# Patient Record
Sex: Male | Born: 1995 | Race: Black or African American | Hispanic: No | Marital: Single | Smoking: Current every day smoker
Health system: Southern US, Community
[De-identification: ages and names within clinical notes are randomized; demographics above are authoritative.]

## PROBLEM LIST (undated history)

## (undated) DIAGNOSIS — F319 Bipolar disorder, unspecified: Secondary | ICD-10-CM

---
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
## 2016-05-16 ENCOUNTER — Encounter (HOSPITAL_COMMUNITY): Payer: Self-pay | Admitting: *Deleted

## 2016-05-16 ENCOUNTER — Emergency Department (HOSPITAL_COMMUNITY): Payer: Medicaid Other

## 2016-05-16 ENCOUNTER — Emergency Department (HOSPITAL_COMMUNITY)
Admission: EM | Admit: 2016-05-16 | Discharge: 2016-05-16 | Disposition: A | Discharge status: Home / Self Care | Payer: Medicaid Other | Attending: Emergency Medicine | Admitting: Emergency Medicine

## 2016-05-16 DIAGNOSIS — Y999 Unspecified external cause status: Secondary | ICD-10-CM | POA: Insufficient documentation

## 2016-05-16 DIAGNOSIS — S4991XA Unspecified injury of right shoulder and upper arm, initial encounter: Secondary | ICD-10-CM | POA: Diagnosis present

## 2016-05-16 DIAGNOSIS — S42001A Fracture of unspecified part of right clavicle, initial encounter for closed fracture: Secondary | ICD-10-CM

## 2016-05-16 DIAGNOSIS — Y929 Unspecified place or not applicable: Secondary | ICD-10-CM | POA: Diagnosis not present

## 2016-05-16 DIAGNOSIS — W1830XA Fall on same level, unspecified, initial encounter: Secondary | ICD-10-CM | POA: Diagnosis not present

## 2016-05-16 DIAGNOSIS — F17210 Nicotine dependence, cigarettes, uncomplicated: Secondary | ICD-10-CM | POA: Diagnosis not present

## 2016-05-16 DIAGNOSIS — S42011A Anterior displaced fracture of sternal end of right clavicle, initial encounter for closed fracture: Secondary | ICD-10-CM | POA: Diagnosis not present

## 2016-05-16 DIAGNOSIS — Y9389 Activity, other specified: Secondary | ICD-10-CM | POA: Insufficient documentation

## 2016-05-16 MED ORDER — IBUPROFEN 600 MG PO TABS: 600.0000 mg | ORAL_TABLET | Freq: Four times a day (QID) | ORAL | Status: DC | PRN

## 2016-05-16 MED ORDER — HYDROCODONE-ACETAMINOPHEN 5-325 MG PO TABS: 1.0000 | ORAL_TABLET | ORAL | Status: DC | PRN

## 2016-05-16 MED ORDER — OXYCODONE-ACETAMINOPHEN 5-325 MG PO TABS
1.0000 | ORAL_TABLET | ORAL | Status: DC | PRN
  Administered 2016-05-16: 1 via ORAL

## 2016-05-16 MED ORDER — OXYCODONE-ACETAMINOPHEN 5-325 MG PO TABS
ORAL_TABLET | ORAL | Status: AC
  Filled 2016-05-16: qty 1

## 2016-05-16 NOTE — ED Notes (Signed)
N Nadeau, PA, in w/pt. 

## 2016-05-16 NOTE — ED Notes (Signed)
Sitting on stretcher - no distress noted.
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
## 2016-05-16 NOTE — Discharge Instructions (Signed)
Take your medication as prescribed as needed for pain. I recommend resting, leading your right arm and the arm sling until follow-up with orthopedics and applying ice to affected area for 15-20 minutes 3-4 times daily to help with pain and swelling. Follow-up with the orthopedic office listed above within the next 3-5 days for follow-up. Return to the emergency department if symptoms worsen or new onset of fever, redness, swelling, warmth, numbness, tingling, weakness.

## 2017-08-16 ENCOUNTER — Emergency Department (HOSPITAL_COMMUNITY)
Admission: EM | Admit: 2017-08-16 | Discharge: 2017-08-16 | Disposition: A | Discharge status: Home / Self Care | Payer: Medicaid Other | Attending: Emergency Medicine | Admitting: Emergency Medicine

## 2017-08-16 DIAGNOSIS — S61210A Laceration without foreign body of right index finger without damage to nail, initial encounter: Secondary | ICD-10-CM | POA: Diagnosis not present

## 2017-08-16 DIAGNOSIS — S64498A Injury of digital nerve of other finger, initial encounter: Secondary | ICD-10-CM

## 2017-08-16 DIAGNOSIS — S56129A Laceration of flexor muscle, fascia and tendon of unspecified finger at forearm level, initial encounter: Secondary | ICD-10-CM

## 2017-08-16 DIAGNOSIS — F17210 Nicotine dependence, cigarettes, uncomplicated: Secondary | ICD-10-CM | POA: Insufficient documentation

## 2017-08-16 DIAGNOSIS — W271XXA Contact with garden tool, initial encounter: Secondary | ICD-10-CM | POA: Insufficient documentation

## 2017-08-16 DIAGNOSIS — Y999 Unspecified external cause status: Secondary | ICD-10-CM | POA: Insufficient documentation

## 2017-08-16 DIAGNOSIS — S66120A Laceration of flexor muscle, fascia and tendon of right index finger at wrist and hand level, initial encounter: Secondary | ICD-10-CM | POA: Insufficient documentation

## 2017-08-16 DIAGNOSIS — Y929 Unspecified place or not applicable: Secondary | ICD-10-CM | POA: Insufficient documentation

## 2017-08-16 DIAGNOSIS — S64490A Injury of digital nerve of right index finger, initial encounter: Secondary | ICD-10-CM | POA: Diagnosis not present

## 2017-08-16 DIAGNOSIS — S60940A Unspecified superficial injury of right index finger, initial encounter: Secondary | ICD-10-CM | POA: Diagnosis present

## 2017-08-16 DIAGNOSIS — Y9389 Activity, other specified: Secondary | ICD-10-CM | POA: Insufficient documentation

## 2017-08-16 DIAGNOSIS — S61209A Unspecified open wound of unspecified finger without damage to nail, initial encounter: Secondary | ICD-10-CM

## 2017-08-16 MED ORDER — CEPHALEXIN 500 MG PO CAPS: 500.0000 mg | ORAL_CAPSULE | Freq: Three times a day (TID) | ORAL | 0 refills | Status: DC

## 2017-08-16 MED ORDER — TRAMADOL HCL 50 MG PO TABS: 50.0000 mg | ORAL_TABLET | Freq: Four times a day (QID) | ORAL | 0 refills | Status: DC | PRN

## 2017-08-16 MED ORDER — IBUPROFEN 600 MG PO TABS: 600.0000 mg | ORAL_TABLET | Freq: Four times a day (QID) | ORAL | 0 refills | Status: DC | PRN

## 2017-08-16 NOTE — Discharge Instructions (Signed)
See Dr Melvyn Novas this week.

## 2017-08-16 NOTE — ED Triage Notes (Signed)
Pt cut his finger 3 days ago on a blade while he wa scarping concrete off of equipment, pts finger is swollen just below the knuckle.

## 2017-08-16 NOTE — ED Provider Notes (Signed)
[facility] EMERGENCY DEPARTMENT Provider Note   CSN: [id number] Arrival date & time: 08/16/17  1449     History   Chief Complaint Chief Complaint  Patient presents with  . Finger Injury    HPI Jeremy [name] is a 21 y.o. male.  HPI  21 year old male with finger laceration. Right index finger. Happened 3 days ago. He is cleaning asphalt off the shovel with metal scraper. It slipped and he cut his finger. He is presenting today because of continued pain. Is having difficulty moving his finger distal to the injury. Reports some decreased sensation. No drainage. No fevers or chills. No swelling.  Past Medical History:  Diagnosis Date  . Bipolar 1 disorder [facility])     Patient Active Problem List   Diagnosis Date Noted  . Acute blood loss anemia 06/29/2014  . Arm laceration with complication 06/28/2014    Past Surgical History:  Procedure Laterality Date  . ARTERY REPAIR Left 06/28/2014   Procedure: BRACHIAL ARTERY EXPLORATION;  Surgeon: Nada Libman, MD;  Location: [facility] OR;  Service: Vascular;  Laterality: Left;       Home Medications    Prior to Admission medications   Medication Sig Start Date End Date Taking? Authorizing Provider  HYDROcodone-acetaminophen (NORCO/VICODIN) 5-325 MG tablet Take 1-2 tablets by mouth every 4 (four) hours as needed. 05/16/16   Barrett Henle, PA-C  ibuprofen (ADVIL,MOTRIN) 600 MG tablet Take 1 tablet (600 mg total) by mouth every 6 (six) hours as needed. 05/16/16   Barrett Henle, PA-C  traMADol (ULTRAM) 50 MG tablet Take 1-2 tablets (50-100 mg total) by mouth every 6 (six) hours as needed (Pain). 06/29/14   Freeman Caldron, PA-C    Family History No family history on file.  Social History Social History  Substance Use Topics  . Smoking status: Current Every Day Smoker    Packs/day: 0.50    Types: Cigarettes  . Smokeless tobacco: Not on file  . Alcohol use Yes     Allergies   Patient  has no known allergies.   Review of Systems Review of Systems  All systems reviewed and negative, other than as noted in HPI.  Physical Exam Updated Vital Signs BP 105/64 (BP Location: Right Arm)   Pulse 96   Temp (!) 97.5 F (36.4 C) (Oral)   Resp 16   Ht 6' (1.829 m)   Wt 68 kg (150 lb)   SpO2 98%   BMI 20.34 kg/m   Physical Exam  Constitutional: He appears well-developed and well-nourished. No distress.  HENT:  Head: Normocephalic and atraumatic.  Eyes: Conjunctivae are normal. Right eye exhibits no discharge. Left eye exhibits no discharge.  Neck: Neck supple.  Cardiovascular: Normal rate, regular rhythm and normal heart sounds.  Exam reveals no gallop and no friction rub.   No murmur heard. Pulmonary/Chest: Effort normal and breath sounds normal. No respiratory distress.  Abdominal: Soft. He exhibits no distension. There is no tenderness.  Musculoskeletal: He exhibits no edema or tenderness.  3 large subacute appearing laceration to his right index finger. Flat. Laceration extends along the palmar aspect around to the dorsal surface along the radial aspect. No active bleeding. He cannot flex the DIP. He reports decreased sensation along the radial aspect. Good cap refill in the fingertip.  Neurological: He is alert.  Skin: Skin is warm and dry.  Psychiatric: He has a normal mood and affect. His behavior is normal. Thought content normal.  Nursing note  and vitals reviewed.    ED Treatments / Results  Labs (all labs ordered are listed, but only abnormal results are displayed) Labs Reviewed - No data to display  EKG  EKG Interpretation None       Radiology No results found.  Procedures Procedures (including critical care time)  Medications Ordered in ED Medications - No data to display   Initial Impression / Assessment and Plan / ED Course  I have reviewed the triage vital signs and the nursing notes.  Pertinent labs & imaging results that were  available during my care of the patient were reviewed by me and considered in my medical decision making (see chart for details).     21yM with pretty large laceration to R index finger. 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  Clinically he has flexor tendon injury and digital nerve injury. Happened 3d ago and wound already granulating. Discussed that he has to see hand surgeon this week. Wound care PRN pain meds.   Final Clinical Impressions(s) / ED Diagnoses   Final diagnoses:  Laceration of right index finger without foreign body without damage to nail, initial encounter  Flexor tendon laceration of finger with open wound, initial encounter  Injury of digital nerve of index finger, initial encounter    New Prescriptions New Prescriptions   No medications on file     Raeford Razor, MD 08/18/17 1159

## 2018-12-12 ENCOUNTER — Emergency Department (HOSPITAL_COMMUNITY)
Admission: EM | Admit: 2018-12-12 | Discharge: 2018-12-12 | Discharge status: Against Medical Advice | Payer: Medicaid Other

## 2018-12-12 NOTE — ED Notes (Signed)
Pt refused to allow [place] to gather vitals saying " I need to leave I need to get to my daughter." Pt became agitated and continued to say that he needed to leave and would not allow treatment. Pt then started to walk toward the nurse station repeating he was going to leave. Pt began to have a threatening posture and tone toward [employer]. I then told pt that we can not hold him and if he would like to leave he could, however if he threatened the nurse security would be called. The pt then left and offduty [employer] directed the pt to the exit. Pt had a steady gait, appeared reasonably aware of situation/surroundings while leaving ED.

## 2019-02-18 ENCOUNTER — Emergency Department (HOSPITAL_COMMUNITY): Payer: Medicaid Other | Admitting: Certified Registered Nurse Anesthetist

## 2019-02-18 ENCOUNTER — Emergency Department (HOSPITAL_COMMUNITY): Payer: Medicaid Other

## 2019-02-18 ENCOUNTER — Inpatient Hospital Stay (HOSPITAL_COMMUNITY)
Admission: EM | Admit: 2019-02-18 | Discharge: 2019-03-11 | DRG: 405 | Discharge status: Against Medical Advice | Payer: Medicaid Other | Attending: General Surgery | Admitting: General Surgery

## 2019-02-18 ENCOUNTER — Encounter (HOSPITAL_COMMUNITY): Admission: EM | Discharge status: Against Medical Advice | Payer: Self-pay | Source: Home / Self Care

## 2019-02-18 DIAGNOSIS — Y92410 Unspecified street and highway as the place of occurrence of the external cause: Secondary | ICD-10-CM | POA: Diagnosis not present

## 2019-02-18 DIAGNOSIS — J9601 Acute respiratory failure with hypoxia: Secondary | ICD-10-CM | POA: Diagnosis present

## 2019-02-18 DIAGNOSIS — D62 Acute posthemorrhagic anemia: Secondary | ICD-10-CM | POA: Diagnosis present

## 2019-02-18 DIAGNOSIS — S36116A Major laceration of liver, initial encounter: Secondary | ICD-10-CM | POA: Diagnosis present

## 2019-02-18 DIAGNOSIS — R402432 Glasgow coma scale score 3-8, at arrival to emergency department: Secondary | ICD-10-CM | POA: Diagnosis present

## 2019-02-18 DIAGNOSIS — R1310 Dysphagia, unspecified: Secondary | ICD-10-CM | POA: Diagnosis present

## 2019-02-18 DIAGNOSIS — S2242XA Multiple fractures of ribs, left side, initial encounter for closed fracture: Secondary | ICD-10-CM | POA: Diagnosis present

## 2019-02-18 DIAGNOSIS — S0083XA Contusion of other part of head, initial encounter: Secondary | ICD-10-CM | POA: Diagnosis present

## 2019-02-18 DIAGNOSIS — E876 Hypokalemia: Secondary | ICD-10-CM | POA: Diagnosis not present

## 2019-02-18 DIAGNOSIS — F10239 Alcohol dependence with withdrawal, unspecified: Secondary | ICD-10-CM | POA: Diagnosis not present

## 2019-02-18 DIAGNOSIS — R571 Hypovolemic shock: Secondary | ICD-10-CM | POA: Diagnosis present

## 2019-02-18 DIAGNOSIS — Z978 Presence of other specified devices: Secondary | ICD-10-CM

## 2019-02-18 DIAGNOSIS — F319 Bipolar disorder, unspecified: Secondary | ICD-10-CM | POA: Diagnosis present

## 2019-02-18 DIAGNOSIS — I9589 Other hypotension: Secondary | ICD-10-CM | POA: Diagnosis present

## 2019-02-18 DIAGNOSIS — T17908A Unspecified foreign body in respiratory tract, part unspecified causing other injury, initial encounter: Secondary | ICD-10-CM

## 2019-02-18 DIAGNOSIS — E861 Hypovolemia: Secondary | ICD-10-CM

## 2019-02-18 DIAGNOSIS — Z1159 Encounter for screening for other viral diseases: Secondary | ICD-10-CM | POA: Diagnosis not present

## 2019-02-18 DIAGNOSIS — J189 Pneumonia, unspecified organism: Secondary | ICD-10-CM

## 2019-02-18 DIAGNOSIS — S01511A Laceration without foreign body of lip, initial encounter: Secondary | ICD-10-CM | POA: Diagnosis present

## 2019-02-18 DIAGNOSIS — J690 Pneumonitis due to inhalation of food and vomit: Secondary | ICD-10-CM | POA: Diagnosis not present

## 2019-02-18 DIAGNOSIS — K661 Hemoperitoneum: Secondary | ICD-10-CM

## 2019-02-18 DIAGNOSIS — T82898A Other specified complication of vascular prosthetic devices, implants and grafts, initial encounter: Secondary | ICD-10-CM

## 2019-02-18 DIAGNOSIS — R4689 Other symptoms and signs involving appearance and behavior: Secondary | ICD-10-CM

## 2019-02-18 DIAGNOSIS — Z781 Physical restraint status: Secondary | ICD-10-CM | POA: Diagnosis not present

## 2019-02-18 DIAGNOSIS — S36113A Laceration of liver, unspecified degree, initial encounter: Secondary | ICD-10-CM | POA: Diagnosis present

## 2019-02-18 DIAGNOSIS — Z01818 Encounter for other preprocedural examination: Secondary | ICD-10-CM

## 2019-02-18 DIAGNOSIS — S069X9A Unspecified intracranial injury with loss of consciousness of unspecified duration, initial encounter: Secondary | ICD-10-CM | POA: Diagnosis not present

## 2019-02-18 DIAGNOSIS — J9690 Respiratory failure, unspecified, unspecified whether with hypoxia or hypercapnia: Secondary | ICD-10-CM

## 2019-02-18 DIAGNOSIS — T1490XA Injury, unspecified, initial encounter: Secondary | ICD-10-CM

## 2019-02-18 DIAGNOSIS — Z9289 Personal history of other medical treatment: Secondary | ICD-10-CM

## 2019-02-18 HISTORY — PX: LAPAROTOMY: SHX154

## 2019-02-18 LAB — PROTIME-INR
INR: 1.1 (ref 0.8–1.2)
Prothrombin Time: 13.8 seconds (ref 11.4–15.2)

## 2019-02-18 LAB — CBC
HCT: 38.2 % — ABNORMAL LOW (ref 39.0–52.0)
Hemoglobin: 12.2 g/dL — ABNORMAL LOW (ref 13.0–17.0)
MCH: 32.7 pg (ref 26.0–34.0)
MCHC: 31.9 g/dL (ref 30.0–36.0)
MCV: 102.4 fL — ABNORMAL HIGH (ref 80.0–100.0)
Platelets: 247 10*3/uL (ref 150–400)
RBC: 3.73 MIL/uL — ABNORMAL LOW (ref 4.22–5.81)
RDW: 13.2 % (ref 11.5–15.5)
WBC: 17.9 10*3/uL — ABNORMAL HIGH (ref 4.0–10.5)
nRBC: 0 % (ref 0.0–0.2)

## 2019-02-18 LAB — CBG MONITORING, ED: Glucose-Capillary: 189 mg/dL — ABNORMAL HIGH (ref 70–99)

## 2019-02-18 LAB — CDS SEROLOGY

## 2019-02-18 LAB — ETHANOL: Alcohol, Ethyl (B): 261 mg/dL — ABNORMAL HIGH (ref <10)

## 2019-02-18 SURGERY — LAPAROTOMY, EXPLORATORY
Anesthesia: General | Site: Abdomen

## 2019-02-18 MED ORDER — SUCCINYLCHOLINE CHLORIDE 20 MG/ML IJ SOLN
INTRAMUSCULAR | Status: AC | PRN
  Administered 2019-02-18: 100 mg via INTRAVENOUS

## 2019-02-18 MED ORDER — FENTANYL CITRATE (PF) 250 MCG/5ML IJ SOLN
INTRAMUSCULAR | Status: AC
  Filled 2019-02-18: qty 5

## 2019-02-18 MED ORDER — SODIUM CHLORIDE 0.9 % IV SOLN
INTRAVENOUS | Status: AC | PRN
  Administered 2019-02-18: 1000 mL via INTRAVENOUS

## 2019-02-18 MED ORDER — LACTATED RINGERS IV SOLN
INTRAVENOUS | Status: DC | PRN
  Administered 2019-02-18: 23:00:00 via INTRAVENOUS

## 2019-02-18 MED ORDER — ROCURONIUM BROMIDE 10 MG/ML (PF) SYRINGE
PREFILLED_SYRINGE | INTRAVENOUS | Status: DC | PRN
  Administered 2019-02-18 – 2019-02-19 (×3): 50 mg via INTRAVENOUS

## 2019-02-18 MED ORDER — SODIUM CHLORIDE 0.9 % IV SOLN
INTRAVENOUS | Status: DC | PRN
  Administered 2019-02-18: 23:00:00 via INTRAVENOUS

## 2019-02-18 MED ORDER — SODIUM BICARBONATE 8.4 % IV SOLN
INTRAVENOUS | Status: AC
  Filled 2019-02-18: qty 50

## 2019-02-18 MED ORDER — FENTANYL CITRATE (PF) 250 MCG/5ML IJ SOLN
INTRAMUSCULAR | Status: DC | PRN
  Administered 2019-02-18 (×2): 100 ug via INTRAVENOUS
  Administered 2019-02-18: 50 ug via INTRAVENOUS

## 2019-02-18 MED ORDER — SODIUM BICARBONATE 8.4 % IV SOLN
INTRAVENOUS | Status: DC | PRN
  Administered 2019-02-18 (×2): 50 mL via INTRAVENOUS

## 2019-02-18 MED ORDER — DEXTROSE 5 % IV SOLN
INTRAVENOUS | Status: DC | PRN
  Administered 2019-02-18: 3 g via INTRAVENOUS

## 2019-02-18 MED ORDER — ROCURONIUM BROMIDE 50 MG/5ML IV SOSY
PREFILLED_SYRINGE | INTRAVENOUS | Status: AC
  Filled 2019-02-18: qty 10

## 2019-02-18 MED ORDER — PROPOFOL 1000 MG/100ML IV EMUL
INTRAVENOUS | Status: AC | PRN
  Administered 2019-02-18: 40 ug/kg/min via INTRAVENOUS

## 2019-02-18 MED ORDER — PROPOFOL 1000 MG/100ML IV EMUL
INTRAVENOUS | Status: AC
  Filled 2019-02-18: qty 100

## 2019-02-18 MED ORDER — ETOMIDATE 2 MG/ML IV SOLN
INTRAVENOUS | Status: AC | PRN
  Administered 2019-02-18: 30 mg via INTRAVENOUS

## 2019-02-18 SURGICAL SUPPLY — 33 items
BLADE CLIPPER SURG (BLADE) ×2 IMPLANT
CANISTER WOUNDNEG PRESSURE 500 (CANNISTER) ×2 IMPLANT
CHLORAPREP W/TINT 26ML (MISCELLANEOUS) ×3 IMPLANT
COVER SURGICAL LIGHT HANDLE (MISCELLANEOUS) ×5 IMPLANT
DRAPE LAPAROSCOPIC ABDOMINAL (DRAPES) ×3 IMPLANT
DRAPE WARM FLUID 44X44 (DRAPE) ×3 IMPLANT
ELECT CAUTERY BLADE 6.4 (BLADE) ×3 IMPLANT
ELECT REM PT RETURN 9FT ADLT (ELECTROSURGICAL) ×3
ELECTRODE REM PT RTRN 9FT ADLT (ELECTROSURGICAL) ×1 IMPLANT
GLOVE BIO SURGEON STRL SZ8 (GLOVE) ×5 IMPLANT
GLOVE BIOGEL PI IND STRL 8 (GLOVE) ×1 IMPLANT
GLOVE BIOGEL PI INDICATOR 8 (GLOVE) ×2
GOWN STRL REUS W/ TWL LRG LVL3 (GOWN DISPOSABLE) ×1 IMPLANT
GOWN STRL REUS W/ TWL XL LVL3 (GOWN DISPOSABLE) ×1 IMPLANT
GOWN STRL REUS W/TWL LRG LVL3 (GOWN DISPOSABLE) ×3
GOWN STRL REUS W/TWL XL LVL3 (GOWN DISPOSABLE) ×3
HEMOSTAT SNOW SURGICEL 2X4 (HEMOSTASIS) ×14 IMPLANT
KIT BASIN OR (CUSTOM PROCEDURE TRAY) ×3 IMPLANT
KIT TURNOVER KIT B (KITS) ×3 IMPLANT
NS IRRIG 1000ML POUR BTL (IV SOLUTION) ×6 IMPLANT
PACK GENERAL/GYN (CUSTOM PROCEDURE TRAY) ×3 IMPLANT
PAD ARMBOARD 7.5X6 YLW CONV (MISCELLANEOUS) ×3 IMPLANT
PENCIL SMOKE EVACUATOR (MISCELLANEOUS) ×3 IMPLANT
SPONGE ABD ABTHERA ADVANCE (MISCELLANEOUS) ×2 IMPLANT
SPONGE LAP 18X18 RF (DISPOSABLE) ×18 IMPLANT
SUCTION POOLE TIP (SUCTIONS) ×3 IMPLANT
SUT CHROMIC 0 BP (SUTURE) ×4 IMPLANT
SUT SILK 2 0 TIES 10X30 (SUTURE) ×2 IMPLANT
SUT SILK 3 0 TIES 10X30 (SUTURE) ×2 IMPLANT
TOWEL OR 17X24 6PK STRL BLUE (TOWEL DISPOSABLE) ×3 IMPLANT
TOWEL OR 17X26 10 PK STRL BLUE (TOWEL DISPOSABLE) ×3 IMPLANT
TRAY FOLEY MTR SLVR 16FR STAT (SET/KITS/TRAYS/PACK) ×2 IMPLANT
YANKAUER SUCT BULB TIP NO VENT (SUCTIONS) ×2 IMPLANT
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
## 2019-02-18 NOTE — Anesthesia Procedure Notes (Signed)
Arterial Line Insertion Start/End4/18/2020 10:15 PM Performed by: Mayer Camel, CRNA, CRNA  Patient location: OR. Preanesthetic checklist: patient identified, IV checked, site marked, risks and benefits discussed, surgical consent, monitors and equipment checked, pre-op evaluation, timeout performed and anesthesia consent Left, radial was placed Catheter size: 20 G Hand hygiene performed  and maximum sterile barriers used   Attempts: 1 Procedure performed without using ultrasound guided technique. Following insertion, dressing applied and Biopatch. Post procedure assessment: normal and unchanged  Patient tolerated the procedure well with no immediate complications.
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
## 2019-02-18 NOTE — ED Notes (Signed)
MTP activated by EDP Abran Duke MD, blood bank notified
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
## 2019-02-18 NOTE — ED Notes (Signed)
Pt taken to OR.

## 2019-02-18 NOTE — ED Notes (Signed)
Pts O2 saturations dropped to 70% on the Vent.  ED resident removed pt from vent and began to bag patient. Respiratory therapist en route

## 2019-02-19 ENCOUNTER — Inpatient Hospital Stay (HOSPITAL_COMMUNITY): Payer: Medicaid Other

## 2019-02-19 ENCOUNTER — Encounter (HOSPITAL_COMMUNITY): Payer: Self-pay | Admitting: Radiology

## 2019-02-19 DIAGNOSIS — S36113A Laceration of liver, unspecified degree, initial encounter: Secondary | ICD-10-CM | POA: Diagnosis present

## 2019-02-19 LAB — BPAM PLATELET PHERESIS
Blood Product Expiration Date: 202004192359
ISSUE DATE / TIME: 202004182245
Unit Type and Rh: 6200

## 2019-02-19 LAB — RESPIRATORY PANEL BY PCR

## 2019-02-19 LAB — POCT I-STAT 7, (LYTES, BLD GAS, ICA,H+H)
Acid-base deficit: 9 mmol/L — ABNORMAL HIGH (ref 0.0–2.0)
Bicarbonate: 18.2 mmol/L — ABNORMAL LOW (ref 20.0–28.0)
Calcium, Ion: 0.9 mmol/L — ABNORMAL LOW (ref 1.15–1.40)
HCT: 36 % — ABNORMAL LOW (ref 39.0–52.0)
Hemoglobin: 12.2 g/dL — ABNORMAL LOW (ref 13.0–17.0)
O2 Saturation: 97 %
Patient temperature: 36.5
Potassium: 3.4 mmol/L — ABNORMAL LOW (ref 3.5–5.1)
Sodium: 143 mmol/L (ref 135–145)
TCO2: 20 mmol/L — ABNORMAL LOW (ref 22–32)
pCO2 arterial: 44.1 mmHg (ref 32.0–48.0)
pH, Arterial: 7.221 — ABNORMAL LOW (ref 7.350–7.450)
pO2, Arterial: 110 mmHg — ABNORMAL HIGH (ref 83.0–108.0)

## 2019-02-19 LAB — PREPARE FRESH FROZEN PLASMA
Unit division: 0
Unit division: 0
Unit division: 0
Unit division: 0
Unit division: 0
Unit division: 0
Unit division: 0
Unit division: 0

## 2019-02-19 LAB — PREPARE PLATELET PHERESIS: Unit division: 0

## 2019-02-19 LAB — PREPARE CRYOPRECIPITATE: Unit division: 0

## 2019-02-19 LAB — BPAM FFP
Blood Product Expiration Date: 202004212359
Blood Product Expiration Date: 202004212359
Blood Product Expiration Date: 202004222359
Blood Product Expiration Date: 202004232359
Blood Product Expiration Date: 202005092359
Blood Product Expiration Date: 202005092359
Blood Product Expiration Date: 202005092359
Blood Product Expiration Date: 202005092359
ISSUE DATE / TIME: 202004182213
ISSUE DATE / TIME: 202004182213
ISSUE DATE / TIME: 202004182236
ISSUE DATE / TIME: 202004182236
ISSUE DATE / TIME: 202004182245
ISSUE DATE / TIME: 202004182245
ISSUE DATE / TIME: 202004182303
ISSUE DATE / TIME: 202004190549
Unit Type and Rh: 600
Unit Type and Rh: 600
Unit Type and Rh: 6200
Unit Type and Rh: 6200
Unit Type and Rh: 6200
Unit Type and Rh: 6200
Unit Type and Rh: 6200
Unit Type and Rh: 6200

## 2019-02-19 LAB — URINALYSIS, ROUTINE W REFLEX MICROSCOPIC
Bilirubin Urine: NEGATIVE
Glucose, UA: NEGATIVE mg/dL
Hgb urine dipstick: NEGATIVE
Ketones, ur: NEGATIVE mg/dL
Leukocytes,Ua: NEGATIVE
Nitrite: NEGATIVE
Protein, ur: NEGATIVE mg/dL
Specific Gravity, Urine: 1.021 (ref 1.005–1.030)
pH: 5 (ref 5.0–8.0)

## 2019-02-19 LAB — BLOOD GAS, ARTERIAL
Acid-base deficit: 3.5 mmol/L — ABNORMAL HIGH (ref 0.0–2.0)
Bicarbonate: 22.1 mmol/L (ref 20.0–28.0)
FIO2: 100
MECHVT: 620 mL
O2 Saturation: 94.5 %
PEEP: 5 cmH2O
Patient temperature: 97.5
RATE: 16 resp/min
pCO2 arterial: 46.1 mmHg (ref 32.0–48.0)
pH, Arterial: 7.297 — ABNORMAL LOW (ref 7.350–7.450)
pO2, Arterial: 79 mmHg — ABNORMAL LOW (ref 83.0–108.0)

## 2019-02-19 LAB — CBC
HCT: 37.6 % — ABNORMAL LOW (ref 39.0–52.0)
Hemoglobin: 12.6 g/dL — ABNORMAL LOW (ref 13.0–17.0)
MCH: 32.2 pg (ref 26.0–34.0)
MCHC: 33.5 g/dL (ref 30.0–36.0)
MCV: 96.2 fL (ref 80.0–100.0)
Platelets: 180 10*3/uL (ref 150–400)
RBC: 3.91 MIL/uL — ABNORMAL LOW (ref 4.22–5.81)
RDW: 15.2 % (ref 11.5–15.5)
WBC: 8.8 10*3/uL (ref 4.0–10.5)
nRBC: 0 % (ref 0.0–0.2)

## 2019-02-19 LAB — GLUCOSE, CAPILLARY
Glucose-Capillary: 106 mg/dL — ABNORMAL HIGH (ref 70–99)
Glucose-Capillary: 111 mg/dL — ABNORMAL HIGH (ref 70–99)
Glucose-Capillary: 112 mg/dL — ABNORMAL HIGH (ref 70–99)
Glucose-Capillary: 81 mg/dL (ref 70–99)
Glucose-Capillary: 83 mg/dL (ref 70–99)
Glucose-Capillary: 96 mg/dL (ref 70–99)

## 2019-02-19 LAB — INFLUENZA PANEL BY PCR (TYPE A & B)
Influenza A By PCR: NEGATIVE
Influenza B By PCR: NEGATIVE

## 2019-02-19 LAB — BPAM CRYOPRECIPITATE
Blood Product Expiration Date: 202004190445
ISSUE DATE / TIME: 202004182303
Unit Type and Rh: 6200

## 2019-02-19 LAB — TRIGLYCERIDES: Triglycerides: 69 mg/dL (ref <150)

## 2019-02-19 LAB — SARS CORONAVIRUS 2 BY RT PCR (HOSPITAL ORDER, PERFORMED IN ~~LOC~~ HOSPITAL LAB)
SARS Coronavirus 2: NEGATIVE
SARS Coronavirus 2: NEGATIVE

## 2019-02-19 LAB — MRSA PCR SCREENING: MRSA by PCR: NEGATIVE

## 2019-02-19 LAB — LACTIC ACID, PLASMA: Lactic Acid, Venous: 3.2 mmol/L (ref 0.5–1.9)

## 2019-02-19 MED ORDER — HYDROMORPHONE HCL 1 MG/ML IJ SOLN
1.0000 mg | INTRAMUSCULAR | Status: DC | PRN
  Administered 2019-02-19 – 2019-03-08 (×28): 1 mg via INTRAVENOUS
  Filled 2019-02-19 (×30): qty 1

## 2019-02-19 MED ORDER — SODIUM CHLORIDE (PF) 0.9 % IJ SOLN
INTRAMUSCULAR | Status: AC
  Filled 2019-02-19: qty 20

## 2019-02-19 MED ORDER — DEXTROSE-NACL 5-0.9 % IV SOLN
INTRAVENOUS | Status: DC
  Administered 2019-02-19 – 2019-03-05 (×20): via INTRAVENOUS

## 2019-02-19 MED ORDER — IOHEXOL 300 MG/ML  SOLN
100.0000 mL | Freq: Once | INTRAMUSCULAR | Status: AC | PRN
  Administered 2019-02-19: 100 mL via INTRAVENOUS

## 2019-02-19 MED ORDER — FENTANYL CITRATE (PF) 100 MCG/2ML IJ SOLN
INTRAMUSCULAR | Status: AC
  Administered 2019-02-19: 100 ug via INTRAVENOUS
  Filled 2019-02-19: qty 2

## 2019-02-19 MED ORDER — FENTANYL 2500MCG IN NS 250ML (10MCG/ML) PREMIX INFUSION
0.0000 ug/h | INTRAVENOUS | Status: DC
  Administered 2019-02-19: 150 ug/h via INTRAVENOUS
  Administered 2019-02-19: 250 ug/h via INTRAVENOUS
  Administered 2019-02-20 (×3): 225 ug/h via INTRAVENOUS
  Administered 2019-02-21: 18:00:00 275 ug/h via INTRAVENOUS
  Administered 2019-02-21: 05:00:00 225 ug/h via INTRAVENOUS
  Administered 2019-02-21: 200 ug/h via INTRAVENOUS
  Administered 2019-02-22: 250 ug/h via INTRAVENOUS
  Administered 2019-02-22: 11:00:00 300 ug/h via INTRAVENOUS
  Administered 2019-02-22: 19:00:00 400 ug/h via INTRAVENOUS
  Administered 2019-02-23: 08:00:00 300 ug/h via INTRAVENOUS
  Administered 2019-02-23: 375 ug/h via INTRAVENOUS
  Filled 2019-02-19 (×12): qty 250

## 2019-02-19 MED ORDER — CHLORHEXIDINE GLUCONATE CLOTH 2 % EX PADS
6.0000 | MEDICATED_PAD | Freq: Every day | CUTANEOUS | Status: DC
  Administered 2019-02-19 – 2019-02-20 (×2): 6 via TOPICAL

## 2019-02-19 MED ORDER — FENTANYL CITRATE (PF) 100 MCG/2ML IJ SOLN
100.0000 ug | Freq: Once | INTRAMUSCULAR | Status: AC
  Administered 2019-02-19: 03:00:00 100 ug via INTRAVENOUS

## 2019-02-19 MED ORDER — CHLORHEXIDINE GLUCONATE 0.12% ORAL RINSE (MEDLINE KIT)
15.0000 mL | Freq: Two times a day (BID) | OROMUCOSAL | Status: DC
  Administered 2019-02-19 – 2019-03-01 (×17): 15 mL via OROMUCOSAL

## 2019-02-19 MED ORDER — ORAL CARE MOUTH RINSE
15.0000 mL | OROMUCOSAL | Status: DC
  Administered 2019-02-19 – 2019-02-23 (×44): 15 mL via OROMUCOSAL

## 2019-02-19 MED ORDER — METOPROLOL TARTRATE 5 MG/5ML IV SOLN
5.0000 mg | Freq: Four times a day (QID) | INTRAVENOUS | Status: AC | PRN
  Administered 2019-02-19 – 2019-02-24 (×4): 5 mg via INTRAVENOUS
  Filled 2019-02-19 (×6): qty 5

## 2019-02-19 MED ORDER — 0.9 % SODIUM CHLORIDE (POUR BTL) OPTIME
TOPICAL | Status: DC | PRN
  Administered 2019-02-18: 1000 mL

## 2019-02-19 MED ORDER — PROPOFOL 10 MG/ML IV BOLUS
INTRAVENOUS | Status: AC
  Filled 2019-02-19: qty 20

## 2019-02-19 MED ORDER — PROPOFOL 500 MG/50ML IV EMUL
INTRAVENOUS | Status: DC | PRN
  Administered 2019-02-19: 40 ug/kg/min via INTRAVENOUS

## 2019-02-19 MED ORDER — PANTOPRAZOLE SODIUM 40 MG IV SOLR
40.0000 mg | Freq: Every day | INTRAVENOUS | Status: DC
  Administered 2019-02-19 – 2019-02-28 (×10): 40 mg via INTRAVENOUS
  Filled 2019-02-19 (×13): qty 40

## 2019-02-19 MED ORDER — PROPOFOL 1000 MG/100ML IV EMUL
5.0000 ug/kg/min | INTRAVENOUS | Status: DC
  Administered 2019-02-19 (×6): 50 ug/kg/min via INTRAVENOUS
  Administered 2019-02-20 (×7): 60 ug/kg/min via INTRAVENOUS
  Administered 2019-02-20 (×2): 55 ug/kg/min via INTRAVENOUS
  Administered 2019-02-21 (×4): 60 ug/kg/min via INTRAVENOUS
  Administered 2019-02-21: 05:00:00 55 ug/kg/min via INTRAVENOUS
  Administered 2019-02-21: 19:00:00 80 ug/kg/min via INTRAVENOUS
  Administered 2019-02-21 (×2): 60 ug/kg/min via INTRAVENOUS
  Administered 2019-02-21: 09:00:00 50 ug/kg/min via INTRAVENOUS
  Administered 2019-02-22: 80 ug/kg/min via INTRAVENOUS
  Administered 2019-02-22 (×3): 70 ug/kg/min via INTRAVENOUS
  Administered 2019-02-22 (×3): 80 ug/kg/min via INTRAVENOUS
  Administered 2019-02-22 (×2): 70 ug/kg/min via INTRAVENOUS
  Administered 2019-02-23 (×2): 80 ug/kg/min via INTRAVENOUS
  Administered 2019-02-23: 08:00:00 60 ug/kg/min via INTRAVENOUS
  Filled 2019-02-19 (×22): qty 100
  Filled 2019-02-19: qty 200
  Filled 2019-02-19 (×6): qty 100
  Filled 2019-02-19: qty 200
  Filled 2019-02-19 (×7): qty 100
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
## 2019-02-19 NOTE — Transfer of Care (Signed)
Immediate Anesthesia Transfer of Care Note  Patient: Jeremy [name]  Procedure(s) Performed: EXPLORATORY LAPAROTOMY LIVER LACERATION REPAIR, APPLICATION WOUND VAC. (N/A Abdomen)  Patient Location: NICU  Anesthesia Type:General  Level of Consciousness: Patient remains intubated per anesthesia plan  Airway & Oxygen Therapy: Patient placed on Ventilator (see vital sign flow sheet for setting)  Post-op Assessment: Report given to RN and Post -op Vital signs reviewed and stable  Post vital signs: Reviewed and stable  Last Vitals:  Vitals Value Taken Time  BP 154/93 02/19/2019  1:05 AM  Temp    Pulse 88 02/19/2019  1:09 AM  Resp 16 02/19/2019  1:09 AM  SpO2 95 % 02/19/2019  1:09 AM  Vitals shown include unvalidated device data.  Last Pain:  Vitals:   02/18/19 2220  TempSrc: Temporal         Complications: No apparent anesthesia complications
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
## 2019-02-19 NOTE — Anesthesia Postprocedure Evaluation (Signed)
Anesthesia Post Note  Patient: Jeremy [name]  Procedure(s) Performed: EXPLORATORY LAPAROTOMY LIVER LACERATION REPAIR, APPLICATION WOUND VAC. (N/A Abdomen)     Patient location during evaluation: ICU Anesthesia Type: General Level of consciousness: patient remains intubated per anesthesia plan Pain management: pain level controlled Vital Signs Assessment: post-procedure vital signs reviewed and stable Respiratory status: patient remains intubated per anesthesia plan Cardiovascular status: stable Postop Assessment: no apparent nausea or vomiting Anesthetic complications: no    Last Vitals:  Vitals:   02/18/19 2248 02/19/19 0105  BP: (!) 80/59 (!) 154/93  Pulse: (!) 124 90  Resp: (!) 22 16  Temp:    SpO2: 93% 93%    Last Pain:  Vitals:   02/18/19 2220  TempSrc: Temporal                 Rasheem Figiel

## 2019-02-19 NOTE — Progress Notes (Signed)
Patient transported to CT and returned to 4N22 without complications.

## 2019-02-19 NOTE — Progress Notes (Signed)
RN notified by microbiology that SARS Covid test was negative. RN notified trauma MD. RN will continue to monitor.

## 2019-02-19 NOTE — Anesthesia Preprocedure Evaluation (Signed)
Anesthesia Evaluation  Patient identified by MRN, date of birth, ID band Patient unresponsive  Preop documentation limited or incomplete due to emergent nature of procedure.  Airway Mallampati: Intubated       Dental   Pulmonary    breath sounds clear to auscultation       Cardiovascular  Rhythm:Regular Rate:Normal     Neuro/Psych    GI/Hepatic   Endo/Other    Renal/GU      Musculoskeletal   Abdominal   Peds  Hematology   Anesthesia Other Findings   Reproductive/Obstetrics                             Anesthesia Physical Anesthesia Plan  ASA: I and emergent  Anesthesia Plan: General   Post-op Pain Management:    Induction: Intravenous  PONV Risk Score and Plan: Treatment may vary due to age or medical condition  Airway Management Planned: Oral ETT  Additional Equipment:   Intra-op Plan:   Post-operative Plan: Post-operative intubation/ventilation  Informed Consent:   Plan Discussed with: CRNA, Anesthesiologist and Surgeon  Anesthesia Plan Comments:         Anesthesia Quick Evaluation
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
## 2019-02-20 ENCOUNTER — Inpatient Hospital Stay (HOSPITAL_COMMUNITY): Payer: Medicaid Other

## 2019-02-20 ENCOUNTER — Inpatient Hospital Stay (HOSPITAL_COMMUNITY): Payer: Medicaid Other | Admitting: Certified Registered"

## 2019-02-20 ENCOUNTER — Encounter (HOSPITAL_COMMUNITY): Admission: EM | Discharge status: Against Medical Advice | Payer: Self-pay | Source: Home / Self Care

## 2019-02-20 ENCOUNTER — Encounter (HOSPITAL_COMMUNITY): Payer: Self-pay | Admitting: Surgery

## 2019-02-20 HISTORY — PX: APPLICATION OF WOUND VAC: SHX5189

## 2019-02-20 HISTORY — PX: LAPAROTOMY: SHX154

## 2019-02-20 LAB — GLUCOSE, CAPILLARY
Glucose-Capillary: 102 mg/dL — ABNORMAL HIGH (ref 70–99)
Glucose-Capillary: 82 mg/dL (ref 70–99)
Glucose-Capillary: 86 mg/dL (ref 70–99)
Glucose-Capillary: 87 mg/dL (ref 70–99)
Glucose-Capillary: 91 mg/dL (ref 70–99)
Glucose-Capillary: 97 mg/dL (ref 70–99)

## 2019-02-20 LAB — COMPREHENSIVE METABOLIC PANEL
ALT: 175 U/L — ABNORMAL HIGH (ref 0–44)
ALT: 263 U/L — ABNORMAL HIGH (ref 0–44)
AST: 335 U/L — ABNORMAL HIGH (ref 15–41)
AST: 666 U/L — ABNORMAL HIGH (ref 15–41)
Albumin: 3.2 g/dL — ABNORMAL LOW (ref 3.5–5.0)
Albumin: 2.9 g/dL — ABNORMAL LOW (ref 3.5–5.0)
Alkaline Phosphatase: 61 U/L (ref 38–126)
Alkaline Phosphatase: 62 U/L (ref 38–126)
Anion gap: 12 (ref 5–15)
Anion gap: 11 (ref 5–15)
BUN: 14 mg/dL (ref 6–20)
BUN: 12 mg/dL (ref 6–20)
CO2: 20 mmol/L — ABNORMAL LOW (ref 22–32)
CO2: 22 mmol/L (ref 22–32)
Calcium: 8 mg/dL — ABNORMAL LOW (ref 8.9–10.3)
Calcium: 7 mg/dL — ABNORMAL LOW (ref 8.9–10.3)
Chloride: 108 mmol/L (ref 98–111)
Chloride: 110 mmol/L (ref 98–111)
Creatinine, Ser: 1.22 mg/dL (ref 0.61–1.24)
Creatinine, Ser: 0.89 mg/dL (ref 0.61–1.24)
GFR calc Af Amer: 41 mL/min — ABNORMAL LOW (ref >60)
GFR calc Af Amer: >60 mL/min (ref >60)
GFR calc non Af Amer: 36 mL/min — ABNORMAL LOW (ref >60)
GFR calc non Af Amer: 52 mL/min — ABNORMAL LOW (ref >60)
Glucose, Bld: 122 mg/dL — ABNORMAL HIGH (ref 70–99)
Glucose, Bld: 137 mg/dL — ABNORMAL HIGH (ref 70–99)
Potassium: 3.3 mmol/L — ABNORMAL LOW (ref 3.5–5.1)
Potassium: 3.4 mmol/L — ABNORMAL LOW (ref 3.5–5.1)
Sodium: 140 mmol/L (ref 135–145)
Sodium: 143 mmol/L (ref 135–145)
Total Bilirubin: 0.5 mg/dL (ref 0.3–1.2)
Total Bilirubin: 1.1 mg/dL (ref 0.3–1.2)
Total Protein: 5.2 g/dL — ABNORMAL LOW (ref 6.5–8.1)
Total Protein: 4.9 g/dL — ABNORMAL LOW (ref 6.5–8.1)

## 2019-02-20 LAB — PREPARE RBC (CROSSMATCH)

## 2019-02-20 LAB — CBC
HCT: 38.3 % — ABNORMAL LOW (ref 39.0–52.0)
Hemoglobin: 12.1 g/dL — ABNORMAL LOW (ref 13.0–17.0)
MCH: 30.9 pg (ref 26.0–34.0)
MCHC: 31.6 g/dL (ref 30.0–36.0)
MCV: 98 fL (ref 80.0–100.0)
Platelets: 134 10*3/uL — ABNORMAL LOW (ref 150–400)
RBC: 3.91 MIL/uL — ABNORMAL LOW (ref 4.22–5.81)
RDW: 15.8 % — ABNORMAL HIGH (ref 11.5–15.5)
WBC: 9.3 10*3/uL (ref 4.0–10.5)
nRBC: 0 % (ref 0.0–0.2)

## 2019-02-20 LAB — BASIC METABOLIC PANEL
Anion gap: 8 (ref 5–15)
BUN: 8 mg/dL (ref 6–20)
CO2: 24 mmol/L (ref 22–32)
Calcium: 7.6 mg/dL — ABNORMAL LOW (ref 8.9–10.3)
Chloride: 107 mmol/L (ref 98–111)
Creatinine, Ser: 0.9 mg/dL (ref 0.61–1.24)
GFR calc Af Amer: 60 mL/min — ABNORMAL LOW (ref >60)
GFR calc non Af Amer: 52 mL/min — ABNORMAL LOW (ref >60)
Glucose, Bld: 106 mg/dL — ABNORMAL HIGH (ref 70–99)
Potassium: 3.8 mmol/L (ref 3.5–5.1)
Sodium: 139 mmol/L (ref 135–145)

## 2019-02-20 LAB — BLOOD PRODUCT ORDER (VERBAL) VERIFICATION

## 2019-02-20 LAB — ABO/RH: ABO/RH(D): A POS

## 2019-02-20 SURGERY — LAPAROTOMY, EXPLORATORY
Anesthesia: General | Site: Abdomen

## 2019-02-20 MED ORDER — MIDAZOLAM HCL 2 MG/2ML IJ SOLN
INTRAMUSCULAR | Status: AC
  Filled 2019-02-20: qty 2

## 2019-02-20 MED ORDER — LACTATED RINGERS IV SOLN
INTRAVENOUS | Status: DC | PRN
  Administered 2019-02-20: 09:00:00 via INTRAVENOUS

## 2019-02-20 MED ORDER — FENTANYL CITRATE (PF) 100 MCG/2ML IJ SOLN
INTRAMUSCULAR | Status: DC | PRN
  Administered 2019-02-20 (×5): 50 ug via INTRAVENOUS

## 2019-02-20 MED ORDER — FENTANYL CITRATE (PF) 250 MCG/5ML IJ SOLN
INTRAMUSCULAR | Status: AC
  Filled 2019-02-20: qty 5

## 2019-02-20 MED ORDER — CEFAZOLIN SODIUM-DEXTROSE 2-3 GM-%(50ML) IV SOLR
INTRAVENOUS | Status: DC | PRN
  Administered 2019-02-20: 2 g via INTRAVENOUS

## 2019-02-20 MED ORDER — PROPOFOL 10 MG/ML IV BOLUS
INTRAVENOUS | Status: AC
  Filled 2019-02-20: qty 20

## 2019-02-20 MED ORDER — 0.9 % SODIUM CHLORIDE (POUR BTL) OPTIME
TOPICAL | Status: DC | PRN
  Administered 2019-02-20: 4000 mL

## 2019-02-20 MED ORDER — SODIUM CHLORIDE 0.9% IV SOLUTION: Freq: Once | INTRAVENOUS | Status: DC

## 2019-02-20 MED ORDER — ROCURONIUM BROMIDE 10 MG/ML (PF) SYRINGE
PREFILLED_SYRINGE | INTRAVENOUS | Status: DC | PRN
  Administered 2019-02-20 (×2): 50 mg via INTRAVENOUS

## 2019-02-20 SURGICAL SUPPLY — 51 items
APL SKNCLS STERI-STRIP NONHPOA (GAUZE/BANDAGES/DRESSINGS) ×1
BENZOIN TINCTURE PRP APPL 2/3 (GAUZE/BANDAGES/DRESSINGS) ×2 IMPLANT
BLADE CLIPPER SURG (BLADE) IMPLANT
CANISTER SUCT 3000ML PPV (MISCELLANEOUS) ×3 IMPLANT
CANISTER WOUND CARE 500ML ATS (WOUND CARE) ×3 IMPLANT
CANISTER WOUNDNEG PRESSURE 500 (CANNISTER) ×2 IMPLANT
CHLORAPREP W/TINT 26ML (MISCELLANEOUS) ×1 IMPLANT
COVER SURGICAL LIGHT HANDLE (MISCELLANEOUS) ×3 IMPLANT
COVER WAND RF STERILE (DRAPES) ×1 IMPLANT
DRAPE LAPAROSCOPIC ABDOMINAL (DRAPES) ×3 IMPLANT
DRAPE WARM FLUID 44X44 (DRAPE) ×3 IMPLANT
DRSG OPSITE POSTOP 4X10 (GAUZE/BANDAGES/DRESSINGS) IMPLANT
DRSG OPSITE POSTOP 4X8 (GAUZE/BANDAGES/DRESSINGS) IMPLANT
DRSG VAC ATS LRG SENSATRAC (GAUZE/BANDAGES/DRESSINGS) IMPLANT
DRSG VERSA FOAM LRG 10X15 (GAUZE/BANDAGES/DRESSINGS) IMPLANT
ELECT BLADE 6.5 EXT (BLADE) IMPLANT
ELECT CAUTERY BLADE 6.4 (BLADE) ×3 IMPLANT
ELECT REM PT RETURN 9FT ADLT (ELECTROSURGICAL) ×3
ELECTRODE REM PT RTRN 9FT ADLT (ELECTROSURGICAL) ×1 IMPLANT
GLOVE BIO SURGEON STRL SZ8 (GLOVE) ×3 IMPLANT
GLOVE BIOGEL PI IND STRL 8 (GLOVE) ×1 IMPLANT
GLOVE BIOGEL PI INDICATOR 8 (GLOVE) ×4
GLOVE ECLIPSE 7.5 STRL STRAW (GLOVE) ×2 IMPLANT
GOWN STRL REUS W/ TWL LRG LVL3 (GOWN DISPOSABLE) ×1 IMPLANT
GOWN STRL REUS W/ TWL XL LVL3 (GOWN DISPOSABLE) ×1 IMPLANT
GOWN STRL REUS W/TWL LRG LVL3 (GOWN DISPOSABLE) ×3
GOWN STRL REUS W/TWL XL LVL3 (GOWN DISPOSABLE) ×3
KIT BASIN OR (CUSTOM PROCEDURE TRAY) ×3 IMPLANT
KIT TURNOVER KIT B (KITS) ×3 IMPLANT
LIGASURE IMPACT 36 18CM CVD LR (INSTRUMENTS) IMPLANT
NS IRRIG 1000ML POUR BTL (IV SOLUTION) ×6 IMPLANT
PACK GENERAL/GYN (CUSTOM PROCEDURE TRAY) ×3 IMPLANT
PAD ARMBOARD 7.5X6 YLW CONV (MISCELLANEOUS) ×5 IMPLANT
PENCIL SMOKE EVACUATOR (MISCELLANEOUS) ×3 IMPLANT
SLEEVE SUCTION CATH 165 (SLEEVE) ×2 IMPLANT
SPECIMEN JAR LARGE (MISCELLANEOUS) IMPLANT
SPONGE ABD ABTHERA ADVANCE (MISCELLANEOUS) ×2 IMPLANT
SPONGE ABDOMINAL VAC ABTHERA (MISCELLANEOUS) IMPLANT
SPONGE LAP 18X18 RF (DISPOSABLE) IMPLANT
STAPLER VISISTAT 35W (STAPLE) ×1 IMPLANT
SUCTION POOLE TIP (SUCTIONS) ×3 IMPLANT
SUT NOVA 1 T20/GS 25DT (SUTURE) ×4 IMPLANT
SUT PDS AB 1 TP1 96 (SUTURE) ×2 IMPLANT
SUT SILK 2 0 SH CR/8 (SUTURE) ×1 IMPLANT
SUT SILK 2 0 TIES 10X30 (SUTURE) ×1 IMPLANT
SUT SILK 3 0 SH CR/8 (SUTURE) ×1 IMPLANT
SUT SILK 3 0 TIES 10X30 (SUTURE) ×1 IMPLANT
TOWEL OR 17X24 6PK STRL BLUE (TOWEL DISPOSABLE) ×3 IMPLANT
TOWEL OR 17X26 10 PK STRL BLUE (TOWEL DISPOSABLE) ×3 IMPLANT
TRAY FOLEY MTR SLVR 16FR STAT (SET/KITS/TRAYS/PACK) IMPLANT
YANKAUER SUCT BULB TIP NO VENT (SUCTIONS) IMPLANT

## 2019-02-20 NOTE — Transfer of Care (Signed)
Immediate Anesthesia Transfer of Care Note  Patient: Jeremy [name]  Procedure(s) Performed: EXPLORATORY LAPAROTOMY (N/A Abdomen) CHANGE OUT OF WOUND VAC (N/A Abdomen)  Patient Location: ICU  Anesthesia Type:General  Level of Consciousness: Patient remains intubated per anesthesia plan  Airway & Oxygen Therapy: Patient remains intubated per anesthesia plan and Patient placed on Ventilator (see vital sign flow sheet for setting)  Post-op Assessment: Report given to RN and Post -op Vital signs reviewed and stable  Post vital signs: Reviewed and stable  Last Vitals:  Vitals Value Taken Time  BP    Temp    Pulse 92 02/20/2019 10:25 AM  Resp 16 02/20/2019 10:25 AM  SpO2 99 % 02/20/2019 10:25 AM  Vitals shown include unvalidated device data.  Last Pain:  Vitals:   02/20/19 0800  TempSrc: Axillary         Complications: No apparent anesthesia complications

## 2019-02-20 NOTE — Progress Notes (Addendum)
Patient ID: Jeremy [name], male   DOB: 11/02/1875, 23 y.o.   MRN: [id number] Follow up - Trauma Critical Care  Patient Details:    Jeremy [name] is an 23 y.o. male.  Lines/tubes : Airway 7.5 mm (Active)  Secured at (cm) 27 cm 02/20/2019  4:00 AM  Measured From Lips 02/20/2019  4:00 AM  Secured Location Right 02/20/2019  4:00 AM  Secured By [employer] 02/20/2019  4:00 AM  Tube Holder Repositioned Yes 02/20/2019  4:00 AM  Cuff Pressure (cm H2O) 70 cm H2O 02/19/2019 11:34 PM  Site Condition Dry 02/20/2019  4:00 AM     Arterial Line 02/18/19 Radial (Active)  Site Assessment Clean;Dry;Intact 02/19/2019  8:00 PM  Line Status Pulsatile blood flow 02/19/2019  8:00 PM  Art Line Waveform Appropriate;Square wave test performed;Whip 02/19/2019  8:00 PM  Art Line Interventions Zeroed and calibrated;Leveled;Connections checked and tightened;Flushed per protocol 02/19/2019  8:00 PM  Color/Movement/Sensation Capillary refill less than 3 sec 02/19/2019  8:00 PM  Dressing Type Transparent;Occlusive 02/19/2019  8:00 PM  Dressing Status Clean;Dry;Intact;Antimicrobial disc in place 02/19/2019  8:00 PM  Dressing Change Due 02/25/19 02/19/2019  8:00 PM     Negative Pressure Wound Therapy Abdomen (Active)  Last dressing change 02/18/19 02/20/2019  4:00 AM  Site / Wound Assessment Dressing in place / Unable to assess 02/20/2019  4:00 AM  Peri-wound Assessment Intact;Pink 02/20/2019  4:00 AM  Cycle Continuous;On 02/20/2019  4:00 AM  Target Pressure (mmHg) 125 02/20/2019  4:00 AM  Canister Changed Yes 02/20/2019  4:00 AM  Dressing Status Intact 02/20/2019  4:00 AM  Drainage Amount Moderate 02/20/2019  4:00 AM  Drainage Description Serosanguineous 02/20/2019  4:00 AM  Output (mL) 325 mL 02/20/2019  6:00 AM     NG/OG Tube Orogastric 18 Fr. Center mouth Aucultation (Active)  Site Assessment Clean;Dry;Intact 02/20/2019  4:00 AM  Ongoing Placement Verification No change in cm markings or external length of  tube from initial placement;No change in respiratory status;No acute changes, not attributed to clinical condition 02/20/2019  4:00 AM  Status Suction-low intermittent 02/20/2019  4:00 AM  Amount of suction 120 mmHg 02/20/2019  4:00 AM  Drainage Appearance Brown 02/20/2019  4:00 AM  Output (mL) 325 mL 02/20/2019  6:00 AM     Urethral Catheter V. DiMattia RN Latex 16 Fr. (Active)  Indication for Insertion or Continuance of Catheter Unstable critically ill patients first 24-48 hours (See Criteria) 02/20/2019  4:00 AM  Site Assessment Clean;Intact 02/20/2019  4:00 AM  Catheter Maintenance Bag below level of bladder;Catheter secured;Drainage bag/tubing not touching floor;No dependent loops;Seal intact;Insertion date on drainage bag;Bag emptied prior to transport 02/20/2019  4:00 AM  Collection Container Standard drainage bag 02/20/2019  4:00 AM  Securement Method Securing device (Describe) 02/20/2019  4:00 AM  Urinary Catheter Interventions Unclamped 02/20/2019  4:00 AM  Output (mL) 75 mL 02/20/2019  6:00 AM    Microbiology/Sepsis markers: Results for orders placed or performed during the hospital encounter of 02/18/19  MRSA PCR Screening     Status: None   Collection Time: 02/19/19  2:02 AM  Result Value Ref Range Status   MRSA by PCR NEGATIVE NEGATIVE Final    Comment:        The GeneXpert MRSA Assay (FDA approved for NASAL specimens only), is one component of a comprehensive MRSA colonization surveillance program. It is not intended to diagnose MRSA infection nor to guide or monitor treatment for MRSA infections. Performed at [facility], [address] [place], [place] [address]   SARS Coronavirus 2 [facility](Hospital order, Performed in [facility] hospital lab)     Status: None   Collection Time: 02/19/19  3:20 AM  Result Value Ref Range Status   SARS Coronavirus 2 NEGATIVE NEGATIVE Final    Comment: (NOTE) If result is NEGATIVE SARS-CoV-2 target nucleic acids are NOT DETECTED. The  SARS-CoV-2 RNA is generally detectable in upper and lower  respiratory specimens during the acute phase of infection. The lowest  concentration of SARS-CoV-2 viral copies this assay can detect is 250  copies / mL. A negative result does not preclude SARS-CoV-2 infection  and should not be used as the sole basis for treatment or other  patient management decisions.  A negative result may occur with  improper specimen collection / handling, submission of specimen other  than nasopharyngeal swab, presence of viral mutation(s) within the  areas targeted by this assay, and inadequate number of viral copies  (<250 copies / mL). A negative result must be combined with clinical  observations, patient history, and epidemiological information. If result is POSITIVE SARS-CoV-2 target nucleic acids are DETECTED. The SARS-CoV-2 RNA is generally detectable in upper and lower  respiratory specimens dur ing the acute phase of infection.  Positive  results are indicative of active infection with SARS-CoV-2.  Clinical  correlation with patient history and other diagnostic information is  necessary to determine patient infection status.  Positive results do  not rule out bacterial infection or co-infection with other viruses. If result is PRESUMPTIVE POSTIVE SARS-CoV-2 nucleic acids MAY BE PRESENT.   A presumptive positive result was obtained on the submitted specimen  and confirmed on repeat testing.  While 2019 novel coronavirus  (SARS-CoV-2) nucleic acids may be present in the submitted sample  additional confirmatory testing may be necessary for epidemiological  and / or clinical management purposes  to differentiate between  SARS-CoV-2 and other Sarbecovirus currently known to infect humans.  If clinically indicated additional testing with an alternate test  methodology [phone]) is advised. The SARS-CoV-2 RNA is generally  detectable in upper and lower respiratory sp ecimens during the acute   phase of infection. The expected result is Negative. Fact Sheet for Patients:  BoilerBrush.com.cyhttps://www.fda.gov/media/136312/download Fact Sheet for Healthcare Providers: https://pope.com/https://www.fda.gov/media/136313/download This test is not yet approved or cleared by the [place] FDA and has been authorized for detection and/or diagnosis of SARS-CoV-2 by FDA under an Emergency Use Authorization (EUA).  This EUA will remain in effect (meaning this test can be used) for the duration of the COVID-19 declaration under Section 564(b)(1) of the Act, 21 U.S.C. section 360bbb-3(b)(1), unless the authorization is terminated or revoked sooner. Performed at [facility], [address] [place], [place] [address]   Respiratory Panel by PCR     Status: None   Collection Time: 02/19/19  3:20 AM  Result Value Ref Range Status   Adenovirus NOT DETECTED NOT DETECTED Final   Coronavirus 229E NOT DETECTED NOT DETECTED Final    Comment: (NOTE) The Coronavirus on the Respiratory Panel, DOES NOT test for the novel  Coronavirus (2019 nCoV)    Coronavirus HKU1 NOT DETECTED NOT DETECTED Final   Coronavirus NL63 NOT DETECTED NOT DETECTED Final   Coronavirus OC43 NOT DETECTED NOT DETECTED Final   Metapneumovirus NOT DETECTED NOT DETECTED Final   Rhinovirus / Enterovirus NOT DETECTED NOT DETECTED Final   Influenza A NOT DETECTED NOT DETECTED Final   Influenza B NOT DETECTED  NOT DETECTED Final   Parainfluenza Virus 1 NOT DETECTED NOT DETECTED Final   Parainfluenza Virus 2 NOT DETECTED NOT DETECTED Final   Parainfluenza Virus 3 NOT DETECTED NOT DETECTED Final   Parainfluenza Virus 4 NOT DETECTED NOT DETECTED Final   Respiratory Syncytial Virus NOT DETECTED NOT DETECTED Final   Bordetella pertussis NOT DETECTED NOT DETECTED Final   Chlamydophila pneumoniae NOT DETECTED NOT DETECTED Final   Mycoplasma pneumoniae NOT DETECTED NOT DETECTED Final    Comment: Performed at [facility], [address],  [place], [place] [address]  SARS Coronavirus 2 [facility] order, Performed in [facility] hospital lab)     Status: None   Collection Time: 02/19/19  6:33 PM  Result Value Ref Range Status   SARS Coronavirus 2 NEGATIVE NEGATIVE Final    Comment: (NOTE) If result is NEGATIVE SARS-CoV-2 target nucleic acids are NOT DETECTED. The SARS-CoV-2 RNA is generally detectable in upper and lower  respiratory specimens during the acute phase of infection. The lowest  concentration of SARS-CoV-2 viral copies this assay can detect is 250  copies / mL. A negative result does not preclude SARS-CoV-2 infection  and should not be used as the sole basis for treatment or other  patient management decisions.  A negative result may occur with  improper specimen collection / handling, submission of specimen other  than nasopharyngeal swab, presence of viral mutation(s) within the  areas targeted by this assay, and inadequate number of viral copies  (<250 copies / mL). A negative result must be combined with clinical  observations, patient history, and epidemiological information. If result is POSITIVE SARS-CoV-2 target nucleic acids are DETECTED. The SARS-CoV-2 RNA is generally detectable in upper and lower  respiratory specimens dur ing the acute phase of infection.  Positive  results are indicative of active infection with SARS-CoV-2.  Clinical  correlation with patient history and other diagnostic information is  necessary to determine patient infection status.  Positive results do  not rule out bacterial infection or co-infection with other viruses. If result is PRESUMPTIVE POSTIVE SARS-CoV-2 nucleic acids MAY BE PRESENT.   A presumptive positive result was obtained on the submitted specimen  and confirmed on repeat testing.  While 2019 novel coronavirus  (SARS-CoV-2) nucleic acids may be present in the submitted sample  additional confirmatory testing may be necessary for epidemiological  and / or clinical  management purposes  to differentiate between  SARS-CoV-2 and other Sarbecovirus currently known to infect humans.  If clinically indicated additional testing with an alternate test  methodology [phone]) is advised. The SARS-CoV-2 RNA is generally  detectable in upper and lower respiratory sp ecimens during the acute  phase of infection. The expected result is Negative. Fact Sheet for Patients:  BoilerBrush.com.cy Fact Sheet for Healthcare Providers: https://pope.com/ This test is not yet approved or cleared by the [place] FDA and has been authorized for detection and/or diagnosis of SARS-CoV-2 by FDA under an Emergency Use Authorization (EUA).  This EUA will remain in effect (meaning this test can be used) for the duration of the COVID-19 declaration under Section 564(b)(1) of the Act, 21 U.S.C. section 360bbb-3(b)(1), unless the authorization is terminated or revoked sooner. Performed at [facility], [address], [place], [place] [address]     Anti-infectives:  Anti-infectives (From admission, onward)   None      Best Practice/Protocols:  VTE Prophylaxis: Mechanical Continous Sedation  Consults:     Studies:    Events:  Subjective:    Overnight Issues:   Objective:  Vital signs for last 24 hours: Temp:  [98.8 F (37.1 C)-100.1 F (37.8 C)] 100.1 F (37.8 C) (04/20 0400) Pulse Rate:  [76-89] 84 (04/20 0500) Resp:  [8-24] 16 (04/20 0600) BP: (108-152)/(57-105) 120/69 (04/20 0600) SpO2:  [90 %-100 %] 99 % (04/20 0500) Arterial Line BP: (82-154)/(55-113) 136/62 (04/20 0600) FiO2 (%):  [50 %-100 %] 50 % (04/20 0352)  Hemodynamic parameters for last 24 hours:    Intake/Output from previous day: 04/19 0701 - 04/20 0700 In: 4365.1 [I.V.:4365.1] Out: 2590 [Urine:790; Emesis/NG output:325; Drains:1475]  Intake/Output this shift: No intake/output data recorded.  Vent settings for last 24  hours: Vent Mode: PRVC FiO2 (%):  [50 %-100 %] 50 % Set Rate:  [16 bmp] 16 bmp Vt Set:  [620 mL] 620 mL PEEP:  [10 cmH20] 10 cmH20 Plateau Pressure:  [23 cmH20-26 cmH20] 26 cmH20  Physical Exam:  General: on vent Neuro: sedated HEENT/Neck: ETT and collar Resp: clear to auscultation bilaterally CVS: RRR GI: open abd VAC in place Extremities: no edema, no erythema, pulses WNL  Results for orders placed or performed during the hospital encounter of 02/18/19 (from the past 24 hour(s))  Glucose, capillary     Status: None   Collection Time: 02/19/19  8:00 AM  Result Value Ref Range   Glucose-Capillary 96 70 - 99 mg/dL   Comment 1 Notify RN    Comment 2 Document in Chart   Glucose, capillary     Status: None   Collection Time: 02/19/19 11:24 AM  Result Value Ref Range   Glucose-Capillary 83 70 - 99 mg/dL   Comment 1 Notify RN    Comment 2 Document in Chart   Glucose, capillary     Status: None   Collection Time: 02/19/19  4:24 PM  Result Value Ref Range   Glucose-Capillary 81 70 - 99 mg/dL   Comment 1 Notify RN    Comment 2 Document in Chart   SARS Coronavirus 2 [facility] order, Performed in [facility] hospital lab)     Status: None   Collection Time: 02/19/19  6:33 PM  Result Value Ref Range   SARS Coronavirus 2 NEGATIVE NEGATIVE  Glucose, capillary     Status: Abnormal   Collection Time: 02/19/19  7:28 PM  Result Value Ref Range   Glucose-Capillary 111 (H) 70 - 99 mg/dL  Glucose, capillary     Status: Abnormal   Collection Time: 02/19/19  8:33 PM  Result Value Ref Range   Glucose-Capillary 106 (H) 70 - 99 mg/dL  Glucose, capillary     Status: Abnormal   Collection Time: 02/19/19 11:30 PM  Result Value Ref Range   Glucose-Capillary 112 (H) 70 - 99 mg/dL  Glucose, capillary     Status: None   Collection Time: 02/20/19  3:09 AM  Result Value Ref Range   Glucose-Capillary 97 70 - 99 mg/dL    Assessment & Plan: Present on Admission: . Liver laceration    LOS: 2  days   Additional comments:I reviewed the patient's new clinical lab test results. and CXR [facility]  S/P exploratory laparotomy, hepatorraphy, packing of liver 4/19 by Dr. Luisa Hart - back to OR this AM for ex lap and removal of packs. Patient remains unidentified so emergency consent done.  L rib FX 2-3 Acute hypoxic ventilator dependent respiratory failure - full support with open abdomen, advance ETT 4cm now, PEEP 10 and FiO2 50% but sats good, will try  to wean down settings ABL anemia - T&C 2u for OR if needed FEN - labs pending now VTE - PAS only Dispo - OR, ICU Critical Care Total Time*: 45 Minutes  Violeta Gelinas, MD, MPH, FACS Trauma: [phone] General Surgery: [phone]  02/20/2019  *Care during the described time interval was provided by me. I have reviewed this patient's available data, including medical history, events of note, physical examination and test results as part of my evaluation.
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
## 2019-02-20 NOTE — Op Note (Signed)
02/20/2019  10:03 AM  PATIENT:  Jeremy [name]  23 y.o. male  PRE-OPERATIVE DIAGNOSIS:  Open Abdomen  POST-OPERATIVE DIAGNOSIS:  Open Abdomen, no ongoing bleeding from liver, too much edema to fully close abdomen  PROCEDURE:  Procedure(s): EXPLORATORY LAPAROTOMY REMOVAL OF PACKS (5) PARTIAL CLOSURE CHANGE OUT OF WOUND VAC  SURGEON:  Surgeon(s): Violeta Gelinas, MD  ASSISTANTS: Leary Roca, PA-C  ANESTHESIA:   general  EBL:  Total I/O In: 360 [I.V.:360] Out: 280 [Urine:130; Emesis/NG output:75; Drains:75]  BLOOD ADMINISTERED:none  DRAINS: ABTHERA   SPECIMEN:  No Specimen  DISPOSITION OF SPECIMEN:  N/A  COUNTS:  YES  DICTATION: .Dragon Dictation Findings: 5 laparotomy sponges removed from around the liver.  No ongoing hemorrhage.  No other significant injuries.  Procedure in detail: Unknown male returns to the OR for planned laparotomy.  He underwent hepatorrhaphy and packing of complex liver laceration about 33 hours ago.  His hemoglobin has stabilized and he is hemodynamically stable.  He was brought directly back from the intensive care unit to the operating room on the ventilator.  Emergency consent was obtained due to him being identified.  General anesthesia was administered.  His outer VAC drape was removed.  His abdomen was prepped and draped in a sterile fashion.  We did a timeout procedure.  I removed his inner VAC drape carefully.  I then irrigated the abdomen and carefully removed 5 laparotomy sponges from around the liver.  The liver laceration appeared hemostatic.  I inspected the gallbladder carefully and it was fully viable without injury noted.  The laceration did extend through the liver medially to the gallbladder.  Next, the abdomen was further irrigated.  Some small pieces of torn omentum were debrided.  Hemostasis was obtained with cautery.  The small bowel was run and no small bowel injuries were noted.  The right colon, transverse colon,  descending colon and rectosigmoid appeared intact.  There is no significant retroperitoneal hematoma.  Stomach was intact.  He seemed to have ongoing edema and I did not think we will be able to close him today.  I began placing figure-of-eight #1 Novafil's in the fascia at the superior and inferior portions of the wound.  I was able to place several, but his peak airway pressures started to rise.  It was getting tough to bring the fascia back together so I held off at this point and we placed an AB Thera open abdomen VAC.  This was placed in standard fashion.  The inner drape was tucked all around the bowel after trimming it.  2 blue sponges were fashioned on top of that and the area of the abdominal wall was cleaned.  Benzoin was placed on the skin and back drapes were applied.  It was hooked up to the VAC pump and there was excellent seal.  All counts were correct at this point.  He was taken directly back to the intensive care unit on the ventilator in critical condition.  There were no apparent complications.  We will plan to return the operating room in 48 hours to see if we can close him the rest of the way. PATIENT DISPOSITION:  ICU - intubated and critically ill.   Delay start of Pharmacological VTE agent (>24hrs) due to surgical blood loss or risk of bleeding:  yes  Violeta Gelinas, MD, MPH, FACS Pager: [phone]  4/20/202010:03 AM

## 2019-02-20 NOTE — Plan of Care (Signed)
  Problem: Clinical Measurements: Goal: Cardiovascular complication will be avoided Outcome: Progressing   Problem: Pain Managment: Goal: General experience of comfort will improve Outcome: Progressing   Problem: Clinical Measurements: Goal: Ability to maintain clinical measurements within normal limits will improve Outcome: Progressing   Problem: Nutrition: Goal: Adequate nutrition will be maintained Outcome: Not Progressing

## 2019-02-20 NOTE — Progress Notes (Signed)
Wasted 15 mL of fentanyl with Beverly Gust RN in trash

## 2019-02-20 NOTE — Anesthesia Postprocedure Evaluation (Signed)
Anesthesia Post Note  Patient: Jeremy [name]  Procedure(s) Performed: EXPLORATORY LAPAROTOMY (N/A Abdomen) CHANGE OUT OF WOUND VAC (N/A Abdomen)     Patient location during evaluation: ICU Anesthesia Type: General Level of consciousness: sedated Pain management: pain level controlled Vital Signs Assessment: post-procedure vital signs reviewed and stable Respiratory status: patient remains intubated per anesthesia plan Cardiovascular status: stable Postop Assessment: no apparent nausea or vomiting Anesthetic complications: no    Last Vitals:  Vitals:   02/20/19 0800 02/20/19 0900  BP: 131/74 124/70  Pulse: 80 81  Resp: 16 16  Temp: (!) 38.2 C   SpO2: 100% 99%    Last Pain:  Vitals:   02/20/19 0800  TempSrc: Axillary                 Lucretia Kern

## 2019-02-21 ENCOUNTER — Encounter (HOSPITAL_COMMUNITY): Payer: Self-pay | Admitting: General Surgery

## 2019-02-21 ENCOUNTER — Inpatient Hospital Stay (HOSPITAL_COMMUNITY): Payer: Medicaid Other

## 2019-02-21 ENCOUNTER — Inpatient Hospital Stay: Payer: Self-pay

## 2019-02-21 LAB — CBC
HCT: 31.7 % — ABNORMAL LOW (ref 39.0–52.0)
Hemoglobin: 10 g/dL — ABNORMAL LOW (ref 13.0–17.0)
MCH: 30.8 pg (ref 26.0–34.0)
MCHC: 31.5 g/dL (ref 30.0–36.0)
MCV: 97.5 fL (ref 80.0–100.0)
Platelets: 123 10*3/uL — ABNORMAL LOW (ref 150–400)
RBC: 3.25 MIL/uL — ABNORMAL LOW (ref 4.22–5.81)
RDW: 14.5 % (ref 11.5–15.5)
WBC: 7.5 10*3/uL (ref 4.0–10.5)
nRBC: 0 % (ref 0.0–0.2)

## 2019-02-21 LAB — BASIC METABOLIC PANEL
Anion gap: 5 (ref 5–15)
Anion gap: 11 (ref 5–15)
BUN: 5 mg/dL — ABNORMAL LOW (ref 6–20)
BUN: 7 mg/dL (ref 6–20)
CO2: 17 mmol/L — ABNORMAL LOW (ref 22–32)
CO2: 20 mmol/L — ABNORMAL LOW (ref 22–32)
Calcium: 5.6 mg/dL — CL (ref 8.9–10.3)
Calcium: 7.5 mg/dL — ABNORMAL LOW (ref 8.9–10.3)
Chloride: 119 mmol/L — ABNORMAL HIGH (ref 98–111)
Chloride: 107 mmol/L (ref 98–111)
Creatinine, Ser: 0.85 mg/dL (ref 0.61–1.24)
Creatinine, Ser: 0.88 mg/dL (ref 0.61–1.24)
GFR calc Af Amer: >60 mL/min (ref >60)
GFR calc Af Amer: >60 mL/min (ref >60)
GFR calc non Af Amer: >60 mL/min (ref >60)
GFR calc non Af Amer: >60 mL/min (ref >60)
Glucose, Bld: 1057 mg/dL (ref 70–99)
Glucose, Bld: 107 mg/dL — ABNORMAL HIGH (ref 70–99)
Potassium: 2.6 mmol/L — CL (ref 3.5–5.1)
Potassium: 4.1 mmol/L (ref 3.5–5.1)
Sodium: 141 mmol/L (ref 135–145)
Sodium: 138 mmol/L (ref 135–145)

## 2019-02-21 LAB — GLUCOSE, CAPILLARY
Glucose-Capillary: 100 mg/dL — ABNORMAL HIGH (ref 70–99)
Glucose-Capillary: 103 mg/dL — ABNORMAL HIGH (ref 70–99)
Glucose-Capillary: 86 mg/dL (ref 70–99)
Glucose-Capillary: 93 mg/dL (ref 70–99)
Glucose-Capillary: 98 mg/dL (ref 70–99)
Glucose-Capillary: 99 mg/dL (ref 70–99)

## 2019-02-21 MED ORDER — CHLORHEXIDINE GLUCONATE CLOTH 2 % EX PADS
6.0000 | MEDICATED_PAD | Freq: Every day | CUTANEOUS | Status: DC
  Administered 2019-02-22 – 2019-03-02 (×9): 6 via TOPICAL

## 2019-02-21 MED ORDER — FUROSEMIDE 10 MG/ML IJ SOLN
40.0000 mg | Freq: Once | INTRAMUSCULAR | Status: AC
  Administered 2019-02-21: 10:00:00 40 mg via INTRAVENOUS
  Filled 2019-02-21: qty 4

## 2019-02-21 MED ORDER — SODIUM CHLORIDE 0.9% FLUSH
10.0000 mL | INTRAVENOUS | Status: DC | PRN
  Administered 2019-02-22: 09:00:00 40 mL
  Administered 2019-03-03: 10 mL
  Filled 2019-02-21 (×2): qty 40

## 2019-02-21 MED ORDER — CALCIUM GLUCONATE-NACL 2-0.675 GM/100ML-% IV SOLN
2.0000 g | Freq: Once | INTRAVENOUS | Status: DC
  Filled 2019-02-21: qty 100

## 2019-02-21 MED ORDER — SODIUM CHLORIDE 0.9% FLUSH
10.0000 mL | Freq: Two times a day (BID) | INTRAVENOUS | Status: DC
  Administered 2019-02-21: 22:00:00 30 mL
  Administered 2019-02-21: 12:00:00 10 mL
  Administered 2019-02-22: 10:00:00 40 mL
  Administered 2019-02-22 – 2019-02-25 (×6): 10 mL
  Administered 2019-02-25: 23:00:00 40 mL
  Administered 2019-02-26 – 2019-03-05 (×12): 10 mL
  Administered 2019-03-05: 20 mL
  Administered 2019-03-06: 22:00:00 10 mL
  Administered 2019-03-06: 30 mL
  Administered 2019-03-07: 10 mL
  Administered 2019-03-08: 30 mL
  Administered 2019-03-09 – 2019-03-11 (×4): 10 mL

## 2019-02-21 MED ORDER — POTASSIUM CHLORIDE 10 MEQ/100ML IV SOLN: 10.0000 meq | INTRAVENOUS | Status: DC

## 2019-02-21 NOTE — Progress Notes (Signed)
Peripherally Inserted Central Catheter/Midline Placement  The IV Nurse has discussed with the patient and/or persons authorized to consent for the patient, the purpose of this procedure and the potential benefits and risks involved with this procedure.  The benefits include less needle sticks, lab draws from the catheter, and the patient may be discharged home with the catheter. Risks include, but not limited to, infection, bleeding, blood clot (thrombus formation), and puncture of an artery; nerve damage and irregular heartbeat and possibility to perform a PICC exchange if needed/ordered by physician.  Alternatives to this procedure were also discussed.  Bard Power PICC patient education guide, fact sheet on infection prevention and patient information card has been provided to patient /or left at bedside.    PICC/Midline Placement Documentation  PICC Triple Lumen 02/21/19 PICC Right Basilic 40 cm 0 cm (Active)  Indication for Insertion or Continuance of Line Prolonged intravenous therapies 02/21/2019 11:07 AM  Exposed Catheter (cm) 0 cm 02/21/2019 11:07 AM  Site Assessment Clean;Dry;Intact 02/21/2019 11:07 AM  Lumen #1 Status Flushed;Blood return noted 02/21/2019 11:07 AM  Lumen #2 Status Flushed;Blood return noted 02/21/2019 11:07 AM  Lumen #3 Status Flushed;Blood return noted 02/21/2019 11:07 AM  Dressing Type Transparent 02/21/2019 11:07 AM  Dressing Status Clean;Dry;Intact;Antimicrobial disc in place 02/21/2019 11:07 AM  Dressing Intervention New dressing 02/21/2019 11:07 AM  Dressing Change Due 02/28/19 02/21/2019 11:07 AM    Emergent consent signed by MD   Jeremy [name] 02/21/2019, 11:08 AM

## 2019-02-21 NOTE — Progress Notes (Signed)
Patient ID: Jeremy [name], male   DOB: [birth year], 23 y.o.   MRN: [id number] Follow up - Trauma Critical Care  Patient Details:    Jeremy [name] is an 23 y.o. male.  Lines/tubes : Airway 7.5 mm (Active)  Secured at (cm) 29 cm 02/21/2019  3:35 AM  Measured From Lips 02/21/2019  3:35 AM  Secured Location Center 02/21/2019  3:35 AM  Secured By [employer] 02/21/2019  3:35 AM  Tube Holder Repositioned Yes 02/21/2019  3:35 AM  Cuff Pressure (cm H2O) 70 cm H2O 02/19/2019 11:34 PM  Site Condition Dry 02/21/2019  3:35 AM     Negative Pressure Wound Therapy Abdomen Anterior (Active)  Last dressing change 02/20/19 02/20/2019  8:00 PM  Site / Wound Assessment Dressing in place / Unable to assess 02/20/2019  8:00 PM  Peri-wound Assessment Intact 02/20/2019  8:00 PM  Cycle Continuous 02/20/2019  8:00 PM  Canister Changed Yes 02/20/2019 12:00 PM  Dressing Status Intact 02/20/2019  8:00 PM  Drainage Amount Moderate 02/20/2019  8:00 PM  Drainage Description Serosanguineous 02/20/2019  8:00 PM  Output (mL) 150 mL 02/21/2019  6:00 AM     NG/OG Tube Orogastric 18 Fr. Center mouth Aucultation (Active)  External Length of Tube (cm) - (if applicable) 30 cm 02/20/2019  8:00 PM  Site Assessment Clean;Dry;Intact 02/20/2019  8:00 PM  Ongoing Placement Verification No change in cm markings or external length of tube from initial placement;No change in respiratory status;No acute changes, not attributed to clinical condition 02/20/2019  8:00 PM  Status Suction-low intermittent 02/20/2019  8:00 PM  Amount of suction 120 mmHg 02/20/2019  8:00 AM  Drainage Appearance Brown 02/20/2019  8:00 AM  Output (mL) 25 mL 02/21/2019  6:00 AM     Urethral Catheter V. DiMattia RN Latex 16 Fr. (Active)  Indication for Insertion or Continuance of Catheter Unstable critically ill patients first 24-48 hours (See Criteria) 02/20/2019  8:00 PM  Site Assessment Clean;Intact 02/20/2019  8:00 PM  Catheter Maintenance Bag below level of  bladder;Catheter secured;Insertion date on drainage bag;Seal intact;No dependent loops;Drainage bag/tubing not touching floor 02/21/2019  7:31 AM  Collection Container Standard drainage bag 02/20/2019  8:00 PM  Securement Method Securing device (Describe);Other (Comment) 02/20/2019  8:00 PM  Urinary Catheter Interventions Unclamped 02/20/2019  8:00 PM  Output (mL) 200 mL 02/21/2019  6:00 AM    Microbiology/Sepsis markers: Results for orders placed or performed during the hospital encounter of 02/18/19  MRSA PCR Screening     Status: None   Collection Time: 02/19/19  2:02 AM  Result Value Ref Range Status   MRSA by PCR NEGATIVE NEGATIVE Final    Comment:        The GeneXpert MRSA Assay (FDA approved for NASAL specimens only), is one component of a comprehensive MRSA colonization surveillance program. It is not intended to diagnose MRSA infection nor to guide or monitor treatment for MRSA infections. Performed at [facility], [address] [place], [place] [address]   SARS Coronavirus 2 [facility] order, Performed in [facility] hospital lab)     Status: None   Collection Time: 02/19/19  3:20 AM  Result Value Ref Range Status   SARS Coronavirus 2 NEGATIVE NEGATIVE Final    Comment: (NOTE) If result is NEGATIVE SARS-CoV-2 target nucleic acids are NOT DETECTED. The SARS-CoV-2 RNA is generally detectable in upper and lower  respiratory specimens during the acute phase of infection. The lowest  concentration of SARS-CoV-2 viral copies this  assay can detect is 250  copies / mL. A negative result does not preclude SARS-CoV-2 infection  and should not be used as the sole basis for treatment or other  patient management decisions.  A negative result may occur with  improper specimen collection / handling, submission of specimen other  than nasopharyngeal swab, presence of viral mutation(s) within the  areas targeted by this assay, and inadequate number of viral copies  (<250 copies  / mL). A negative result must be combined with clinical  observations, patient history, and epidemiological information. If result is POSITIVE SARS-CoV-2 target nucleic acids are DETECTED. The SARS-CoV-2 RNA is generally detectable in upper and lower  respiratory specimens dur ing the acute phase of infection.  Positive  results are indicative of active infection with SARS-CoV-2.  Clinical  correlation with patient history and other diagnostic information is  necessary to determine patient infection status.  Positive results do  not rule out bacterial infection or co-infection with other viruses. If result is PRESUMPTIVE POSTIVE SARS-CoV-2 nucleic acids MAY BE PRESENT.   A presumptive positive result was obtained on the submitted specimen  and confirmed on repeat testing.  While 2019 novel coronavirus  (SARS-CoV-2) nucleic acids may be present in the submitted sample  additional confirmatory testing may be necessary for epidemiological  and / or clinical management purposes  to differentiate between  SARS-CoV-2 and other Sarbecovirus currently known to infect humans.  If clinically indicated additional testing with an alternate test  methodology [phone]([id number]) is advised. The SARS-CoV-2 RNA is generally  detectable in upper and lower respiratory sp ecimens during the acute  phase of infection. The expected result is Negative. Fact Sheet for Patients:  BoilerBrush.com.cyhttps://www.fda.gov/media/136312/download Fact Sheet for Healthcare Providers: https://pope.com/https://www.fda.gov/media/136313/download This test is not yet approved or cleared by the [place] FDA and has been authorized for detection and/or diagnosis of SARS-CoV-2 by FDA under an Emergency Use Authorization (EUA).  This EUA will remain in effect (meaning this test can be used) for the duration of the COVID-19 declaration under Section 564(b)(1) of the Act, 21 U.S.C. section 360bbb-3(b)(1), unless the authorization is terminated or revoked  sooner. Performed at [facility], [address] [place], [place] [address]   Respiratory Panel by PCR     Status: None   Collection Time: 02/19/19  3:20 AM  Result Value Ref Range Status   Adenovirus NOT DETECTED NOT DETECTED Final   Coronavirus 229E NOT DETECTED NOT DETECTED Final    Comment: (NOTE) The Coronavirus on the Respiratory Panel, DOES NOT test for the novel  Coronavirus (2019 nCoV)    Coronavirus HKU1 NOT DETECTED NOT DETECTED Final   Coronavirus NL63 NOT DETECTED NOT DETECTED Final   Coronavirus OC43 NOT DETECTED NOT DETECTED Final   Metapneumovirus NOT DETECTED NOT DETECTED Final   Rhinovirus / Enterovirus NOT DETECTED NOT DETECTED Final   Influenza A NOT DETECTED NOT DETECTED Final   Influenza B NOT DETECTED NOT DETECTED Final   Parainfluenza Virus 1 NOT DETECTED NOT DETECTED Final   Parainfluenza Virus 2 NOT DETECTED NOT DETECTED Final   Parainfluenza Virus 3 NOT DETECTED NOT DETECTED Final   Parainfluenza Virus 4 NOT DETECTED NOT DETECTED Final   Respiratory Syncytial Virus NOT DETECTED NOT DETECTED Final   Bordetella pertussis NOT DETECTED NOT DETECTED Final   Chlamydophila pneumoniae NOT DETECTED NOT DETECTED Final   Mycoplasma pneumoniae NOT DETECTED NOT DETECTED Final    Comment: Performed at [facility], [address]  [place], [place] [address]  SARS Coronavirus 2 [facility] order, Performed in [facility] hospital lab)     Status: None   Collection Time: 02/19/19  6:33 PM  Result Value Ref Range Status   SARS Coronavirus 2 NEGATIVE NEGATIVE Final    Comment: (NOTE) If result is NEGATIVE SARS-CoV-2 target nucleic acids are NOT DETECTED. The SARS-CoV-2 RNA is generally detectable in upper and lower  respiratory specimens during the acute phase of infection. The lowest  concentration of SARS-CoV-2 viral copies this assay can detect is 250  copies / mL. A negative result does not preclude SARS-CoV-2 infection  and should not be used as the  sole basis for treatment or other  patient management decisions.  A negative result may occur with  improper specimen collection / handling, submission of specimen other  than nasopharyngeal swab, presence of viral mutation(s) within the  areas targeted by this assay, and inadequate number of viral copies  (<250 copies / mL). A negative result must be combined with clinical  observations, patient history, and epidemiological information. If result is POSITIVE SARS-CoV-2 target nucleic acids are DETECTED. The SARS-CoV-2 RNA is generally detectable in upper and lower  respiratory specimens dur ing the acute phase of infection.  Positive  results are indicative of active infection with SARS-CoV-2.  Clinical  correlation with patient history and other diagnostic information is  necessary to determine patient infection status.  Positive results do  not rule out bacterial infection or co-infection with other viruses. If result is PRESUMPTIVE POSTIVE SARS-CoV-2 nucleic acids MAY BE PRESENT.   A presumptive positive result was obtained on the submitted specimen  and confirmed on repeat testing.  While 2019 novel coronavirus  (SARS-CoV-2) nucleic acids may be present in the submitted sample  additional confirmatory testing may be necessary for epidemiological  and / or clinical management purposes  to differentiate between  SARS-CoV-2 and other Sarbecovirus currently known to infect humans.  If clinically indicated additional testing with an alternate test  methodology [phone]) is advised. The SARS-CoV-2 RNA is generally  detectable in upper and lower respiratory sp ecimens during the acute  phase of infection. The expected result is Negative. Fact Sheet for Patients:  BoilerBrush.com.cy Fact Sheet for Healthcare Providers: https://pope.com/ This test is not yet approved or cleared by the [place] FDA and has been authorized for detection  and/or diagnosis of SARS-CoV-2 by FDA under an Emergency Use Authorization (EUA).  This EUA will remain in effect (meaning this test can be used) for the duration of the COVID-19 declaration under Section 564(b)(1) of the Act, 21 U.S.C. section 360bbb-3(b)(1), unless the authorization is terminated or revoked sooner. Performed at [facility], [address], [place], [place] [address]     Anti-infectives:  Anti-infectives (From admission, onward)   None      Best Practice/Protocols:  VTE Prophylaxis: Mechanical Continous Sedation  Subjective:    Overnight Issues:   Objective:  Vital signs for last 24 hours: Temp:  [99 F (37.2 C)-100.7 F (38.2 C)] 99.1 F (37.3 C) (04/21 0400) Pulse Rate:  [71-90] 72 (04/21 0700) Resp:  [13-16] 16 (04/21 0700) BP: (109-152)/(54-106) 109/54 (04/21 0700) SpO2:  [97 %-100 %] 100 % (04/21 0700) Arterial Line BP: (84-115)/(73-110) 89/83 (04/20 1800) FiO2 (%):  [40 %-50 %] 40 % (04/21 0335)  Hemodynamic parameters for last 24 hours:    Intake/Output from previous day: 04/20 0701 - 04/21 0700 In: 4755.2 [I.V.:4755.2] Out: 1425 [Urine:890; Emesis/NG output:100; Drains:425; Blood:10]  Intake/Output this shift: No intake/output data recorded.  Vent settings for last 24 hours: Vent Mode: PRVC FiO2 (%):  [40 %-50 %] 40 % Set Rate:  [16 bmp] 16 bmp Vt Set:  [620 mL] 620 mL PEEP:  [10 cmH20] 10 cmH20 Plateau Pressure:  [21 cmH20-25 cmH20] 25 cmH20  Physical Exam:  General: on vent Neuro: sedated but arouses and F/C HEENT/Neck: ETT Resp: clear to auscultation bilaterally CVS: RRR GI: open abdomen VAC in place Extremities: no edema, no erythema, pulses WNL  Results for orders placed or performed during the hospital encounter of 02/18/19 (from the past 24 hour(s))  Prepare RBC     Status: None   Collection Time: 02/20/19  7:50 AM  Result Value Ref Range   Order Confirmation      ORDER PROCESSED BY BLOOD BANK Performed at  [facility], [address], [place], [place] [address]   Glucose, capillary     Status: None   Collection Time: 02/20/19  7:56 AM  Result Value Ref Range   Glucose-Capillary 91 70 - 99 mg/dL   Comment 1 Notify RN    Comment 2 Document in Chart   BLOOD TRANSFUSION REPORT - SCANNED     Status: None   Collection Time: 02/20/19 10:28 AM   Narrative   Ordered by an unspecified provider.  Glucose, capillary     Status: None   Collection Time: 02/20/19 12:01 PM  Result Value Ref Range   Glucose-Capillary 82 70 - 99 mg/dL   Comment 1 Notify RN    Comment 2 Document in Chart   Provider-confirm verbal Blood Bank order - RBC, FFP, Type & Screen; 2 Units; Order taken: 02/18/2019; 10:10 PM; Level 1 Trauma, Emergency Release, STAT, MTP 2 units of O positive rbcs and 2 units of A plasmas emergency released to the ER @ 2213. Al...     Status: None   Collection Time: 02/20/19 12:41 PM  Result Value Ref Range   Blood product order confirm      MD AUTHORIZATION REQUESTED Performed at [facility], [address] [place], [place] [address]   Glucose, capillary     Status: None   Collection Time: 02/20/19  3:53 PM  Result Value Ref Range   Glucose-Capillary 86 70 - 99 mg/dL   Comment 1 Notify RN    Comment 2 Document in Chart   Glucose, capillary     Status: None   Collection Time: 02/20/19  7:49 PM  Result Value Ref Range   Glucose-Capillary 87 70 - 99 mg/dL  Glucose, capillary     Status: Abnormal   Collection Time: 02/20/19 11:35 PM  Result Value Ref Range   Glucose-Capillary 102 (H) 70 - 99 mg/dL  Glucose, capillary     Status: Abnormal   Collection Time: 02/21/19  3:27 AM  Result Value Ref Range   Glucose-Capillary 103 (H) 70 - 99 mg/dL  Basic metabolic panel     Status: Abnormal   Collection Time: 02/21/19  5:24 AM  Result Value Ref Range   Sodium 141 135 - 145 mmol/L   Potassium 2.6 (LL) 3.5 - 5.1 mmol/L   Chloride 119 (H) 98 - 111 mmol/L   CO2 17 (L) 22 - 32 mmol/L    Glucose, Bld 1,057 (HH) 70 - 99 mg/dL   BUN 5 (L) 6 - 20 mg/dL   Creatinine, Ser 0.98 0.61 - 1.24 mg/dL   Calcium 5.6 (LL) 8.9 - 10.3 mg/dL  GFR calc non Af Amer >60 >60 mL/min   GFR calc Af Amer >60 >60 mL/min   Anion gap 5 5 - 15  Glucose, capillary     Status: None   Collection Time: 02/21/19  7:45 AM  Result Value Ref Range   Glucose-Capillary 86 70 - 99 mg/dL   Comment 1 Notify RN    Comment 2 Document in Chart     Assessment & Plan: Present on Admission: . Liver laceration    LOS: 3 days   Additional comments:I reviewed the patient's new clinical lab test results. and CXR PHBC  Grade 4 liver laceration - S/P exploratory laparotomy, hepatorraphy, packing of liver 4/19 by Dr. Luisa Hart, S/P ex lap, removal of packs and partial closure 4/20 by Dr. Janee Morn. To return to OR tomorrow for closure L rib FX 2-3 Acute hypoxic ventilator dependent respiratory failure - full support with open abdomen, gas exchange improved, decrease PEEP to 5 ABL anemia - CBC P now FEN - labs pending now, spurious result earlier this AM VTE - PAS only with liver lac for now Dispo - ICU, OR tomorrow Critical Care Total Time*: 45 Minutes  Violeta Gelinas, MD, MPH, FACS Trauma: [phone] General Surgery: [phone]  02/21/2019  *Care during the described time interval was provided by me. I have reviewed this patient's available data, including medical history, events of note, physical examination and test results as part of my evaluation.

## 2019-02-21 NOTE — Progress Notes (Signed)
Spoke with pt's mother, Ozella Almond, updated her on pt's condition. Password set up.

## 2019-02-21 NOTE — Progress Notes (Signed)
Assisted tele visit to patient with family member.  Hargun Spurling Anderson, RN   

## 2019-02-21 NOTE — Progress Notes (Signed)
Patient ID: Estuardo Sieloff, male   DOB: 04-18-1996, 23 y.o.   MRN: [id number] I spoke with his grandmother, Ladon Applebaum, and updated her on his condition. I also discussed PICC placement and the planned surgery tomorrow of ex lap and closure. Phone consent documented. She is going to notify his mother who is currently in rehab at [facility]. Jerime has been living with his grandmother.  Violeta Gelinas, MD, MPH, FACS Trauma: [phone] General Surgery: [phone]
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
## 2019-02-22 ENCOUNTER — Inpatient Hospital Stay (HOSPITAL_COMMUNITY): Payer: Medicaid Other

## 2019-02-22 ENCOUNTER — Inpatient Hospital Stay (HOSPITAL_COMMUNITY): Payer: Medicaid Other | Admitting: Certified Registered Nurse Anesthetist

## 2019-02-22 ENCOUNTER — Inpatient Hospital Stay: Payer: Self-pay

## 2019-02-22 ENCOUNTER — Encounter (HOSPITAL_COMMUNITY): Admission: EM | Discharge status: Against Medical Advice | Payer: Self-pay | Source: Home / Self Care

## 2019-02-22 HISTORY — PX: LAPAROTOMY: SHX154

## 2019-02-22 LAB — BPAM RBC
Blood Product Expiration Date: 202004282359
Blood Product Expiration Date: 202004282359
Blood Product Expiration Date: 202004282359
Blood Product Expiration Date: 202005012359
Blood Product Expiration Date: 202005012359
Blood Product Expiration Date: 202005012359
Blood Product Expiration Date: 202005012359
Blood Product Expiration Date: 202005012359
Blood Product Expiration Date: 202005012359
Blood Product Expiration Date: 202005012359
Blood Product Expiration Date: 202005012359
Blood Product Expiration Date: 202005012359
Blood Product Expiration Date: 202005012359
Blood Product Expiration Date: 202005012359
ISSUE DATE / TIME: 202004182213
ISSUE DATE / TIME: 202004182213
ISSUE DATE / TIME: 202004182236
ISSUE DATE / TIME: 202004182236
ISSUE DATE / TIME: 202004190306
ISSUE DATE / TIME: 202004190947
ISSUE DATE / TIME: 202004191331
ISSUE DATE / TIME: 202004191346
ISSUE DATE / TIME: 202004191454
ISSUE DATE / TIME: 202004200929
ISSUE DATE / TIME: 202004200929
ISSUE DATE / TIME: 202004200954
ISSUE DATE / TIME: 202004201054
ISSUE DATE / TIME: 202004201054
Unit Type and Rh: 5100
Unit Type and Rh: 5100
Unit Type and Rh: 5100
Unit Type and Rh: 5100
Unit Type and Rh: 5100
Unit Type and Rh: 5100
Unit Type and Rh: 5100
Unit Type and Rh: 5100
Unit Type and Rh: 5100
Unit Type and Rh: 5100
Unit Type and Rh: 6200
Unit Type and Rh: 6200
Unit Type and Rh: 6200
Unit Type and Rh: 6200

## 2019-02-22 LAB — TYPE AND SCREEN
ABO/RH(D): A POS
Antibody Screen: NEGATIVE
Unit division: 0
Unit division: 0
Unit division: 0
Unit division: 0
Unit division: 0
Unit division: 0
Unit division: 0
Unit division: 0
Unit division: 0
Unit division: 0
Unit division: 0
Unit division: 0
Unit division: 0
Unit division: 0

## 2019-02-22 LAB — POCT I-STAT 7, (LYTES, BLD GAS, ICA,H+H)
Acid-base deficit: 2 mmol/L (ref 0.0–2.0)
Bicarbonate: 22 mmol/L (ref 20.0–28.0)
Calcium, Ion: 1.18 mmol/L (ref 1.15–1.40)
HCT: 41 % (ref 39.0–52.0)
Hemoglobin: 13.9 g/dL (ref 13.0–17.0)
O2 Saturation: 99 %
Patient temperature: 99.1
Potassium: 3.3 mmol/L — ABNORMAL LOW (ref 3.5–5.1)
Sodium: 139 mmol/L (ref 135–145)
TCO2: 23 mmol/L (ref 22–32)
pCO2 arterial: 35.3 mmHg (ref 32.0–48.0)
pH, Arterial: 7.403 (ref 7.350–7.450)
pO2, Arterial: 117 mmHg — ABNORMAL HIGH (ref 83.0–108.0)

## 2019-02-22 LAB — BASIC METABOLIC PANEL
Anion gap: 7 (ref 5–15)
BUN: 5 mg/dL — ABNORMAL LOW (ref 6–20)
CO2: 23 mmol/L (ref 22–32)
Calcium: 7.6 mg/dL — ABNORMAL LOW (ref 8.9–10.3)
Chloride: 108 mmol/L (ref 98–111)
Creatinine, Ser: 0.87 mg/dL (ref 0.61–1.24)
GFR calc Af Amer: >60 mL/min (ref >60)
GFR calc non Af Amer: >60 mL/min (ref >60)
Glucose, Bld: 146 mg/dL — ABNORMAL HIGH (ref 70–99)
Potassium: 2.9 mmol/L — ABNORMAL LOW (ref 3.5–5.1)
Sodium: 138 mmol/L (ref 135–145)

## 2019-02-22 LAB — GLUCOSE, CAPILLARY
Glucose-Capillary: 94 mg/dL (ref 70–99)
Glucose-Capillary: 81 mg/dL (ref 70–99)
Glucose-Capillary: 68 mg/dL — ABNORMAL LOW (ref 70–99)
Glucose-Capillary: 95 mg/dL (ref 70–99)
Glucose-Capillary: 49 mg/dL — ABNORMAL LOW (ref 70–99)
Glucose-Capillary: 53 mg/dL — ABNORMAL LOW (ref 70–99)
Glucose-Capillary: 80 mg/dL (ref 70–99)
Glucose-Capillary: 101 mg/dL — ABNORMAL HIGH (ref 70–99)

## 2019-02-22 LAB — CBC
HCT: 30.5 % — ABNORMAL LOW (ref 39.0–52.0)
Hemoglobin: 9.9 g/dL — ABNORMAL LOW (ref 13.0–17.0)
MCH: 31.7 pg (ref 26.0–34.0)
MCHC: 32.5 g/dL (ref 30.0–36.0)
MCV: 97.8 fL (ref 80.0–100.0)
Platelets: 121 10*3/uL — ABNORMAL LOW (ref 150–400)
RBC: 3.12 MIL/uL — ABNORMAL LOW (ref 4.22–5.81)
RDW: 14 % (ref 11.5–15.5)
WBC: 7.7 10*3/uL (ref 4.0–10.5)
nRBC: 0 % (ref 0.0–0.2)

## 2019-02-22 LAB — SURGICAL PCR SCREEN
MRSA, PCR: NEGATIVE
Staphylococcus aureus: NEGATIVE

## 2019-02-22 LAB — TRIGLYCERIDES: Triglycerides: 173 mg/dL — ABNORMAL HIGH (ref <150)

## 2019-02-22 SURGERY — LAPAROTOMY, EXPLORATORY
Anesthesia: General | Site: Abdomen

## 2019-02-22 MED ORDER — ROCURONIUM BROMIDE 10 MG/ML (PF) SYRINGE
PREFILLED_SYRINGE | INTRAVENOUS | Status: DC | PRN
  Administered 2019-02-22 (×2): 50 mg via INTRAVENOUS

## 2019-02-22 MED ORDER — CEFAZOLIN SODIUM-DEXTROSE 2-3 GM-%(50ML) IV SOLR
INTRAVENOUS | Status: DC | PRN
  Administered 2019-02-22: 2 g via INTRAVENOUS

## 2019-02-22 MED ORDER — DEXTROSE 50 % IV SOLN
12.5000 g | Freq: Once | INTRAVENOUS | Status: AC
  Administered 2019-02-22: 16:00:00 25 mL via INTRAVENOUS

## 2019-02-22 MED ORDER — 0.9 % SODIUM CHLORIDE (POUR BTL) OPTIME
TOPICAL | Status: DC | PRN
  Administered 2019-02-22: 08:00:00 1000 mL

## 2019-02-22 MED ORDER — DEXTROSE 50 % IV SOLN
12.5000 g | Freq: Once | INTRAVENOUS | Status: AC
  Administered 2019-02-22: 13:00:00 12.5 g via INTRAVENOUS
  Filled 2019-02-22: qty 50

## 2019-02-22 MED ORDER — DEXTROSE 50 % IV SOLN
INTRAVENOUS | Status: AC
  Administered 2019-02-22: 16:00:00 25 mL via INTRAVENOUS
  Filled 2019-02-22: qty 50

## 2019-02-22 MED ORDER — PRO-STAT SUGAR FREE PO LIQD
30.0000 mL | Freq: Two times a day (BID) | ORAL | Status: DC
  Administered 2019-02-22 (×2): 30 mL
  Filled 2019-02-22 (×3): qty 30

## 2019-02-22 MED ORDER — MUPIROCIN 2 % EX OINT
1.0000 "application " | TOPICAL_OINTMENT | Freq: Two times a day (BID) | CUTANEOUS | Status: DC
  Filled 2019-02-22: qty 22

## 2019-02-22 MED ORDER — DEXAMETHASONE SODIUM PHOSPHATE 10 MG/ML IJ SOLN
INTRAMUSCULAR | Status: AC
  Filled 2019-02-22: qty 1

## 2019-02-22 MED ORDER — PROPOFOL 10 MG/ML IV BOLUS
INTRAVENOUS | Status: AC
  Filled 2019-02-22: qty 20

## 2019-02-22 MED ORDER — ROCURONIUM BROMIDE 50 MG/5ML IV SOSY
PREFILLED_SYRINGE | INTRAVENOUS | Status: AC
  Filled 2019-02-22: qty 5

## 2019-02-22 MED ORDER — POTASSIUM CHLORIDE 10 MEQ/100ML IV SOLN
10.0000 meq | INTRAVENOUS | Status: AC
  Administered 2019-02-22 (×4): 10 meq via INTRAVENOUS
  Filled 2019-02-22 (×4): qty 100

## 2019-02-22 MED ORDER — MIDAZOLAM HCL 2 MG/2ML IJ SOLN
2.0000 mg | Freq: Once | INTRAMUSCULAR | Status: AC
  Administered 2019-02-22: 14:00:00 2 mg via INTRAVENOUS

## 2019-02-22 MED ORDER — MIDAZOLAM HCL 2 MG/2ML IJ SOLN
INTRAMUSCULAR | Status: AC
  Filled 2019-02-22: qty 2

## 2019-02-22 MED ORDER — VITAL HIGH PROTEIN PO LIQD
1000.0000 mL | ORAL | Status: DC
  Administered 2019-02-22: 16:00:00 1000 mL

## 2019-02-22 MED ORDER — ONDANSETRON HCL 4 MG/2ML IJ SOLN
INTRAMUSCULAR | Status: AC
  Filled 2019-02-22: qty 2

## 2019-02-22 MED ORDER — LACTATED RINGERS IV SOLN
INTRAVENOUS | Status: DC | PRN
  Administered 2019-02-22: 08:00:00 via INTRAVENOUS

## 2019-02-22 SURGICAL SUPPLY — 42 items
BLADE CLIPPER SURG (BLADE) IMPLANT
CANISTER SUCT 3000ML PPV (MISCELLANEOUS) ×3 IMPLANT
CHLORAPREP W/TINT 26ML (MISCELLANEOUS) ×3 IMPLANT
COVER SURGICAL LIGHT HANDLE (MISCELLANEOUS) ×3 IMPLANT
COVER WAND RF STERILE (DRAPES) ×3 IMPLANT
DRAPE LAPAROSCOPIC ABDOMINAL (DRAPES) ×3 IMPLANT
DRAPE WARM FLUID 44X44 (DRAPE) ×3 IMPLANT
DRSG OPSITE POSTOP 4X10 (GAUZE/BANDAGES/DRESSINGS) IMPLANT
DRSG OPSITE POSTOP 4X12 (GAUZE/BANDAGES/DRESSINGS) ×2 IMPLANT
DRSG OPSITE POSTOP 4X6 (GAUZE/BANDAGES/DRESSINGS) ×2 IMPLANT
DRSG OPSITE POSTOP 4X8 (GAUZE/BANDAGES/DRESSINGS) IMPLANT
ELECT BLADE 6.5 EXT (BLADE) ×2 IMPLANT
ELECT CAUTERY BLADE 6.4 (BLADE) ×3 IMPLANT
ELECT REM PT RETURN 9FT ADLT (ELECTROSURGICAL) ×3
ELECTRODE REM PT RTRN 9FT ADLT (ELECTROSURGICAL) ×1 IMPLANT
GLOVE BIO SURGEON STRL SZ8 (GLOVE) ×3 IMPLANT
GLOVE BIOGEL PI IND STRL 8 (GLOVE) ×1 IMPLANT
GLOVE BIOGEL PI INDICATOR 8 (GLOVE) ×2
GOWN STRL REUS W/ TWL LRG LVL3 (GOWN DISPOSABLE) ×1 IMPLANT
GOWN STRL REUS W/ TWL XL LVL3 (GOWN DISPOSABLE) ×1 IMPLANT
GOWN STRL REUS W/TWL LRG LVL3 (GOWN DISPOSABLE) ×3
GOWN STRL REUS W/TWL XL LVL3 (GOWN DISPOSABLE) ×3
KIT BASIN OR (CUSTOM PROCEDURE TRAY) ×3 IMPLANT
KIT TURNOVER KIT B (KITS) ×3 IMPLANT
LIGASURE IMPACT 36 18CM CVD LR (INSTRUMENTS) IMPLANT
NS IRRIG 1000ML POUR BTL (IV SOLUTION) ×6 IMPLANT
PACK GENERAL/GYN (CUSTOM PROCEDURE TRAY) ×3 IMPLANT
PAD ARMBOARD 7.5X6 YLW CONV (MISCELLANEOUS) ×3 IMPLANT
PENCIL SMOKE EVACUATOR (MISCELLANEOUS) ×3 IMPLANT
SPECIMEN JAR LARGE (MISCELLANEOUS) IMPLANT
SPONGE LAP 18X18 RF (DISPOSABLE) ×2 IMPLANT
STAPLER VISISTAT 35W (STAPLE) ×3 IMPLANT
SUCTION POOLE TIP (SUCTIONS) ×3 IMPLANT
SUT NOVA 1 T20/GS 25DT (SUTURE) ×8 IMPLANT
SUT PDS AB 1 TP1 96 (SUTURE) ×6 IMPLANT
SUT SILK 2 0 SH CR/8 (SUTURE) ×3 IMPLANT
SUT SILK 2 0 TIES 10X30 (SUTURE) ×3 IMPLANT
SUT SILK 3 0 SH CR/8 (SUTURE) ×3 IMPLANT
SUT SILK 3 0 TIES 10X30 (SUTURE) ×3 IMPLANT
TOWEL OR 17X26 10 PK STRL BLUE (TOWEL DISPOSABLE) ×3 IMPLANT
TRAY FOLEY MTR SLVR 16FR STAT (SET/KITS/TRAYS/PACK) IMPLANT
YANKAUER SUCT BULB TIP NO VENT (SUCTIONS) ×2 IMPLANT

## 2019-02-22 NOTE — Transfer of Care (Signed)
Immediate Anesthesia Transfer of Care Note  Patient: Jeremy [name]  Procedure(s) Performed: EXPLORATORY LAPAROTOMY WITH CLOSURE (N/A Abdomen)  Patient Location: ICU  Anesthesia Type:General  Level of Consciousness: sedated and Patient remains intubated per anesthesia plan  Airway & Oxygen Therapy: Patient remains intubated per anesthesia plan and Patient placed on Ventilator (see vital sign flow sheet for setting)  Post-op Assessment: Report given to RN and Post -op Vital signs reviewed and stable  Post vital signs: Reviewed and stable  Last Vitals:  Vitals Value Taken Time  BP 115/67 02/22/2019 10:15 AM  Temp    Pulse 85 02/22/2019 10:25 AM  Resp 13 02/22/2019 10:25 AM  SpO2 99 % 02/22/2019 10:25 AM  Vitals shown include unvalidated device data.  Last Pain:  Vitals:   02/22/19 0850  TempSrc: Axillary         Complications: No apparent anesthesia complications

## 2019-02-22 NOTE — Op Note (Signed)
02/18/2019 - 02/22/2019  8:35 AM  PATIENT:  Jeremy [name]  23 y.o. male  PRE-OPERATIVE DIAGNOSIS: Liver laceration with open abdomen  POST-OPERATIVE DIAGNOSIS: Liver laceration with open abdomen  PROCEDURE:  Procedure(s): EXPLORATORY LAPAROTOMY WITH CLOSURE  SURGEON:  Surgeon(s): Violeta Gelinas, MD  ASSISTANTS: Leary Roca, PA-C  ANESTHESIA:   general  EBL:  No intake/output data recorded.  BLOOD ADMINISTERED:none  DRAINS: none   SPECIMEN:  No Specimen  DISPOSITION OF SPECIMEN:  N/A  COUNTS:  YES  DICTATION: .Dragon Dictation Findings: No ongoing bleeding from liver or other injury seen, significantly less edema, able to close easily  Procedure in detail: Informed consent was obtained.  He received intravenous antibiotics.  He was brought directly from the trauma neuro intensive care unit to the operating room on the ventilator.  General anesthesia was administered by the anesthesia staff.  The outer portions of his VAC drape were removed.  His abdomen was prepped and draped in a sterile fashion.  We did a timeout procedure.  The inner VAC drape was removed.  The abdomen was explored.  No additional injuries were seen.  The liver laceration had no active bleeding.  The gallbladder remained viable.  His orogastric tube was adjusted.  I have been closed his fascia with multiple interrupted #1 Novafil sutures.  His peak airway pressures remained stable throughout and it came together with no significant tension.  Hemostasis was ensured.  We irrigated the subcutaneous tissues and the skin was closed with staples.  A sterile dressing was applied.  All counts were correct.  He tolerated the procedure without apparent complication was taken directly back to the trauma neuro intensive care unit on the ventilator. PATIENT DISPOSITION:  ICU - intubated and critically ill.   Delay start of Pharmacological VTE agent (>24hrs) due to surgical blood loss or risk of bleeding:  yes  Violeta Gelinas, MD, MPH, FACS Pager: [phone]  4/22/20208:35 AM

## 2019-02-22 NOTE — Progress Notes (Signed)
Initial Nutrition Assessment RD working remotely.  DOCUMENTATION CODES:   Not applicable  INTERVENTION:   Vital High Protein started via OG tube @ 20 ml/hr 30 ml Prostat BID  Provides: 680 kcal, 72 grams protein, and 401 ml free water TF regimen and propofol at current rate providing 1947 total kcal/day (85 % of kcal needs)   NUTRITION DIAGNOSIS:   Increased nutrient needs related to (trauma) as evidenced by estimated needs.  GOAL:   Patient will meet greater than or equal to 90% of their needs  MONITOR:   I & O's, Vent status, TF tolerance  REASON FOR ASSESSMENT:   Consult, Ventilator Enteral/tube feeding initiation and management  ASSESSMENT:   Pt with no know PMH admitted as a PHBC with grade 4 liver laceration s/p ex lap, hepatorraphy, packing of liver 4/19, s/p ex lap, removal of packs and partial closure 4/20, with abd closure 4/22, L rib fxs 2-3.    Pt started TF today via OG tube Per RN may be able to extubate tomorrow.   Patient is currently intubated on ventilator support MV: 9.5 L/min Temp (24hrs), Avg:99.1 F (37.3 C), Min:98.8 F (37.1 C), Max:99.4 F (37.4 C)  Propofol: 48 ml/hr provides: 1267 kcal  Medications reviewed and include:  D5NS @ 125 ml/hr Labs reviewed: K+ 2.9 (L) VAC: 1550 ml (4/21) Vac has now been removed   NUTRITION - FOCUSED PHYSICAL EXAM:  Deferred   Diet Order:   Diet Order            Diet NPO time specified  Diet effective now              EDUCATION NEEDS:   No education needs have been identified at this time  Skin:  Skin Assessment: Reviewed RN Assessment(abd incision)  Last BM:  unknown  Height:   Ht Readings from Last 1 Encounters:  02/18/19 6' (1.829 m)    Weight:   Wt Readings from Last 1 Encounters:  02/18/19 100 kg    Ideal Body Weight:  80.9 kg  BMI:  Body mass index is 29.9 kg/m.  Estimated Nutritional Needs:   Kcal:  2281  Protein:  140-160 grams  Fluid:  > 2 L/day  Kendell Bane RD, LDN, CNSC [phone] Pager [phone] After Hours Pager

## 2019-02-22 NOTE — Progress Notes (Signed)
Peripherally Inserted Central Catheter/Midline Placement  The IV Nurse has discussed with the patient and/or persons authorized to consent for the patient, the purpose of this procedure and the potential benefits and risks involved with this procedure.  The benefits include less needle sticks, lab draws from the catheter, and the patient may be discharged home with the catheter. Risks include, but not limited to, infection, bleeding, blood clot (thrombus formation), and puncture of an artery; nerve damage and irregular heartbeat and possibility to perform a PICC exchange if needed/ordered by physician.  Alternatives to this procedure were also discussed.  Bard Power PICC patient education guide, fact sheet on infection prevention and patient information card has been provided to patient /or left at bedside.  To replace malpositioned right side PICC.  See previous consent.  PICC/Midline Placement Documentation  PICC Triple Lumen 02/21/19 PICC Right Basilic 40 cm 0 cm (Active)  Indication for Insertion or Continuance of Line Prolonged intravenous therapies;Poor Vasculature-patient has had multiple peripheral attempts or PIVs lasting less than 24 hours 02/22/2019  7:48 AM  Exposed Catheter (cm) 0 cm 02/21/2019 11:07 AM  Site Assessment Clean;Dry;Intact 02/22/2019  9:26 AM  Lumen #1 Status Flushed 02/22/2019 11:18 AM  Lumen #2 Status Infusing 02/22/2019  9:26 AM  Lumen #3 Status Flushed 02/22/2019 11:18 AM  Dressing Type Transparent;Securing device 02/22/2019  9:26 AM  Dressing Status Clean;Dry;Intact;Antimicrobial disc in place 02/22/2019  9:26 AM  Line Care Connections checked and tightened 02/22/2019  9:26 AM  Line Adjustment (NICU/IV Team Only) No 02/22/2019  9:26 AM  Dressing Intervention New dressing 02/21/2019 11:07 AM  Dressing Change Due 02/28/19 02/22/2019  9:26 AM     PICC Triple Lumen 02/22/19 PICC Left Brachial 48 cm 0 cm (Active)  Indication for Insertion or Continuance of Line Vasoactive infusions  02/22/2019  2:31 PM  Exposed Catheter (cm) 0 cm 02/22/2019  2:31 PM  Site Assessment Clean;Dry;Intact 02/22/2019  2:31 PM  Lumen #1 Status Flushed;Saline locked;Blood return noted 02/22/2019  2:31 PM  Lumen #2 Status Flushed;Saline locked;Blood return noted 02/22/2019  2:31 PM  Lumen #3 Status Flushed;Saline locked;Blood return noted 02/22/2019  2:31 PM  Dressing Type Transparent 02/22/2019  2:31 PM  Dressing Status Clean;Intact;Dry;Antimicrobial disc in place 02/22/2019  2:31 PM  Dressing Change Due 03/01/19 02/22/2019  2:31 PM       Jeremy [name] 02/22/2019, 2:35 PM

## 2019-02-22 NOTE — Progress Notes (Signed)
Patient ID: Jeremy [name], male   DOB: 04-09-1996, 23 y.o.   MRN: [id number] I called his grandmother and updated her. I answered her questions.  Violeta Gelinas, MD, MPH, FACS Trauma: [phone] General Surgery: [phone]

## 2019-02-22 NOTE — Progress Notes (Signed)
Came to room for vent check, pt is currently out of the room.

## 2019-02-22 NOTE — Plan of Care (Signed)
Tube feed initiated today, pt tolerating well

## 2019-02-22 NOTE — Progress Notes (Signed)
Patient ID: Jeremy [name], male   DOB: 19-Jan-1996, 23 y.o.   MRN: [id number] Follow up - Trauma Critical Care  Patient Details:    Jeremy [name] is an 23 y.o. male.  Lines/tubes : Airway 7.5 mm (Active)  Secured at (cm) 28 cm 02/22/2019  4:37 AM  Measured From Lips 02/22/2019  4:37 AM  Secured Location Center 02/22/2019  4:37 AM  Secured By [employer] 02/22/2019  4:37 AM  Tube Holder Repositioned Yes 02/22/2019  4:37 AM  Cuff Pressure (cm H2O) 30 cm H2O 02/21/2019 11:38 AM  Site Condition Dry 02/22/2019  4:37 AM     PICC Triple Lumen 02/21/19 PICC Right Basilic 40 cm 0 cm (Active)  Indication for Insertion or Continuance of Line Prolonged intravenous therapies 02/21/2019  8:00 PM  Exposed Catheter (cm) 0 cm 02/21/2019 11:07 AM  Site Assessment Clean;Dry;Intact 02/21/2019  8:00 PM  Lumen #1 Status Infusing;Flushed 02/21/2019  8:00 PM  Lumen #2 Status Infusing;Flushed 02/21/2019  8:00 PM  Lumen #3 Status Flushed;Saline locked 02/21/2019  8:00 PM  Dressing Type Transparent;Occlusive 02/21/2019  8:00 PM  Dressing Status Clean;Dry;Intact;Antimicrobial disc in place 02/21/2019  8:00 PM  Line Care Connections checked and tightened 02/21/2019  8:00 PM  Dressing Intervention New dressing 02/21/2019 11:07 AM  Dressing Change Due 02/28/19 02/21/2019 12:00 PM     Negative Pressure Wound Therapy Abdomen Anterior (Active)  Last dressing change 02/20/19 02/20/2019  8:00 PM  Site / Wound Assessment Dressing in place / Unable to assess 02/22/2019  4:00 AM  Peri-wound Assessment Intact 02/22/2019  4:00 AM  Cycle Continuous 02/22/2019  4:00 AM  Target Pressure (mmHg) 125 02/22/2019  4:00 AM  Canister Changed Yes 02/21/2019  5:00 PM  Dressing Status Intact 02/22/2019  4:00 AM  Drainage Amount Moderate 02/22/2019  4:00 AM  Drainage Description Serosanguineous 02/22/2019  4:00 AM  Output (mL) 450 mL 02/22/2019  3:00 AM     NG/OG Tube Orogastric 18 Fr. Center mouth Aucultation (Active)  External Length of Tube  (cm) - (if applicable) 39 cm 02/21/2019  8:00 AM  Site Assessment Clean;Dry;Intact 02/22/2019  4:00 AM  Ongoing Placement Verification No change in cm markings or external length of tube from initial placement;No change in respiratory status;No acute changes, not attributed to clinical condition 02/22/2019  4:00 AM  Status Suction-low intermittent 02/22/2019  4:00 AM  Amount of suction 120 mmHg 02/22/2019  4:00 AM  Drainage Appearance Manson Passey 02/22/2019  4:00 AM     Urethral Catheter V. DiMattia RN Latex 16 Fr. (Active)  Indication for Insertion or Continuance of Catheter Therapy based on hourly urine output monitoring and documentation for critical condition (NOT STRICT I&O) 02/22/2019  4:00 AM  Site Assessment Clean;Intact 02/22/2019  4:00 AM  Catheter Maintenance Bag below level of bladder;Catheter secured;Insertion date on drainage bag;Seal intact;No dependent loops;Drainage bag/tubing not touching floor 02/22/2019  4:00 AM  Collection Container Standard drainage bag 02/22/2019  4:00 AM  Securement Method Securing device (Describe) 02/22/2019  4:00 AM  Urinary Catheter Interventions Unclamped 02/22/2019  4:00 AM  Output (mL) 140 mL 02/22/2019  6:00 AM    Microbiology/Sepsis markers: Results for orders placed or performed during the hospital encounter of 02/18/19  MRSA PCR Screening     Status: None   Collection Time: 02/19/19  2:02 AM  Result Value Ref Range Status   MRSA by PCR NEGATIVE NEGATIVE Final    Comment:        The GeneXpert MRSA Assay (FDA approved for  NASAL specimens only), is one component of a comprehensive MRSA colonization surveillance program. It is not intended to diagnose MRSA infection nor to guide or monitor treatment for MRSA infections. Performed at [facility], [address], [place], [place] [address]   SARS Coronavirus 2 [facility] order, Performed in [facility] hospital lab)     Status: None   Collection Time: 02/19/19  3:20 AM  Result Value Ref Range  Status   SARS Coronavirus 2 NEGATIVE NEGATIVE Final    Comment: (NOTE) If result is NEGATIVE SARS-CoV-2 target nucleic acids are NOT DETECTED. The SARS-CoV-2 RNA is generally detectable in upper and lower  respiratory specimens during the acute phase of infection. The lowest  concentration of SARS-CoV-2 viral copies this assay can detect is 250  copies / mL. A negative result does not preclude SARS-CoV-2 infection  and should not be used as the sole basis for treatment or other  patient management decisions.  A negative result may occur with  improper specimen collection / handling, submission of specimen other  than nasopharyngeal swab, presence of viral mutation(s) within the  areas targeted by this assay, and inadequate number of viral copies  (<250 copies / mL). A negative result must be combined with clinical  observations, patient history, and epidemiological information. If result is POSITIVE SARS-CoV-2 target nucleic acids are DETECTED. The SARS-CoV-2 RNA is generally detectable in upper and lower  respiratory specimens dur ing the acute phase of infection.  Positive  results are indicative of active infection with SARS-CoV-2.  Clinical  correlation with patient history and other diagnostic information is  necessary to determine patient infection status.  Positive results do  not rule out bacterial infection or co-infection with other viruses. If result is PRESUMPTIVE POSTIVE SARS-CoV-2 nucleic acids MAY BE PRESENT.   A presumptive positive result was obtained on the submitted specimen  and confirmed on repeat testing.  While 2019 novel coronavirus  (SARS-CoV-2) nucleic acids may be present in the submitted sample  additional confirmatory testing may be necessary for epidemiological  and / or clinical management purposes  to differentiate between  SARS-CoV-2 and other Sarbecovirus currently known to infect humans.  If clinically indicated additional testing with an alternate  test  methodology [phone]) is advised. The SARS-CoV-2 RNA is generally  detectable in upper and lower respiratory sp ecimens during the acute  phase of infection. The expected result is Negative. Fact Sheet for Patients:  BoilerBrush.com.cy Fact Sheet for Healthcare Providers: https://pope.com/ This test is not yet approved or cleared by the [place] FDA and has been authorized for detection and/or diagnosis of SARS-CoV-2 by FDA under an Emergency Use Authorization (EUA).  This EUA will remain in effect (meaning this test can be used) for the duration of the COVID-19 declaration under Section 564(b)(1) of the Act, 21 U.S.C. section 360bbb-3(b)(1), unless the authorization is terminated or revoked sooner. Performed at [facility], [address], [place], [place] [address]   Respiratory Panel by PCR     Status: None   Collection Time: 02/19/19  3:20 AM  Result Value Ref Range Status   Adenovirus NOT DETECTED NOT DETECTED Final   Coronavirus 229E NOT DETECTED NOT DETECTED Final    Comment: (NOTE) The Coronavirus on the Respiratory Panel, DOES NOT test for the novel  Coronavirus (2019 nCoV)    Coronavirus HKU1 NOT DETECTED NOT DETECTED Final   Coronavirus NL63 NOT DETECTED NOT DETECTED Final   Coronavirus OC43 NOT DETECTED NOT DETECTED  Final   Metapneumovirus NOT DETECTED NOT DETECTED Final   Rhinovirus / Enterovirus NOT DETECTED NOT DETECTED Final   Influenza A NOT DETECTED NOT DETECTED Final   Influenza B NOT DETECTED NOT DETECTED Final   Parainfluenza Virus 1 NOT DETECTED NOT DETECTED Final   Parainfluenza Virus 2 NOT DETECTED NOT DETECTED Final   Parainfluenza Virus 3 NOT DETECTED NOT DETECTED Final   Parainfluenza Virus 4 NOT DETECTED NOT DETECTED Final   Respiratory Syncytial Virus NOT DETECTED NOT DETECTED Final   Bordetella pertussis NOT DETECTED NOT DETECTED Final   Chlamydophila pneumoniae NOT DETECTED NOT  DETECTED Final   Mycoplasma pneumoniae NOT DETECTED NOT DETECTED Final    Comment: Performed at [facility], [address] [place], [place] [address]  SARS Coronavirus 2 [facility](Hospital order, Performed in [facility] hospital lab)     Status: None   Collection Time: 02/19/19  6:33 PM  Result Value Ref Range Status   SARS Coronavirus 2 NEGATIVE NEGATIVE Final    Comment: (NOTE) If result is NEGATIVE SARS-CoV-2 target nucleic acids are NOT DETECTED. The SARS-CoV-2 RNA is generally detectable in upper and lower  respiratory specimens during the acute phase of infection. The lowest  concentration of SARS-CoV-2 viral copies this assay can detect is 250  copies / mL. A negative result does not preclude SARS-CoV-2 infection  and should not be used as the sole basis for treatment or other  patient management decisions.  A negative result may occur with  improper specimen collection / handling, submission of specimen other  than nasopharyngeal swab, presence of viral mutation(s) within the  areas targeted by this assay, and inadequate number of viral copies  (<250 copies / mL). A negative result must be combined with clinical  observations, patient history, and epidemiological information. If result is POSITIVE SARS-CoV-2 target nucleic acids are DETECTED. The SARS-CoV-2 RNA is generally detectable in upper and lower  respiratory specimens dur ing the acute phase of infection.  Positive  results are indicative of active infection with SARS-CoV-2.  Clinical  correlation with patient history and other diagnostic information is  necessary to determine patient infection status.  Positive results do  not rule out bacterial infection or co-infection with other viruses. If result is PRESUMPTIVE POSTIVE SARS-CoV-2 nucleic acids MAY BE PRESENT.   A presumptive positive result was obtained on the submitted specimen  and confirmed on repeat testing.  While 2019 novel coronavirus  (SARS-CoV-2)  nucleic acids may be present in the submitted sample  additional confirmatory testing may be necessary for epidemiological  and / or clinical management purposes  to differentiate between  SARS-CoV-2 and other Sarbecovirus currently known to infect humans.  If clinically indicated additional testing with an alternate test  methodology [phone] is advised. The SARS-CoV-2 RNA is generally  detectable in upper and lower respiratory sp ecimens during the acute  phase of infection. The expected result is Negative. Fact Sheet for Patients:  BoilerBrush.com.cyhttps://www.fda.gov/media/136312/download Fact Sheet for Healthcare Providers: https://pope.com/https://www.fda.gov/media/136313/download This test is not yet approved or cleared by the [place] FDA and has been authorized for detection and/or diagnosis of SARS-CoV-2 by FDA under an Emergency Use Authorization (EUA).  This EUA will remain in effect (meaning this test can be used) for the duration of the COVID-19 declaration under Section 564(b)(1) of the Act, 21 U.S.C. section 360bbb-3(b)(1), unless the authorization is terminated or revoked sooner. Performed at [facility], [address] [place], [place] [address]  Anti-infectives:  Anti-infectives (From admission, onward)   None      Best Practice/Protocols:  VTE Prophylaxis: Mechanical Continous Sedation  Consults:     Studies:    Events:  Subjective:    Overnight Issues:   Objective:  Vital signs for last 24 hours: Temp:  [99.1 F (37.3 C)-100.5 F (38.1 C)] 99.1 F (37.3 C) (04/22 0400) Pulse Rate:  [69-112] 72 (04/22 0700) Resp:  [14-30] 16 (04/22 0700) BP: (95-112)/(47-72) 95/56 (04/22 0700) SpO2:  [93 %-100 %] 100 % (04/22 0700) FiO2 (%):  [30 %-40 %] 30 % (04/22 0437)  Hemodynamic parameters for last 24 hours:    Intake/Output from previous day: 04/21 0701 - 04/22 0700 In: 4795.8 [I.V.:4495.8; IV Piggyback:300] Out: 4695 [Urine:3145;  Drains:1550]  Intake/Output this shift: No intake/output data recorded.  Vent settings for last 24 hours: Vent Mode: PRVC FiO2 (%):  [30 %-40 %] 30 % Set Rate:  [16 bmp] 16 bmp Vt Set:  [620 mL] 620 mL PEEP:  [5 cmH20] 5 cmH20 Plateau Pressure:  [18 cmH20-19 cmH20] 19 cmH20  Physical Exam:  General: on vent Neuro: sedated HEENT/Neck: ETT and collar Resp: clear to auscultation bilaterally CVS: RRR GI: open abd VAC Extremities: no edema, no erythema, pulses WNL  Results for orders placed or performed during the hospital encounter of 02/18/19 (from the past 24 hour(s))  CBC     Status: Abnormal   Collection Time: 02/21/19  7:42 AM  Result Value Ref Range   WBC 7.5 4.0 - 10.5 K/uL   RBC 3.25 (L) 4.22 - 5.81 MIL/uL   Hemoglobin 10.0 (L) 13.0 - 17.0 g/dL   HCT 29.5 (L) 62.1 - 30.8 %   MCV 97.5 80.0 - 100.0 fL   MCH 30.8 26.0 - 34.0 pg   MCHC 31.5 30.0 - 36.0 g/dL   RDW 65.7 84.6 - 96.2 %   Platelets 123 (L) 150 - 400 K/uL   nRBC 0.0 0.0 - 0.2 %  Basic metabolic panel     Status: Abnormal   Collection Time: 02/21/19  7:42 AM  Result Value Ref Range   Sodium 138 135 - 145 mmol/L   Potassium 4.1 3.5 - 5.1 mmol/L   Chloride 107 98 - 111 mmol/L   CO2 20 (L) 22 - 32 mmol/L   Glucose, Bld 107 (H) 70 - 99 mg/dL   BUN 7 6 - 20 mg/dL   Creatinine, Ser 9.52 0.61 - 1.24 mg/dL   Calcium 7.5 (L) 8.9 - 10.3 mg/dL   GFR calc non Af Amer >60 >60 mL/min   GFR calc Af Amer >60 >60 mL/min   Anion gap 11 5 - 15  Glucose, capillary     Status: None   Collection Time: 02/21/19  7:45 AM  Result Value Ref Range   Glucose-Capillary 86 70 - 99 mg/dL   Comment 1 Notify RN    Comment 2 Document in Chart   Glucose, capillary     Status: None   Collection Time: 02/21/19 11:39 AM  Result Value Ref Range   Glucose-Capillary 93 70 - 99 mg/dL   Comment 1 Notify RN    Comment 2 Document in Chart   Glucose, capillary     Status: Abnormal   Collection Time: 02/21/19  3:32 PM  Result Value Ref  Range   Glucose-Capillary 100 (H) 70 - 99 mg/dL   Comment 1 Notify RN    Comment 2 Document in Chart   Glucose, capillary  Status: None   Collection Time: 02/21/19  7:54 PM  Result Value Ref Range   Glucose-Capillary 98 70 - 99 mg/dL  Glucose, capillary     Status: None   Collection Time: 02/21/19 11:41 PM  Result Value Ref Range   Glucose-Capillary 99 70 - 99 mg/dL  Triglycerides     Status: Abnormal   Collection Time: 02/22/19  1:45 AM  Result Value Ref Range   Triglycerides 173 (H) <150 mg/dL  CBC     Status: Abnormal   Collection Time: 02/22/19  1:45 AM  Result Value Ref Range   WBC 7.7 4.0 - 10.5 K/uL   RBC 3.12 (L) 4.22 - 5.81 MIL/uL   Hemoglobin 9.9 (L) 13.0 - 17.0 g/dL   HCT 16.1 (L) 09.6 - 04.5 %   MCV 97.8 80.0 - 100.0 fL   MCH 31.7 26.0 - 34.0 pg   MCHC 32.5 30.0 - 36.0 g/dL   RDW 40.9 81.1 - 91.4 %   Platelets 121 (L) 150 - 400 K/uL   nRBC 0.0 0.0 - 0.2 %  Basic metabolic panel     Status: Abnormal   Collection Time: 02/22/19  1:45 AM  Result Value Ref Range   Sodium 138 135 - 145 mmol/L   Potassium 2.9 (L) 3.5 - 5.1 mmol/L   Chloride 108 98 - 111 mmol/L   CO2 23 22 - 32 mmol/L   Glucose, Bld 146 (H) 70 - 99 mg/dL   BUN 5 (L) 6 - 20 mg/dL   Creatinine, Ser 7.82 0.61 - 1.24 mg/dL   Calcium 7.6 (L) 8.9 - 10.3 mg/dL   GFR calc non Af Amer >60 >60 mL/min   GFR calc Af Amer >60 >60 mL/min   Anion gap 7 5 - 15  Glucose, capillary     Status: None   Collection Time: 02/22/19  3:38 AM  Result Value Ref Range   Glucose-Capillary 94 70 - 99 mg/dL  I-STAT 7, (LYTES, BLD GAS, ICA, H+H)     Status: Abnormal   Collection Time: 02/22/19  4:46 AM  Result Value Ref Range   pH, Arterial 7.403 7.350 - 7.450   pCO2 arterial 35.3 32.0 - 48.0 mmHg   pO2, Arterial 117.0 (H) 83.0 - 108.0 mmHg   Bicarbonate 22.0 20.0 - 28.0 mmol/L   TCO2 23 22 - 32 mmol/L   O2 Saturation 99.0 %   Acid-base deficit 2.0 0.0 - 2.0 mmol/L   Sodium 139 135 - 145 mmol/L   Potassium 3.3 (L)  3.5 - 5.1 mmol/L   Calcium, Ion 1.18 1.15 - 1.40 mmol/L   HCT 41.0 39.0 - 52.0 %   Hemoglobin 13.9 13.0 - 17.0 g/dL   Patient temperature 95.6 F    Collection site RADIAL, ALLEN'S TEST ACCEPTABLE    Drawn by Operator    Sample type ARTERIAL     Assessment & Plan: Present on Admission: . Liver laceration    LOS: 4 days   Additional comments:I reviewed the patient's new clinical lab test results. and CXR PHBC  Grade 4 liver laceration - S/P exploratory laparotomy, hepatorraphy, packing of liver 4/19 by Dr. Luisa Hart, S/P ex lap, removal of packs and partial closure 4/20 by Dr. Janee Morn. To return to OR today for closure, consent obtained from his grandmother L rib FX 2-3 Acute hypoxic ventilator dependent respiratory failure - full support with open abdomen, gas exchange good, hope to wean tomorrow C spine - plan to examine once extubated ABL anemia -  Hb 9.9 FEN - diuresed well yesterday, replete hypokalemia VTE - PAS only with liver lac for now Dispo - ICU, OR this AM Critical Care Total Time*: 45 Minutes  Violeta Gelinas, MD, MPH, FACS Trauma: [phone] General Surgery: [phone]  02/22/2019  *Care during the described time interval was provided by me. I have reviewed this patient's available data, including medical history, events of note, physical examination and test results as part of my evaluation.

## 2019-02-23 ENCOUNTER — Encounter (HOSPITAL_COMMUNITY): Payer: Self-pay | Admitting: General Surgery

## 2019-02-23 LAB — CBC
HCT: 27.3 % — ABNORMAL LOW (ref 39.0–52.0)
Hemoglobin: 9 g/dL — ABNORMAL LOW (ref 13.0–17.0)
MCH: 33.1 pg (ref 26.0–34.0)
MCHC: 33 g/dL (ref 30.0–36.0)
MCV: 100.4 fL — ABNORMAL HIGH (ref 80.0–100.0)
Platelets: 120 10*3/uL — ABNORMAL LOW (ref 150–400)
RBC: 2.72 MIL/uL — ABNORMAL LOW (ref 4.22–5.81)
RDW: 13.5 % (ref 11.5–15.5)
WBC: 6.8 10*3/uL (ref 4.0–10.5)
nRBC: 0 % (ref 0.0–0.2)

## 2019-02-23 LAB — BASIC METABOLIC PANEL
Anion gap: 8 (ref 5–15)
BUN: 6 mg/dL (ref 6–20)
CO2: 21 mmol/L — ABNORMAL LOW (ref 22–32)
Calcium: 7.1 mg/dL — ABNORMAL LOW (ref 8.9–10.3)
Chloride: 109 mmol/L (ref 98–111)
Creatinine, Ser: 0.79 mg/dL (ref 0.61–1.24)
GFR calc Af Amer: >60 mL/min (ref >60)
GFR calc non Af Amer: >60 mL/min (ref >60)
Glucose, Bld: 266 mg/dL — ABNORMAL HIGH (ref 70–99)
Potassium: 2.9 mmol/L — ABNORMAL LOW (ref 3.5–5.1)
Sodium: 138 mmol/L (ref 135–145)

## 2019-02-23 LAB — GLUCOSE, CAPILLARY
Glucose-Capillary: 85 mg/dL (ref 70–99)
Glucose-Capillary: 86 mg/dL (ref 70–99)
Glucose-Capillary: 95 mg/dL (ref 70–99)
Glucose-Capillary: 95 mg/dL (ref 70–99)
Glucose-Capillary: 98 mg/dL (ref 70–99)
Glucose-Capillary: 98 mg/dL (ref 70–99)
Glucose-Capillary: 99 mg/dL (ref 70–99)

## 2019-02-23 MED ORDER — DEXMEDETOMIDINE HCL IN NACL 200 MCG/50ML IV SOLN
0.4000 ug/kg/h | INTRAVENOUS | Status: DC
  Administered 2019-02-23: 10:00:00 1 ug/kg/h via INTRAVENOUS
  Filled 2019-02-23: qty 50

## 2019-02-23 MED ORDER — CALCIUM GLUCONATE-NACL 1-0.675 GM/50ML-% IV SOLN
1.0000 g | Freq: Once | INTRAVENOUS | Status: AC
  Administered 2019-02-23: 1000 mg via INTRAVENOUS
  Filled 2019-02-23: qty 50

## 2019-02-23 MED ORDER — HALOPERIDOL LACTATE 5 MG/ML IJ SOLN
5.0000 mg | Freq: Four times a day (QID) | INTRAMUSCULAR | Status: DC | PRN
  Administered 2019-02-23 – 2019-03-09 (×16): 5 mg via INTRAVENOUS
  Filled 2019-02-23 (×18): qty 1

## 2019-02-23 MED ORDER — DEXMEDETOMIDINE HCL IN NACL 400 MCG/100ML IV SOLN
0.4000 ug/kg/h | INTRAVENOUS | Status: DC
  Administered 2019-02-23 – 2019-02-25 (×21): 2 ug/kg/h via INTRAVENOUS
  Filled 2019-02-23 (×10): qty 100
  Filled 2019-02-23 (×2): qty 200
  Filled 2019-02-23 (×4): qty 100
  Filled 2019-02-23: qty 200
  Filled 2019-02-23: qty 100
  Filled 2019-02-23: qty 200
  Filled 2019-02-23 (×2): qty 100

## 2019-02-23 MED ORDER — POTASSIUM CHLORIDE 10 MEQ/50ML IV SOLN
10.0000 meq | INTRAVENOUS | Status: AC
  Administered 2019-02-23 (×4): 10 meq via INTRAVENOUS
  Filled 2019-02-23 (×4): qty 50

## 2019-02-23 MED ORDER — LORAZEPAM 2 MG/ML IJ SOLN
2.0000 mg | INTRAMUSCULAR | Status: DC | PRN
  Administered 2019-02-23 – 2019-03-09 (×32): 2 mg via INTRAVENOUS
  Filled 2019-02-23 (×36): qty 1

## 2019-02-23 NOTE — Progress Notes (Signed)
Attempting to wean fio2.  Pt was on NRB still, placed on 6 lpm [place], sat 94%.  NO distress noted, RN aware.

## 2019-02-23 NOTE — Progress Notes (Signed)
Spoke to E-Link MD about patient having course lung sounds and rhonchi. New order received for nasal suctioning. Will continue to monitor.

## 2019-02-23 NOTE — Progress Notes (Signed)
Assisted tele visit to patient with family member.  Preslea Rhodus P, RN  

## 2019-02-23 NOTE — Progress Notes (Signed)
Assisted tele visit to patient with family member.  Aiza Vollrath P, RN  

## 2019-02-23 NOTE — Procedures (Signed)
Extubation Procedure Note  Patient Details:   Name: Jeremy [name] DOB: 1996/04/19 MRN: [id number]   Airway Documentation: + leak test prior to extubation   Vent end date: 02/23/19 Vent end time: 0940   Evaluation  O2 sats: stable throughout Complications: No apparent complications Patient did tolerate procedure well. Bilateral Breath Sounds: Diminished, Other (Comment), Rhonchi(coarse)   Yes  Jennette Kettle 02/23/2019, 9:52 AM

## 2019-02-23 NOTE — Progress Notes (Signed)
A couple of minutes s/p extubation, pt became agitated and desat to 77% on [place].  Pt placed on NRB, sat slowly improved to 100%.  Pt w/ congested cough, deep suctioned oropharynx.  RN and MD at bedside w/ pt.  Pt is now talking, sat 100%.

## 2019-02-23 NOTE — Progress Notes (Signed)
eLink Physician-Brief Progress Note Patient Name: Altus Smedberg DOB: Jan 16, 1996 MRN: [id number]   Date of Service  02/23/2019  HPI/Events of Note  Extubated today and now with secretions and coarse breath sounds  eICU Interventions  Naso-tracheal suctioning order entered        Migdalia Dk 02/23/2019, 9:14 PM
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
## 2019-02-24 LAB — BASIC METABOLIC PANEL
Anion gap: 10 (ref 5–15)
BUN: 5 mg/dL — ABNORMAL LOW (ref 6–20)
CO2: 23 mmol/L (ref 22–32)
Calcium: 8 mg/dL — ABNORMAL LOW (ref 8.9–10.3)
Chloride: 107 mmol/L (ref 98–111)
Creatinine, Ser: 0.65 mg/dL (ref 0.61–1.24)
GFR calc Af Amer: >60 mL/min (ref >60)
GFR calc non Af Amer: >60 mL/min (ref >60)
Glucose, Bld: 95 mg/dL (ref 70–99)
Potassium: 3.3 mmol/L — ABNORMAL LOW (ref 3.5–5.1)
Sodium: 140 mmol/L (ref 135–145)

## 2019-02-24 LAB — CBC
HCT: 30.4 % — ABNORMAL LOW (ref 39.0–52.0)
Hemoglobin: 10.3 g/dL — ABNORMAL LOW (ref 13.0–17.0)
MCH: 32.1 pg (ref 26.0–34.0)
MCHC: 33.9 g/dL (ref 30.0–36.0)
MCV: 94.7 fL (ref 80.0–100.0)
Platelets: 170 10*3/uL (ref 150–400)
RBC: 3.21 MIL/uL — ABNORMAL LOW (ref 4.22–5.81)
RDW: 12.7 % (ref 11.5–15.5)
WBC: 10.8 10*3/uL — ABNORMAL HIGH (ref 4.0–10.5)
nRBC: 0 % (ref 0.0–0.2)

## 2019-02-24 LAB — GLUCOSE, CAPILLARY
Glucose-Capillary: 76 mg/dL (ref 70–99)
Glucose-Capillary: 86 mg/dL (ref 70–99)
Glucose-Capillary: 89 mg/dL (ref 70–99)
Glucose-Capillary: 92 mg/dL (ref 70–99)
Glucose-Capillary: 94 mg/dL (ref 70–99)
Glucose-Capillary: 99 mg/dL (ref 70–99)

## 2019-02-24 LAB — MAGNESIUM: Magnesium: 1.5 mg/dL — ABNORMAL LOW (ref 1.7–2.4)

## 2019-02-24 LAB — PHOSPHORUS
Phosphorus: 2.5 mg/dL (ref 2.5–4.6)
Phosphorus: 2.7 mg/dL (ref 2.5–4.6)

## 2019-02-24 MED ORDER — CLONAZEPAM 1 MG PO TABS
1.0000 mg | ORAL_TABLET | Freq: Two times a day (BID) | ORAL | Status: DC
  Administered 2019-02-24 – 2019-02-26 (×6): 1 mg
  Filled 2019-02-24 (×6): qty 1

## 2019-02-24 MED ORDER — CHLORHEXIDINE GLUCONATE 0.12 % MT SOLN
OROMUCOSAL | Status: AC
  Filled 2019-02-24: qty 15

## 2019-02-24 MED ORDER — ACETAMINOPHEN 160 MG/5ML PO SOLN
650.0000 mg | Freq: Four times a day (QID) | ORAL | Status: DC | PRN
  Administered 2019-02-25 – 2019-03-07 (×10): 650 mg
  Filled 2019-02-24 (×10): qty 20.3

## 2019-02-24 MED ORDER — PIVOT 1.5 CAL PO LIQD
1000.0000 mL | ORAL | Status: DC
  Administered 2019-02-24: 13:00:00 1000 mL

## 2019-02-24 MED ORDER — METOPROLOL TARTRATE 25 MG/10 ML ORAL SUSPENSION
25.0000 mg | Freq: Two times a day (BID) | ORAL | Status: DC
  Administered 2019-02-24 – 2019-03-10 (×29): 25 mg
  Filled 2019-02-24 (×29): qty 10

## 2019-02-24 MED ORDER — OXYCODONE HCL 5 MG/5ML PO SOLN
10.0000 mg | ORAL | Status: DC | PRN
  Administered 2019-02-25 – 2019-03-02 (×7): 10 mg
  Filled 2019-02-24 (×7): qty 10

## 2019-02-24 MED ORDER — VITAL HIGH PROTEIN PO LIQD: 1000.0000 mL | ORAL | Status: DC

## 2019-02-24 MED ORDER — QUETIAPINE FUMARATE 100 MG PO TABS
100.0000 mg | ORAL_TABLET | Freq: Two times a day (BID) | ORAL | Status: DC
  Administered 2019-02-24 – 2019-02-26 (×6): 100 mg
  Filled 2019-02-24 (×6): qty 1

## 2019-02-24 MED ORDER — POTASSIUM CHLORIDE 20 MEQ/15ML (10%) PO SOLN
40.0000 meq | Freq: Once | ORAL | Status: AC
  Administered 2019-02-24: 40 meq
  Filled 2019-02-24: qty 30

## 2019-02-24 MED ORDER — PRO-STAT SUGAR FREE PO LIQD: 30.0000 mL | Freq: Two times a day (BID) | ORAL | Status: DC
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
## 2019-02-24 NOTE — Anesthesia Postprocedure Evaluation (Signed)
Anesthesia Post Note  Patient: Jeremy [name]  Procedure(s) Performed: EXPLORATORY LAPAROTOMY WITH CLOSURE (N/A Abdomen)     Patient location during evaluation: PACU Anesthesia Type: General Level of consciousness: awake and alert Pain management: pain level controlled Vital Signs Assessment: post-procedure vital signs reviewed and stable Respiratory status: spontaneous breathing, nonlabored ventilation, respiratory function stable and patient connected to nasal cannula oxygen Cardiovascular status: blood pressure returned to baseline and stable Postop Assessment: no apparent nausea or vomiting Anesthetic complications: no    Last Vitals:  Vitals:   02/24/19 1600 02/24/19 1700  BP: (!) 183/100 (!) 174/111  Pulse: 81 87  Resp: (!) 34 (!) 43  Temp:    SpO2: 100% 99%    Last Pain:  Vitals:   02/24/19 1200  TempSrc: Axillary  PainSc: Asleep                 Skyelyn Scruggs S

## 2019-02-24 NOTE — Progress Notes (Signed)
Patient ID: Jeremy [name], male   DOB: 1996-10-22, 23 y.o.   MRN: [id number] Follow up - Trauma Critical Care  Patient Details:    Jeremy [name] is an 23 y.o. male.  Lines/tubes : PICC Triple Lumen 02/22/19 PICC Left Brachial 48 cm 0 cm (Active)  Indication for Insertion or Continuance of Line Vasoactive infusions 02/23/2019  8:00 PM  Exposed Catheter (cm) 0 cm 02/22/2019  2:31 PM  Site Assessment Clean;Dry;Intact 02/23/2019  8:00 PM  Lumen #1 Status Infusing 02/23/2019  8:00 PM  Lumen #2 Status Infusing 02/23/2019  8:00 PM  Lumen #3 Status In-line blood sampling system in place;Flushed;Saline locked 02/23/2019  8:00 PM  Dressing Type Transparent;Occlusive 02/23/2019  8:00 PM  Dressing Status Clean;Dry;Intact;Antimicrobial disc in place 02/23/2019  8:00 PM  Line Care Lumen 1 tubing changed;Lumen 2 tubing changed;Lumen 3 tubing changed;Connections checked and tightened;Other (Comment) 02/22/2019  4:00 PM  Dressing Change Due 03/01/19 02/23/2019  8:00 PM     External Urinary Catheter (Active)  Collection Container Standard drainage bag 02/23/2019  8:00 PM  Intervention Equipment Changed 02/24/2019  2:00 AM  Output (mL) 350 mL 02/24/2019  6:00 AM    Microbiology/Sepsis markers: Results for orders placed or performed during the hospital encounter of 02/18/19  MRSA PCR Screening     Status: None   Collection Time: 02/19/19  2:02 AM  Result Value Ref Range Status   MRSA by PCR NEGATIVE NEGATIVE Final    Comment:        The GeneXpert MRSA Assay (FDA approved for NASAL specimens only), is one component of a comprehensive MRSA colonization surveillance program. It is not intended to diagnose MRSA infection nor to guide or monitor treatment for MRSA infections. Performed at [facility], [address] [place], [place] [address]   SARS Coronavirus 2 [facility] order, Performed in [facility] hospital lab)     Status: None   Collection Time: 02/19/19  3:20 AM  Result Value Ref Range Status    SARS Coronavirus 2 NEGATIVE NEGATIVE Final    Comment: (NOTE) If result is NEGATIVE SARS-CoV-2 target nucleic acids are NOT DETECTED. The SARS-CoV-2 RNA is generally detectable in upper and lower  respiratory specimens during the acute phase of infection. The lowest  concentration of SARS-CoV-2 viral copies this assay can detect is 250  copies / mL. A negative result does not preclude SARS-CoV-2 infection  and should not be used as the sole basis for treatment or other  patient management decisions.  A negative result may occur with  improper specimen collection / handling, submission of specimen other  than nasopharyngeal swab, presence of viral mutation(s) within the  areas targeted by this assay, and inadequate number of viral copies  (<250 copies / mL). A negative result must be combined with clinical  observations, patient history, and epidemiological information. If result is POSITIVE SARS-CoV-2 target nucleic acids are DETECTED. The SARS-CoV-2 RNA is generally detectable in upper and lower  respiratory specimens dur ing the acute phase of infection.  Positive  results are indicative of active infection with SARS-CoV-2.  Clinical  correlation with patient history and other diagnostic information is  necessary to determine patient infection status.  Positive results do  not rule out bacterial infection or co-infection with other viruses. If result is PRESUMPTIVE POSTIVE SARS-CoV-2 nucleic acids MAY BE PRESENT.   A presumptive positive result was obtained on the submitted specimen  and confirmed on repeat testing.  While 2019 novel coronavirus  (SARS-CoV-2) nucleic  acids may be present in the submitted sample  additional confirmatory testing may be necessary for epidemiological  and / or clinical management purposes  to differentiate between  SARS-CoV-2 and other Sarbecovirus currently known to infect humans.  If clinically indicated additional testing with an alternate test   methodology [phone]([id number]) is advised. The SARS-CoV-2 RNA is generally  detectable in upper and lower respiratory sp ecimens during the acute  phase of infection. The expected result is Negative. Fact Sheet for Patients:  BoilerBrush.com.cyhttps://www.fda.gov/media/136312/download Fact Sheet for Healthcare Providers: https://pope.com/https://www.fda.gov/media/136313/download This test is not yet approved or cleared by the [place] FDA and has been authorized for detection and/or diagnosis of SARS-CoV-2 by FDA under an Emergency Use Authorization (EUA).  This EUA will remain in effect (meaning this test can be used) for the duration of the COVID-19 declaration under Section 564(b)(1) of the Act, 21 U.S.C. section 360bbb-3(b)(1), unless the authorization is terminated or revoked sooner. Performed at [facility], [address] [place], [place] [address]   Respiratory Panel by PCR     Status: None   Collection Time: 02/19/19  3:20 AM  Result Value Ref Range Status   Adenovirus NOT DETECTED NOT DETECTED Final   Coronavirus 229E NOT DETECTED NOT DETECTED Final    Comment: (NOTE) The Coronavirus on the Respiratory Panel, DOES NOT test for the novel  Coronavirus (2019 nCoV)    Coronavirus HKU1 NOT DETECTED NOT DETECTED Final   Coronavirus NL63 NOT DETECTED NOT DETECTED Final   Coronavirus OC43 NOT DETECTED NOT DETECTED Final   Metapneumovirus NOT DETECTED NOT DETECTED Final   Rhinovirus / Enterovirus NOT DETECTED NOT DETECTED Final   Influenza A NOT DETECTED NOT DETECTED Final   Influenza B NOT DETECTED NOT DETECTED Final   Parainfluenza Virus 1 NOT DETECTED NOT DETECTED Final   Parainfluenza Virus 2 NOT DETECTED NOT DETECTED Final   Parainfluenza Virus 3 NOT DETECTED NOT DETECTED Final   Parainfluenza Virus 4 NOT DETECTED NOT DETECTED Final   Respiratory Syncytial Virus NOT DETECTED NOT DETECTED Final   Bordetella pertussis NOT DETECTED NOT DETECTED Final   Chlamydophila pneumoniae NOT DETECTED NOT DETECTED  Final   Mycoplasma pneumoniae NOT DETECTED NOT DETECTED Final    Comment: Performed at [facility], [address] [place], [place] [address]  SARS Coronavirus 2 [facility](Hospital order, Performed in [facility] hospital lab)     Status: None   Collection Time: 02/19/19  6:33 PM  Result Value Ref Range Status   SARS Coronavirus 2 NEGATIVE NEGATIVE Final    Comment: (NOTE) If result is NEGATIVE SARS-CoV-2 target nucleic acids are NOT DETECTED. The SARS-CoV-2 RNA is generally detectable in upper and lower  respiratory specimens during the acute phase of infection. The lowest  concentration of SARS-CoV-2 viral copies this assay can detect is 250  copies / mL. A negative result does not preclude SARS-CoV-2 infection  and should not be used as the sole basis for treatment or other  patient management decisions.  A negative result may occur with  improper specimen collection / handling, submission of specimen other  than nasopharyngeal swab, presence of viral mutation(s) within the  areas targeted by this assay, and inadequate number of viral copies  (<250 copies / mL). A negative result must be combined with clinical  observations, patient history, and epidemiological information. If result is POSITIVE SARS-CoV-2 target nucleic acids are DETECTED. The SARS-CoV-2 RNA is generally detectable in upper and lower  respiratory specimens dur ing  the acute phase of infection.  Positive  results are indicative of active infection with SARS-CoV-2.  Clinical  correlation with patient history and other diagnostic information is  necessary to determine patient infection status.  Positive results do  not rule out bacterial infection or co-infection with other viruses. If result is PRESUMPTIVE POSTIVE SARS-CoV-2 nucleic acids MAY BE PRESENT.   A presumptive positive result was obtained on the submitted specimen  and confirmed on repeat testing.  While 2019 novel coronavirus  (SARS-CoV-2) nucleic acids  may be present in the submitted sample  additional confirmatory testing may be necessary for epidemiological  and / or clinical management purposes  to differentiate between  SARS-CoV-2 and other Sarbecovirus currently known to infect humans.  If clinically indicated additional testing with an alternate test  methodology [phone]) is advised. The SARS-CoV-2 RNA is generally  detectable in upper and lower respiratory sp ecimens during the acute  phase of infection. The expected result is Negative. Fact Sheet for Patients:  BoilerBrush.com.cy Fact Sheet for Healthcare Providers: https://pope.com/ This test is not yet approved or cleared by the [place] FDA and has been authorized for detection and/or diagnosis of SARS-CoV-2 by FDA under an Emergency Use Authorization (EUA).  This EUA will remain in effect (meaning this test can be used) for the duration of the COVID-19 declaration under Section 564(b)(1) of the Act, 21 U.S.C. section 360bbb-3(b)(1), unless the authorization is terminated or revoked sooner. Performed at [facility], [address] [place], [place] [address]   Surgical PCR screen     Status: None   Collection Time: 02/22/19  7:36 AM  Result Value Ref Range Status   MRSA, PCR NEGATIVE NEGATIVE Final   Staphylococcus aureus NEGATIVE NEGATIVE Final    Comment: (NOTE) The Xpert SA Assay (FDA approved for NASAL specimens in patients 60 years of age and older), is one component of a comprehensive surveillance program. It is not intended to diagnose infection nor to guide or monitor treatment. Performed at [facility], [address], [place], [place] [address]     Anti-infectives:  Anti-infectives (From admission, onward)   None      Best Practice/Protocols:  VTE Prophylaxis: Mechanical Continous Sedation  Consults:     Studies:    Events:  Subjective:    Overnight Issues:    Objective:  Vital signs for last 24 hours: Temp:  [97.9 F (36.6 C)-99.4 F (37.4 C)] 98.8 F (37.1 C) (04/24 0400) Pulse Rate:  [60-146] 102 (04/24 0713) Resp:  [9-42] 21 (04/24 0713) BP: (115-204)/(45-178) 164/86 (04/24 0713) SpO2:  [68 %-100 %] 98 % (04/24 0713) FiO2 (%):  [40 %] 40 % (04/23 0825) Weight:  [112.2 kg] 112.2 kg (04/24 0500)  Hemodynamic parameters for last 24 hours:    Intake/Output from previous day: 04/23 0701 - 04/24 0700 In: 4495.6 [I.V.:4249.6; NG/GT:40; IV Piggyback:206] Out: 1540 [Urine:1340; Emesis/NG output:200]  Intake/Output this shift: No intake/output data recorded.  Vent settings for last 24 hours: Vent Mode: PSV;CPAP FiO2 (%):  [40 %] 40 % PEEP:  [5 cmH20] 5 cmH20 Pressure Support:  [5 cmH20] 5 cmH20  Physical Exam:  General: agitated Neuro: follows some commands, confused speech HEENT/Neck: no JVD Resp: clear to auscultation bilaterally CVS: RRR GI: soft, dressing dry, +BS Extremities: edema 1+  Results for orders placed or performed during the hospital encounter of 02/18/19 (from the past 24 hour(s))  Glucose, capillary     Status: None   Collection Time: 02/23/19  1:08 PM  Result Value Ref Range   Glucose-Capillary 86 70 - 99 mg/dL   Comment 1 Document in Chart   Glucose, capillary     Status: None   Collection Time: 02/23/19  3:46 PM  Result Value Ref Range   Glucose-Capillary 98 70 - 99 mg/dL  Glucose, capillary     Status: None   Collection Time: 02/23/19  7:35 PM  Result Value Ref Range   Glucose-Capillary 98 70 - 99 mg/dL  Glucose, capillary     Status: None   Collection Time: 02/23/19 11:23 PM  Result Value Ref Range   Glucose-Capillary 85 70 - 99 mg/dL  Glucose, capillary     Status: None   Collection Time: 02/24/19  3:23 AM  Result Value Ref Range   Glucose-Capillary 86 70 - 99 mg/dL    Assessment & Plan: Present on Admission: . Liver laceration    LOS: 6 days   Additional comments:I reviewed the  patient's new clinical lab test results. Marland Kitchen PHBC  Grade 4 liver laceration - S/P exploratory laparotomy, hepatorraphy, packing of liver 4/19 by Dr. Luisa Hart, S/P ex lap, removal of packs and partial closure 4/20 by Dr. Janee Morn. S/P ex lap and closure 4/22 by Dr. Janee Morn. L rib FX 2-3 Acute hypoxic respiratory failure - doing fair since extubation, follow closely, wean O2 as able ETOH abuse disorder/acute alcohol withdrawal - Precedex, Ativan, CSW eval once extubated C spine - plan to examine once more alert ABL anemia - CBC pending FEN - BMET pending, place Cortrak and resume TF VTE - PAS, consider Lovenox tomorrow Dispo - ICU Critical Care Total Time*: 25 Minutes  Violeta Gelinas, MD, MPH, FACS Trauma: [phone] General Surgery: [phone]  02/24/2019  *Care during the described time interval was provided by me. I have reviewed this patient's available data, including medical history, events of note, physical examination and test results as part of my evaluation.

## 2019-02-24 NOTE — Procedures (Signed)
Cortrak  Person Inserting Tube:  Avrianna Smart, RD Tube Type:  Cortrak - 43 inches Tube Location:  Right nare Initial Placement:  Stomach Secured by: Bridle Technique Used to Measure Tube Placement:  Documented cm marking at nare/ corner of mouth Cortrak Secured At:  75 cm   No x-ray is required. RN may begin using tube.   If the tube becomes dislodged please keep the tube and contact the Cortrak team at [email or web address] ([email or web address]) for replacement.  If after hours and replacement cannot be delayed, place a NG tube and confirm placement with an abdominal x-ray.    Jaymarion Trombly RD, LDN Clinical Nutrition Pager # - [phone]    

## 2019-02-25 ENCOUNTER — Inpatient Hospital Stay (HOSPITAL_COMMUNITY): Payer: Medicaid Other

## 2019-02-25 ENCOUNTER — Encounter (HOSPITAL_COMMUNITY): Payer: Self-pay | Admitting: Anesthesiology

## 2019-02-25 ENCOUNTER — Inpatient Hospital Stay (HOSPITAL_COMMUNITY): Payer: Medicaid Other | Admitting: Certified Registered"

## 2019-02-25 LAB — GLUCOSE, CAPILLARY
Glucose-Capillary: 97 mg/dL (ref 70–99)
Glucose-Capillary: 119 mg/dL — ABNORMAL HIGH (ref 70–99)
Glucose-Capillary: 106 mg/dL — ABNORMAL HIGH (ref 70–99)
Glucose-Capillary: 90 mg/dL (ref 70–99)
Glucose-Capillary: 118 mg/dL — ABNORMAL HIGH (ref 70–99)
Glucose-Capillary: 101 mg/dL — ABNORMAL HIGH (ref 70–99)

## 2019-02-25 LAB — POCT I-STAT 7, (LYTES, BLD GAS, ICA,H+H)
Acid-Base Excess: 2 mmol/L (ref 0.0–2.0)
Bicarbonate: 24.7 mmol/L (ref 20.0–28.0)
Calcium, Ion: 1.13 mmol/L — ABNORMAL LOW (ref 1.15–1.40)
HCT: 27 % — ABNORMAL LOW (ref 39.0–52.0)
Hemoglobin: 9.2 g/dL — ABNORMAL LOW (ref 13.0–17.0)
O2 Saturation: 100 %
Patient temperature: 102.5
Potassium: 3.8 mmol/L (ref 3.5–5.1)
Sodium: 139 mmol/L (ref 135–145)
TCO2: 26 mmol/L (ref 22–32)
pCO2 arterial: 34.7 mmHg (ref 32.0–48.0)
pH, Arterial: 7.468 — ABNORMAL HIGH (ref 7.350–7.450)
pO2, Arterial: 300 mmHg — ABNORMAL HIGH (ref 83.0–108.0)

## 2019-02-25 LAB — CBC
HCT: 33.1 % — ABNORMAL LOW (ref 39.0–52.0)
Hemoglobin: 10.9 g/dL — ABNORMAL LOW (ref 13.0–17.0)
MCH: 31.4 pg (ref 26.0–34.0)
MCHC: 32.9 g/dL (ref 30.0–36.0)
MCV: 95.4 fL (ref 80.0–100.0)
Platelets: 219 10*3/uL (ref 150–400)
RBC: 3.47 MIL/uL — ABNORMAL LOW (ref 4.22–5.81)
RDW: 12.8 % (ref 11.5–15.5)
WBC: 10.7 10*3/uL — ABNORMAL HIGH (ref 4.0–10.5)
nRBC: 0 % (ref 0.0–0.2)

## 2019-02-25 LAB — BASIC METABOLIC PANEL
Anion gap: 10 (ref 5–15)
BUN: 5 mg/dL — ABNORMAL LOW (ref 6–20)
CO2: 23 mmol/L (ref 22–32)
Calcium: 8.2 mg/dL — ABNORMAL LOW (ref 8.9–10.3)
Chloride: 108 mmol/L (ref 98–111)
Creatinine, Ser: 0.67 mg/dL (ref 0.61–1.24)
GFR calc Af Amer: >60 mL/min (ref >60)
GFR calc non Af Amer: >60 mL/min (ref >60)
Glucose, Bld: 108 mg/dL — ABNORMAL HIGH (ref 70–99)
Potassium: 3.3 mmol/L — ABNORMAL LOW (ref 3.5–5.1)
Sodium: 141 mmol/L (ref 135–145)

## 2019-02-25 LAB — PHOSPHORUS
Phosphorus: 3.8 mg/dL (ref 2.5–4.6)
Phosphorus: 3.4 mg/dL (ref 2.5–4.6)

## 2019-02-25 LAB — TRIGLYCERIDES: Triglycerides: 213 mg/dL — ABNORMAL HIGH (ref <150)

## 2019-02-25 MED ORDER — VANCOMYCIN HCL 10 G IV SOLR
2000.0000 mg | Freq: Two times a day (BID) | INTRAVENOUS | Status: DC
  Administered 2019-02-25 – 2019-02-27 (×4): 2000 mg via INTRAVENOUS
  Filled 2019-02-25 (×6): qty 2000

## 2019-02-25 MED ORDER — ENOXAPARIN SODIUM 40 MG/0.4ML ~~LOC~~ SOLN
40.0000 mg | SUBCUTANEOUS | Status: DC
  Administered 2019-02-25 – 2019-03-11 (×15): 40 mg via SUBCUTANEOUS
  Filled 2019-02-25 (×15): qty 0.4

## 2019-02-25 MED ORDER — PROPOFOL 10 MG/ML IV BOLUS
INTRAVENOUS | Status: DC | PRN
  Administered 2019-02-25: 100 mg via INTRAVENOUS

## 2019-02-25 MED ORDER — FENTANYL 2500MCG IN NS 250ML (10MCG/ML) PREMIX INFUSION
0.0000 ug/h | INTRAVENOUS | Status: DC
  Administered 2019-02-25: 13:00:00 100 ug/h via INTRAVENOUS
  Administered 2019-02-25: 400 ug/h via INTRAVENOUS
  Administered 2019-02-26: 08:00:00 375 ug/h via INTRAVENOUS
  Administered 2019-02-26: 14:00:00 325 ug/h via INTRAVENOUS
  Administered 2019-02-26: 02:00:00 375 ug/h via INTRAVENOUS
  Administered 2019-02-26: 20:00:00 350 ug/h via INTRAVENOUS
  Administered 2019-02-27 (×2): 400 ug/h via INTRAVENOUS
  Administered 2019-02-27: 200 ug/h via INTRAVENOUS
  Administered 2019-02-27: 02:00:00 350 ug/h via INTRAVENOUS
  Administered 2019-02-28: 375 ug/h via INTRAVENOUS
  Administered 2019-02-28 – 2019-03-01 (×6): 400 ug/h via INTRAVENOUS
  Administered 2019-03-01: 08:00:00 300 ug/h via INTRAVENOUS
  Administered 2019-03-02 (×4): 400 ug/h via INTRAVENOUS
  Administered 2019-03-03: 18:00:00 200 ug/h via INTRAVENOUS
  Administered 2019-03-03 (×2): 400 ug/h via INTRAVENOUS
  Administered 2019-03-04 – 2019-03-05 (×3): 300 ug/h via INTRAVENOUS
  Administered 2019-03-05: 20:00:00 350 ug/h via INTRAVENOUS
  Administered 2019-03-05 – 2019-03-06 (×2): 300 ug/h via INTRAVENOUS
  Administered 2019-03-06: 03:00:00 350 ug/h via INTRAVENOUS
  Filled 2019-02-25 (×31): qty 250

## 2019-02-25 MED ORDER — DEXMEDETOMIDINE HCL IN NACL 400 MCG/100ML IV SOLN
0.4000 ug/kg/h | INTRAVENOUS | Status: DC
  Administered 2019-02-26: 0.6 ug/kg/h via INTRAVENOUS
  Administered 2019-02-26: 18:00:00 1.2 ug/kg/h via INTRAVENOUS
  Administered 2019-02-27 – 2019-03-03 (×50): 2 ug/kg/h via INTRAVENOUS
  Administered 2019-03-03: 1.9 ug/kg/h via INTRAVENOUS
  Administered 2019-03-03 – 2019-03-05 (×22): 2 ug/kg/h via INTRAVENOUS
  Administered 2019-03-05: 1.8 ug/kg/h via INTRAVENOUS
  Administered 2019-03-05 – 2019-03-06 (×17): 2 ug/kg/h via INTRAVENOUS
  Administered 2019-03-07: 10:00:00 1.7 ug/kg/h via INTRAVENOUS
  Administered 2019-03-07 (×2): 2 ug/kg/h via INTRAVENOUS
  Administered 2019-03-07: 1.1 ug/kg/h via INTRAVENOUS
  Administered 2019-03-07 (×2): 2 ug/kg/h via INTRAVENOUS
  Filled 2019-02-25 (×2): qty 100
  Filled 2019-02-25: qty 600
  Filled 2019-02-25 (×5): qty 100
  Filled 2019-02-25: qty 400
  Filled 2019-02-25 (×2): qty 200
  Filled 2019-02-25: qty 100
  Filled 2019-02-25: qty 200
  Filled 2019-02-25 (×5): qty 100
  Filled 2019-02-25: qty 300
  Filled 2019-02-25: qty 200
  Filled 2019-02-25: qty 100
  Filled 2019-02-25: qty 200
  Filled 2019-02-25 (×2): qty 100
  Filled 2019-02-25: qty 200
  Filled 2019-02-25: qty 100
  Filled 2019-02-25 (×2): qty 200
  Filled 2019-02-25: qty 400
  Filled 2019-02-25 (×3): qty 100
  Filled 2019-02-25 (×2): qty 200
  Filled 2019-02-25: qty 100
  Filled 2019-02-25: qty 200
  Filled 2019-02-25 (×4): qty 100
  Filled 2019-02-25: qty 600
  Filled 2019-02-25: qty 100
  Filled 2019-02-25: qty 400
  Filled 2019-02-25 (×7): qty 100
  Filled 2019-02-25: qty 200
  Filled 2019-02-25 (×2): qty 100
  Filled 2019-02-25: qty 200
  Filled 2019-02-25 (×2): qty 100
  Filled 2019-02-25: qty 200
  Filled 2019-02-25 (×2): qty 100
  Filled 2019-02-25: qty 200

## 2019-02-25 MED ORDER — PIVOT 1.5 CAL PO LIQD
1000.0000 mL | ORAL | Status: DC
  Administered 2019-02-25: 15:00:00 1000 mL

## 2019-02-25 MED ORDER — IBUPROFEN 100 MG/5ML PO SUSP
600.0000 mg | Freq: Four times a day (QID) | ORAL | Status: DC | PRN
  Administered 2019-02-25 – 2019-02-26 (×2): 600 mg
  Filled 2019-02-25 (×2): qty 30

## 2019-02-25 MED ORDER — PROPOFOL 1000 MG/100ML IV EMUL
5.0000 ug/kg/min | INTRAVENOUS | Status: DC
  Administered 2019-02-25: 40 ug/kg/min via INTRAVENOUS
  Administered 2019-02-25: 21:00:00 25 ug/kg/min via INTRAVENOUS
  Administered 2019-02-26: 30 ug/kg/min via INTRAVENOUS
  Administered 2019-02-26: 13:00:00 35 ug/kg/min via INTRAVENOUS
  Administered 2019-02-26 – 2019-02-27 (×4): 30 ug/kg/min via INTRAVENOUS
  Administered 2019-02-27: 07:00:00 15 ug/kg/min via INTRAVENOUS
  Administered 2019-02-27: 17:00:00 30 ug/kg/min via INTRAVENOUS
  Administered 2019-02-27: 10 ug/kg/min via INTRAVENOUS
  Administered 2019-02-28: 06:00:00 25 ug/kg/min via INTRAVENOUS
  Administered 2019-02-28 (×2): 30 ug/kg/min via INTRAVENOUS
  Filled 2019-02-25 (×11): qty 100

## 2019-02-25 MED ORDER — SUCCINYLCHOLINE CHLORIDE 20 MG/ML IJ SOLN
INTRAMUSCULAR | Status: DC | PRN
  Administered 2019-02-25: 100 mg via INTRAVENOUS

## 2019-02-25 MED ORDER — PROPOFOL 1000 MG/100ML IV EMUL
INTRAVENOUS | Status: AC
  Administered 2019-02-25: 12:00:00
  Filled 2019-02-25: qty 100

## 2019-02-25 MED ORDER — POTASSIUM CHLORIDE 20 MEQ/15ML (10%) PO SOLN
40.0000 meq | Freq: Two times a day (BID) | ORAL | Status: DC
  Administered 2019-02-25 – 2019-03-08 (×23): 40 meq
  Filled 2019-02-25 (×23): qty 30

## 2019-02-25 MED ORDER — PIPERACILLIN-TAZOBACTAM 3.375 G IVPB
3.3750 g | Freq: Three times a day (TID) | INTRAVENOUS | Status: DC
  Administered 2019-02-25 – 2019-02-27 (×5): 3.375 g via INTRAVENOUS
  Filled 2019-02-25 (×6): qty 50

## 2019-02-25 MED ORDER — POTASSIUM CHLORIDE 10 MEQ/100ML IV SOLN
10.0000 meq | INTRAVENOUS | Status: AC
  Administered 2019-02-25 (×4): 10 meq via INTRAVENOUS
  Filled 2019-02-25 (×3): qty 100

## 2019-02-25 NOTE — Progress Notes (Signed)
Patients ETT tube advanced 2 cm per verbal order. ETT tube is now at 27 at the lip.

## 2019-02-25 NOTE — Progress Notes (Signed)
Patient ID: Jeremy [name], male   DOB: [birth year], 23 y.o.   MRN: [id number] Follow up - Trauma Critical Care  Patient Details:    Jeremy [name] is an 23 y.o. male.   Best Practice/Protocols:  VTE Prophylaxis: Mechanical Continous Sedation   Subjective:    Overnight Issues: tachypnea.  5L UOP.  Objective:  Vital signs for last 24 hours: Temp:  [100.7 F (38.2 C)-102.1 F (38.9 C)] 102.1 F (38.9 C) (04/25 0800) Pulse Rate:  [79-99] 83 (04/25 0800) Resp:  [20-47] 34 (04/25 0800) BP: (151-184)/(83-115) 151/102 (04/25 0800) SpO2:  [96 %-100 %] 99 % (04/25 0800) FiO2 (%):  [100 %] 100 % (04/24 1528) Weight:  [104.4 kg] 104.4 kg (04/25 0500)  Hemodynamic parameters for last 24 hours:    Intake/Output from previous day: 04/24 0701 - 04/25 0700 In: 4543.4 [I.V.:4148.4; NG/GT:395] Out: 8300 [Urine:8300]  Intake/Output this shift: Total I/O In: 291 [I.V.:291] Out: -   Vent settings for last 24 hours: FiO2 (%):  [100 %] 100 %  Physical Exam:  General: sedated, just received ativan.   Neuro: followed some commands for RN.   HEENT/Neck: no JVD Resp: coarse bilaterally.  RR 26-50.   CVS: RRR GI: soft, dressing dry Extremities: edema 1+  Results for orders placed or performed during the hospital encounter of 02/18/19 (from the past 24 hour(s))  Basic metabolic panel     Status: Abnormal   Collection Time: 02/24/19  9:22 AM  Result Value Ref Range   Sodium 140 135 - 145 mmol/L   Potassium 3.3 (L) 3.5 - 5.1 mmol/L   Chloride 107 98 - 111 mmol/L   CO2 23 22 - 32 mmol/L   Glucose, Bld 95 70 - 99 mg/dL   BUN 5 (L) 6 - 20 mg/dL   Creatinine, Ser 8.290.65 0.61 - 1.24 mg/dL   Calcium 8.0 (L) 8.9 - 10.3 mg/dL   GFR calc non Af Amer >60 >60 mL/min   GFR calc Af Amer >60 >60 mL/min   Anion gap 10 5 - 15  CBC     Status: Abnormal   Collection Time: 02/24/19  9:22 AM  Result Value Ref Range   WBC 10.8 (H) 4.0 - 10.5 K/uL   RBC 3.21 (L) 4.22 - 5.81 MIL/uL   Hemoglobin 10.3 (L)  13.0 - 17.0 g/dL   HCT 56.230.4 (L) 13.039.0 - 86.552.0 %   MCV 94.7 80.0 - 100.0 fL   MCH 32.1 26.0 - 34.0 pg   MCHC 33.9 30.0 - 36.0 g/dL   RDW 78.412.7 69.611.5 - 29.515.5 %   Platelets 170 150 - 400 K/uL   nRBC 0.0 0.0 - 0.2 %  Magnesium     Status: Abnormal   Collection Time: 02/24/19  9:22 AM  Result Value Ref Range   Magnesium 1.5 (L) 1.7 - 2.4 mg/dL  Glucose, capillary     Status: None   Collection Time: 02/24/19 12:21 PM  Result Value Ref Range   Glucose-Capillary 92 70 - 99 mg/dL  Glucose, capillary     Status: None   Collection Time: 02/24/19  3:29 PM  Result Value Ref Range   Glucose-Capillary 89 70 - 99 mg/dL  Phosphorus     Status: None   Collection Time: 02/24/19  5:00 PM  Result Value Ref Range   Phosphorus 2.7 2.5 - 4.6 mg/dL  Glucose, capillary     Status: None   Collection Time: 02/24/19  8:20 PM  Result Value Ref Range  Glucose-Capillary 76 70 - 99 mg/dL  Glucose, capillary     Status: None   Collection Time: 02/24/19 11:33 PM  Result Value Ref Range   Glucose-Capillary 94 70 - 99 mg/dL  Glucose, capillary     Status: None   Collection Time: 02/25/19  3:44 AM  Result Value Ref Range   Glucose-Capillary 97 70 - 99 mg/dL  Phosphorus     Status: None   Collection Time: 02/25/19  5:17 AM  Result Value Ref Range   Phosphorus 3.8 2.5 - 4.6 mg/dL  CBC     Status: Abnormal   Collection Time: 02/25/19  5:17 AM  Result Value Ref Range   WBC 10.7 (H) 4.0 - 10.5 K/uL   RBC 3.47 (L) 4.22 - 5.81 MIL/uL   Hemoglobin 10.9 (L) 13.0 - 17.0 g/dL   HCT 16.1 (L) 09.6 - 04.5 %   MCV 95.4 80.0 - 100.0 fL   MCH 31.4 26.0 - 34.0 pg   MCHC 32.9 30.0 - 36.0 g/dL   RDW 40.9 81.1 - 91.4 %   Platelets 219 150 - 400 K/uL   nRBC 0.0 0.0 - 0.2 %  Basic metabolic panel     Status: Abnormal   Collection Time: 02/25/19  5:17 AM  Result Value Ref Range   Sodium 141 135 - 145 mmol/L   Potassium 3.3 (L) 3.5 - 5.1 mmol/L   Chloride 108 98 - 111 mmol/L   CO2 23 22 - 32 mmol/L   Glucose, Bld 108 (H)  70 - 99 mg/dL   BUN 5 (L) 6 - 20 mg/dL   Creatinine, Ser 7.82 0.61 - 1.24 mg/dL   Calcium 8.2 (L) 8.9 - 10.3 mg/dL   GFR calc non Af Amer >60 >60 mL/min   GFR calc Af Amer >60 >60 mL/min   Anion gap 10 5 - 15  Glucose, capillary     Status: Abnormal   Collection Time: 02/25/19  8:36 AM  Result Value Ref Range   Glucose-Capillary 119 (H) 70 - 99 mg/dL   Comment 1 Notify RN    Comment 2 Document in Chart     Assessment & Plan: Present on Admission: . Liver laceration    LOS: 7 days   Additional comments:I reviewed the patient's new clinical lab test results. Marland Kitchen PHBC  Grade 4 liver laceration - S/P exploratory laparotomy, hepatorraphy, packing of liver 4/19 by Dr. Luisa Hart, S/P ex lap, removal of packs and partial closure 4/20 by Dr. Janee Morn. S/P ex lap and closure 4/22 by Dr. Janee Morn. L rib FX 2-3.  Pain control.   Acute hypoxic respiratory failure - p CXR much worse today.  Concern for pneumonia.  Pulmonary toilet as pt is able to cooperate.  Foul secretions.  Would not be surprised if he needs reintubating later today.   ETOH abuse disorder/acute alcohol withdrawal - Precedex, Ativan, CSW eval in progress.   C spine - plan to examine once more alert Neuro - dilaudid for pain.  Start CIWA protocol.  precedex gtt.   ABL anemia - stable.   FEN -hypokalemia - replete and add daily KCl via tube.  Tube feeds via Cortrak.   VTE - PAS, add lovenox Dispo - ICU  Global issues - withdrawal and respiratory status biggest issues right now.    Critical Care Total Time*: 37 Minutes  Almond Lint, MD FACS Surgical Oncology, general surgery, Trauma and critical care.    02/25/2019  *Care during the described time interval was provided  by me. I have reviewed this patient's available data, including medical history, events of note, physical examination and test results as part of my evaluation.   Lines/tubes : PICC Triple Lumen 02/22/19 PICC Left Brachial 48 cm 0 cm (Active)  Indication  for Insertion or Continuance of Line Vasoactive infusions 02/23/2019  8:00 PM  Exposed Catheter (cm) 0 cm 02/22/2019  2:31 PM  Site Assessment Clean;Dry;Intact 02/23/2019  8:00 PM  Lumen #1 Status Infusing 02/23/2019  8:00 PM  Lumen #2 Status Infusing 02/23/2019  8:00 PM  Lumen #3 Status In-line blood sampling system in place;Flushed;Saline locked 02/23/2019  8:00 PM  Dressing Type Transparent;Occlusive 02/23/2019  8:00 PM  Dressing Status Clean;Dry;Intact;Antimicrobial disc in place 02/23/2019  8:00 PM  Line Care Lumen 1 tubing changed;Lumen 2 tubing changed;Lumen 3 tubing changed;Connections checked and tightened;Other (Comment) 02/22/2019  4:00 PM  Dressing Change Due 03/01/19 02/23/2019  8:00 PM     External Urinary Catheter (Active)  Collection Container Standard drainage bag 02/23/2019  8:00 PM  Intervention Equipment Changed 02/24/2019  2:00 AM  Output (mL) 350 mL 02/24/2019  6:00 AM    Microbiology/Sepsis markers: Results for orders placed or performed during the hospital encounter of 02/18/19  MRSA PCR Screening     Status: None   Collection Time: 02/19/19  2:02 AM  Result Value Ref Range Status   MRSA by PCR NEGATIVE NEGATIVE Final    Comment:        The GeneXpert MRSA Assay (FDA approved for NASAL specimens only), is one component of a comprehensive MRSA colonization surveillance program. It is not intended to diagnose MRSA infection nor to guide or monitor treatment for MRSA infections. Performed at [facility], [address], [place], [place] [address]   SARS Coronavirus 2 [facility] order, Performed in [facility] hospital lab)     Status: None   Collection Time: 02/19/19  3:20 AM  Result Value Ref Range Status   SARS Coronavirus 2 NEGATIVE NEGATIVE Final    Comment: (NOTE) If result is NEGATIVE SARS-CoV-2 target nucleic acids are NOT DETECTED. The SARS-CoV-2 RNA is generally detectable in upper and lower  respiratory specimens during the acute phase of infection.  The lowest  concentration of SARS-CoV-2 viral copies this assay can detect is 250  copies / mL. A negative result does not preclude SARS-CoV-2 infection  and should not be used as the sole basis for treatment or other  patient management decisions.  A negative result may occur with  improper specimen collection / handling, submission of specimen other  than nasopharyngeal swab, presence of viral mutation(s) within the  areas targeted by this assay, and inadequate number of viral copies  (<250 copies / mL). A negative result must be combined with clinical  observations, patient history, and epidemiological information. If result is POSITIVE SARS-CoV-2 target nucleic acids are DETECTED. The SARS-CoV-2 RNA is generally detectable in upper and lower  respiratory specimens dur ing the acute phase of infection.  Positive  results are indicative of active infection with SARS-CoV-2.  Clinical  correlation with patient history and other diagnostic information is  necessary to determine patient infection status.  Positive results do  not rule out bacterial infection or co-infection with other viruses. If result is PRESUMPTIVE POSTIVE SARS-CoV-2 nucleic acids MAY BE PRESENT.   A presumptive positive result was obtained on the submitted specimen  and confirmed on repeat testing.  While 2019 novel coronavirus  (SARS-CoV-2) nucleic acids may be present in the submitted  sample  additional confirmatory testing may be necessary for epidemiological  and / or clinical management purposes  to differentiate between  SARS-CoV-2 and other Sarbecovirus currently known to infect humans.  If clinically indicated additional testing with an alternate test  methodology [phone]) is advised. The SARS-CoV-2 RNA is generally  detectable in upper and lower respiratory sp ecimens during the acute  phase of infection. The expected result is Negative. Fact Sheet for Patients:   BoilerBrush.com.cy Fact Sheet for Healthcare Providers: https://pope.com/ This test is not yet approved or cleared by the [place] FDA and has been authorized for detection and/or diagnosis of SARS-CoV-2 by FDA under an Emergency Use Authorization (EUA).  This EUA will remain in effect (meaning this test can be used) for the duration of the COVID-19 declaration under Section 564(b)(1) of the Act, 21 U.S.C. section 360bbb-3(b)(1), unless the authorization is terminated or revoked sooner. Performed at [facility], [address], [place], [place] [address]   Respiratory Panel by PCR     Status: None   Collection Time: 02/19/19  3:20 AM  Result Value Ref Range Status   Adenovirus NOT DETECTED NOT DETECTED Final   Coronavirus 229E NOT DETECTED NOT DETECTED Final    Comment: (NOTE) The Coronavirus on the Respiratory Panel, DOES NOT test for the novel  Coronavirus (2019 nCoV)    Coronavirus HKU1 NOT DETECTED NOT DETECTED Final   Coronavirus NL63 NOT DETECTED NOT DETECTED Final   Coronavirus OC43 NOT DETECTED NOT DETECTED Final   Metapneumovirus NOT DETECTED NOT DETECTED Final   Rhinovirus / Enterovirus NOT DETECTED NOT DETECTED Final   Influenza A NOT DETECTED NOT DETECTED Final   Influenza B NOT DETECTED NOT DETECTED Final   Parainfluenza Virus 1 NOT DETECTED NOT DETECTED Final   Parainfluenza Virus 2 NOT DETECTED NOT DETECTED Final   Parainfluenza Virus 3 NOT DETECTED NOT DETECTED Final   Parainfluenza Virus 4 NOT DETECTED NOT DETECTED Final   Respiratory Syncytial Virus NOT DETECTED NOT DETECTED Final   Bordetella pertussis NOT DETECTED NOT DETECTED Final   Chlamydophila pneumoniae NOT DETECTED NOT DETECTED Final   Mycoplasma pneumoniae NOT DETECTED NOT DETECTED Final    Comment: Performed at [facility], [address] [place], [place] [address]  SARS Coronavirus 2 [facility] order, Performed in [facility] hospital  lab)     Status: None   Collection Time: 02/19/19  6:33 PM  Result Value Ref Range Status   SARS Coronavirus 2 NEGATIVE NEGATIVE Final    Comment: (NOTE) If result is NEGATIVE SARS-CoV-2 target nucleic acids are NOT DETECTED. The SARS-CoV-2 RNA is generally detectable in upper and lower  respiratory specimens during the acute phase of infection. The lowest  concentration of SARS-CoV-2 viral copies this assay can detect is 250  copies / mL. A negative result does not preclude SARS-CoV-2 infection  and should not be used as the sole basis for treatment or other  patient management decisions.  A negative result may occur with  improper specimen collection / handling, submission of specimen other  than nasopharyngeal swab, presence of viral mutation(s) within the  areas targeted by this assay, and inadequate number of viral copies  (<250 copies / mL). A negative result must be combined with clinical  observations, patient history, and epidemiological information. If result is POSITIVE SARS-CoV-2 target nucleic acids are DETECTED. The SARS-CoV-2 RNA is generally detectable in upper and lower  respiratory specimens dur ing the acute phase of infection.  Positive  results are indicative of active infection with SARS-CoV-2.  Clinical  correlation with patient history and other diagnostic information is  necessary to determine patient infection status.  Positive results do  not rule out bacterial infection or co-infection with other viruses. If result is PRESUMPTIVE POSTIVE SARS-CoV-2 nucleic acids MAY BE PRESENT.   A presumptive positive result was obtained on the submitted specimen  and confirmed on repeat testing.  While 2019 novel coronavirus  (SARS-CoV-2) nucleic acids may be present in the submitted sample  additional confirmatory testing may be necessary for epidemiological  and / or clinical management purposes  to differentiate between  SARS-CoV-2 and other Sarbecovirus currently  known to infect humans.  If clinically indicated additional testing with an alternate test  methodology [phone]) is advised. The SARS-CoV-2 RNA is generally  detectable in upper and lower respiratory sp ecimens during the acute  phase of infection. The expected result is Negative. Fact Sheet for Patients:  BoilerBrush.com.cy Fact Sheet for Healthcare Providers: https://pope.com/ This test is not yet approved or cleared by the [place] FDA and has been authorized for detection and/or diagnosis of SARS-CoV-2 by FDA under an Emergency Use Authorization (EUA).  This EUA will remain in effect (meaning this test can be used) for the duration of the COVID-19 declaration under Section 564(b)(1) of the Act, 21 U.S.C. section 360bbb-3(b)(1), unless the authorization is terminated or revoked sooner. Performed at [facility], [address] [place], [place] [address]   Surgical PCR screen     Status: None   Collection Time: 02/22/19  7:36 AM  Result Value Ref Range Status   MRSA, PCR NEGATIVE NEGATIVE Final   Staphylococcus aureus NEGATIVE NEGATIVE Final    Comment: (NOTE) The Xpert SA Assay (FDA approved for NASAL specimens in patients 67 years of age and older), is one component of a comprehensive surveillance program. It is not intended to diagnose infection nor to guide or monitor treatment. Performed at [facility], [address] [place], [place] [address]     Anti-infectives:  Anti-infectives (From admission, onward)   None

## 2019-02-25 NOTE — Progress Notes (Signed)
Pharmacy Antibiotic Note  Jeremy [name] is a 22 y.o. male admitted on 02/18/2019 with pneumonia.  Pharmacy has been consulted for vanc/zosyn dosing.  S/p ex lap on 4/19 with closure 4/22 for Grade 4 liver laceration. CXR today showing multilobar PNA. WBC 10.7, Tmax 101.6. Scr 0.67.   Plan: Zosyn 3.375g IV q8h (4 hour infusion). Vancomycin 2 g IV every 12 hours  Monitor renal fx, cx results, clinical pic, and vanc levels as appropriate  Height: 6' (182.9 cm) Weight: 230 lb 2.6 oz (104.4 kg) IBW/kg (Calculated) : 77.6  Temp (24hrs), Avg:101.8 F (38.8 C), Min:101.2 F (38.4 C), Max:102.5 F (39.2 C)  Recent Labs  Lab 02/18/19 2221  02/21/19 0742 02/22/19 0145 02/23/19 0507 02/24/19 0922 02/25/19 0517  WBC 17.9*   < > 7.5 7.7 6.8 10.8* 10.7*  CREATININE 1.22   < > 0.88 0.87 0.79 0.65 0.67  LATICACIDVEN 3.2*  --   --   --   --   --   --    < > = values in this interval not displayed.    Estimated Creatinine Clearance: 179.4 mL/min (by C-G formula based on SCr of 0.67 mg/dL).    No Known Allergies  Antimicrobials this admission: Vanc 4/25 >>  Zosyn 4/25 >>   Dose adjustments this admission: N/A  Microbiology results: 4/25 BCx: sent 4/25 Sputum: no orgs  4/22 MRSA PCR: neg 4/1 Resp panel: neg 4/19 COVID: neg  Thank you for allowing pharmacy to be a part of this patient's care.  Sherron Monday, PharmD, BCCCP Clinical Pharmacist  Pager: [phone] Phone: [phone] 02/25/2019 5:13 PM

## 2019-02-25 NOTE — Anesthesia Procedure Notes (Signed)
Procedure Name: Intubation Date/Time: 02/25/2019 11:05 AM Performed by: Rosiland Oz, CRNA Pre-anesthesia Checklist: Patient identified, Emergency Drugs available, Suction available, Patient being monitored and Timeout performed Patient Re-evaluated:Patient Re-evaluated prior to induction Oxygen Delivery Method: Ambu bag Preoxygenation: Pre-oxygenation with 100% oxygen Induction Type: IV induction and Rapid sequence Laryngoscope Size: Glidescope and 4 Grade View: Grade I Tube type: Subglottic suction tube Tube size: 8.0 mm Number of attempts: 1 Airway Equipment and Method: Stylet Placement Confirmation: ETT inserted through vocal cords under direct vision,  positive ETCO2 and breath sounds checked- equal and bilateral Secured at: 24 cm Tube secured with: Tape Dental Injury: Teeth and Oropharynx as per pre-operative assessment  Difficulty Due To: Difficult Airway- due to cervical collar

## 2019-02-25 NOTE — Progress Notes (Signed)
MD paged as pt fever still 101.4 despite tylenol administration. Temperature turned down in room, Cool wash rags placed on patient, fan ordered. Orders received to consult pharmacy for Vanc and Zosyn abx. Orders received for Motrin 600 mg per tube Q6h prn for fever.  During I and O cath for urine culture, minimal urine output received from patient. Dark amber urine noticed. Urine was NOT foul smelling, and it was clear.Pericare done and new condom cath applied. No new orders.  Will continue to monitor patient closely.  Delories Heinz, RN

## 2019-02-26 LAB — BASIC METABOLIC PANEL
Anion gap: 10 (ref 5–15)
BUN: 9 mg/dL (ref 6–20)
CO2: 21 mmol/L — ABNORMAL LOW (ref 22–32)
Calcium: 7.7 mg/dL — ABNORMAL LOW (ref 8.9–10.3)
Chloride: 109 mmol/L (ref 98–111)
Creatinine, Ser: 0.64 mg/dL (ref 0.61–1.24)
GFR calc Af Amer: >60 mL/min (ref >60)
GFR calc non Af Amer: >60 mL/min (ref >60)
Glucose, Bld: 115 mg/dL — ABNORMAL HIGH (ref 70–99)
Potassium: 3.3 mmol/L — ABNORMAL LOW (ref 3.5–5.1)
Sodium: 140 mmol/L (ref 135–145)

## 2019-02-26 LAB — CBC
HCT: 31.9 % — ABNORMAL LOW (ref 39.0–52.0)
Hemoglobin: 10.3 g/dL — ABNORMAL LOW (ref 13.0–17.0)
MCH: 31.6 pg (ref 26.0–34.0)
MCHC: 32.3 g/dL (ref 30.0–36.0)
MCV: 97.9 fL (ref 80.0–100.0)
Platelets: 260 10*3/uL (ref 150–400)
RBC: 3.26 MIL/uL — ABNORMAL LOW (ref 4.22–5.81)
RDW: 13.3 % (ref 11.5–15.5)
WBC: 9.7 10*3/uL (ref 4.0–10.5)
nRBC: 0 % (ref 0.0–0.2)

## 2019-02-26 LAB — CULTURE, RESPIRATORY

## 2019-02-26 LAB — CULTURE, RESPIRATORY W GRAM STAIN: Gram Stain: NONE SEEN

## 2019-02-26 LAB — GLUCOSE, CAPILLARY
Glucose-Capillary: 94 mg/dL (ref 70–99)
Glucose-Capillary: 93 mg/dL (ref 70–99)
Glucose-Capillary: 100 mg/dL — ABNORMAL HIGH (ref 70–99)
Glucose-Capillary: 115 mg/dL — ABNORMAL HIGH (ref 70–99)
Glucose-Capillary: 99 mg/dL (ref 70–99)
Glucose-Capillary: 138 mg/dL — ABNORMAL HIGH (ref 70–99)

## 2019-02-26 LAB — MAGNESIUM: Magnesium: 2 mg/dL (ref 1.7–2.4)

## 2019-02-26 MED ORDER — PIVOT 1.5 CAL PO LIQD
1000.0000 mL | ORAL | Status: DC
  Administered 2019-02-26 – 2019-03-01 (×4): 1000 mL

## 2019-02-26 MED ORDER — CHLORHEXIDINE GLUCONATE 0.12% ORAL RINSE (MEDLINE KIT)
15.0000 mL | Freq: Two times a day (BID) | OROMUCOSAL | Status: DC
  Administered 2019-02-26 – 2019-03-07 (×15): 15 mL via OROMUCOSAL

## 2019-02-26 MED ORDER — ORAL CARE MOUTH RINSE
15.0000 mL | OROMUCOSAL | Status: DC
  Administered 2019-02-26 – 2019-03-07 (×90): 15 mL via OROMUCOSAL

## 2019-02-26 MED ORDER — SENNOSIDES-DOCUSATE SODIUM 8.6-50 MG PO TABS
1.0000 | ORAL_TABLET | Freq: Two times a day (BID) | ORAL | Status: DC | PRN
  Administered 2019-02-26: 1 via ORAL
  Filled 2019-02-26: qty 1

## 2019-02-26 MED ORDER — IPRATROPIUM-ALBUTEROL 0.5-2.5 (3) MG/3ML IN SOLN
3.0000 mL | Freq: Four times a day (QID) | RESPIRATORY_TRACT | Status: DC
  Administered 2019-02-26 – 2019-03-06 (×33): 3 mL via RESPIRATORY_TRACT
  Filled 2019-02-26 (×24): qty 3

## 2019-02-26 MED ORDER — POLYETHYLENE GLYCOL 3350 17 G PO PACK
17.0000 g | PACK | Freq: Two times a day (BID) | ORAL | Status: DC
  Administered 2019-02-26 – 2019-03-09 (×9): 17 g via ORAL
  Filled 2019-02-26 (×12): qty 1

## 2019-02-26 MED ORDER — BISACODYL 10 MG RE SUPP: 10.0000 mg | Freq: Every day | RECTAL | Status: DC | PRN

## 2019-02-26 NOTE — Progress Notes (Signed)
Patient ID: Jeremy [name], male   DOB: 1996/10/22, 23 y.o.   MRN: [id number] Follow up - Trauma Critical Care  Patient Details:    Jeremy [name] is an 23 y.o. male.  Lines/tubes : Airway 8 mm (Active)  Secured at (cm) 27 cm 02/26/2019  4:54 AM  Measured From Lips 02/26/2019  4:54 AM  Secured Location Left 02/26/2019  4:54 AM  Secured By [employer] Tube Holder 02/26/2019  4:54 AM  Tube Holder Repositioned Yes 02/26/2019  4:54 AM  Cuff Pressure (cm H2O) 28 cm H2O 02/25/2019  8:28 PM  Site Condition Dry 02/26/2019  4:54 AM     PICC Triple Lumen 02/22/19 PICC Left Brachial 48 cm 0 cm (Active)  Indication for Insertion or Continuance of Line Prolonged intravenous therapies;Limited venous access - need for IV therapy >5 days (PICC only) 02/26/2019  8:00 AM  Exposed Catheter (cm) 0 cm 02/22/2019  2:31 PM  Site Assessment Clean;Dry;Intact 02/26/2019  8:00 AM  Lumen #1 Status Infusing 02/26/2019  8:00 AM  Lumen #2 Status Infusing 02/26/2019  8:00 AM  Lumen #3 Status In-line blood sampling system in place 02/26/2019  8:00 AM  Dressing Type Transparent;Occlusive 02/26/2019  8:00 AM  Dressing Status Clean;Dry;Intact;Antimicrobial disc in place 02/26/2019  8:00 AM  Line Care Connections checked and tightened;Zeroed and calibrated;Leveled 02/26/2019  8:00 AM  Dressing Intervention Dressing reinforced 02/24/2019  8:00 AM  Dressing Change Due 03/01/19 02/26/2019  8:00 AM     External Urinary Catheter (Active)  Collection Container Standard drainage bag 02/26/2019  4:00 AM  Securement Method Leg strap 02/26/2019  4:00 AM  Intervention Equipment Changed 02/26/2019  4:00 AM  Output (mL) 550 mL 02/26/2019  4:00 AM    Microbiology/Sepsis markers: Results for orders placed or performed during the hospital encounter of 02/18/19  MRSA PCR Screening     Status: None   Collection Time: 02/19/19  2:02 AM  Result Value Ref Range Status   MRSA by PCR NEGATIVE NEGATIVE Final    Comment:        The GeneXpert MRSA Assay  (FDA approved for NASAL specimens only), is one component of a comprehensive MRSA colonization surveillance program. It is not intended to diagnose MRSA infection nor to guide or monitor treatment for MRSA infections. Performed at [facility], [address] [place], [place] [id number]   SARS Coronavirus 2 [facility](Hospital order, Performed in [facility])     Status: None   Collection Time: 02/19/19  3:20 AM  Result Value Ref Range Status   SARS Coronavirus 2 NEGATIVE NEGATIVE Final    Comment: (NOTE) If result is NEGATIVE SARS-CoV-2 target nucleic acids are NOT DETECTED. The SARS-CoV-2 RNA is generally detectable in upper and lower  respiratory specimens during the acute phase of infection. The lowest  concentration of SARS-CoV-2 viral copies this assay can detect is 250  copies / mL. A negative result does not preclude SARS-CoV-2 infection  and should not be used as the sole basis for treatment or other  patient management decisions.  A negative result may occur with  improper specimen collection / handling, submission of specimen other  than nasopharyngeal swab, presence of viral mutation(s) within the  areas targeted by this assay, and inadequate number of viral copies  (<250 copies / mL). A negative result must be combined with clinical  observations, patient history, and epidemiological information. If result is POSITIVE SARS-CoV-2 target nucleic acids are DETECTED. The SARS-CoV-2 RNA is generally detectable in upper  and lower  respiratory specimens dur ing the acute phase of infection.  Positive  results are indicative of active infection with SARS-CoV-2.  Clinical  correlation with patient history and other diagnostic information is  necessary to determine patient infection status.  Positive results do  not rule out bacterial infection or co-infection with other viruses. If result is PRESUMPTIVE POSTIVE SARS-CoV-2 nucleic acids MAY BE PRESENT.   A  presumptive positive result was obtained on the submitted specimen  and confirmed on repeat testing.  While 2019 novel coronavirus  (SARS-CoV-2) nucleic acids may be present in the submitted sample  additional confirmatory testing may be necessary for epidemiological  and / or clinical management purposes  to differentiate between  SARS-CoV-2 and other Sarbecovirus currently known to infect humans.  If clinically indicated additional testing with an alternate test  methodology [phone]) is advised. The SARS-CoV-2 RNA is generally  detectable in upper and lower respiratory sp ecimens during the acute  phase of infection. The expected result is Negative. Fact Sheet for Patients:  BoilerBrush.com.cy Fact Sheet for Healthcare Providers: https://pope.com/ This test is not yet approved or cleared by the [place] FDA and has been authorized for detection and/or diagnosis of SARS-CoV-2 by FDA under an Emergency Use Authorization (EUA).  This EUA will remain in effect (meaning this test can be used) for the duration of the COVID-19 declaration under Section 564(b)(1) of the Act, 21 U.S.C. section 360bbb-3(b)(1), unless the authorization is terminated or revoked sooner. Performed at [facility], [address] [place], [place] [address]   Respiratory Panel by PCR     Status: None   Collection Time: 02/19/19  3:20 AM  Result Value Ref Range Status   Adenovirus NOT DETECTED NOT DETECTED Final   Coronavirus 229E NOT DETECTED NOT DETECTED Final    Comment: (NOTE) The Coronavirus on the Respiratory Panel, DOES NOT test for the novel  Coronavirus (2019 nCoV)    Coronavirus HKU1 NOT DETECTED NOT DETECTED Final   Coronavirus NL63 NOT DETECTED NOT DETECTED Final   Coronavirus OC43 NOT DETECTED NOT DETECTED Final   Metapneumovirus NOT DETECTED NOT DETECTED Final   Rhinovirus / Enterovirus NOT DETECTED NOT DETECTED Final   Influenza A NOT  DETECTED NOT DETECTED Final   Influenza B NOT DETECTED NOT DETECTED Final   Parainfluenza Virus 1 NOT DETECTED NOT DETECTED Final   Parainfluenza Virus 2 NOT DETECTED NOT DETECTED Final   Parainfluenza Virus 3 NOT DETECTED NOT DETECTED Final   Parainfluenza Virus 4 NOT DETECTED NOT DETECTED Final   Respiratory Syncytial Virus NOT DETECTED NOT DETECTED Final   Bordetella pertussis NOT DETECTED NOT DETECTED Final   Chlamydophila pneumoniae NOT DETECTED NOT DETECTED Final   Mycoplasma pneumoniae NOT DETECTED NOT DETECTED Final    Comment: Performed at [facility], [address] [place], [place] [address]  SARS Coronavirus 2 [facility] order, Performed in [facility] hospital lab)     Status: None   Collection Time: 02/19/19  6:33 PM  Result Value Ref Range Status   SARS Coronavirus 2 NEGATIVE NEGATIVE Final    Comment: (NOTE) If result is NEGATIVE SARS-CoV-2 target nucleic acids are NOT DETECTED. The SARS-CoV-2 RNA is generally detectable in upper and lower  respiratory specimens during the acute phase of infection. The lowest  concentration of SARS-CoV-2 viral copies this assay can detect is 250  copies / mL. A negative result does not preclude SARS-CoV-2 infection  and should not be used as the  sole basis for treatment or other  patient management decisions.  A negative result may occur with  improper specimen collection / handling, submission of specimen other  than nasopharyngeal swab, presence of viral mutation(s) within the  areas targeted by this assay, and inadequate number of viral copies  (<250 copies / mL). A negative result must be combined with clinical  observations, patient history, and epidemiological information. If result is POSITIVE SARS-CoV-2 target nucleic acids are DETECTED. The SARS-CoV-2 RNA is generally detectable in upper and lower  respiratory specimens dur ing the acute phase of infection.  Positive  results are indicative of active infection with  SARS-CoV-2.  Clinical  correlation with patient history and other diagnostic information is  necessary to determine patient infection status.  Positive results do  not rule out bacterial infection or co-infection with other viruses. If result is PRESUMPTIVE POSTIVE SARS-CoV-2 nucleic acids MAY BE PRESENT.   A presumptive positive result was obtained on the submitted specimen  and confirmed on repeat testing.  While 2019 novel coronavirus  (SARS-CoV-2) nucleic acids may be present in the submitted sample  additional confirmatory testing may be necessary for epidemiological  and / or clinical management purposes  to differentiate between  SARS-CoV-2 and other Sarbecovirus currently known to infect humans.  If clinically indicated additional testing with an alternate test  methodology [phone]) is advised. The SARS-CoV-2 RNA is generally  detectable in upper and lower respiratory sp ecimens during the acute  phase of infection. The expected result is Negative. Fact Sheet for Patients:  BoilerBrush.com.cy Fact Sheet for Healthcare Providers: https://pope.com/ This test is not yet approved or cleared by the [place] FDA and has been authorized for detection and/or diagnosis of SARS-CoV-2 by FDA under an Emergency Use Authorization (EUA).  This EUA will remain in effect (meaning this test can be used) for the duration of the COVID-19 declaration under Section 564(b)(1) of the Act, 21 U.S.C. section 360bbb-3(b)(1), unless the authorization is terminated or revoked sooner. Performed at [facility], [address], [place], [place] [address]   Surgical PCR screen     Status: None   Collection Time: 02/22/19  7:36 AM  Result Value Ref Range Status   MRSA, PCR NEGATIVE NEGATIVE Final   Staphylococcus aureus NEGATIVE NEGATIVE Final    Comment: (NOTE) The Xpert SA Assay (FDA approved for NASAL specimens in patients 81 years of age  and older), is one component of a comprehensive surveillance program. It is not intended to diagnose infection nor to guide or monitor treatment. Performed at [facility], [address], [place], [place] [address]   Culture, respiratory (non-expectorated)     Status: None (Preliminary result)   Collection Time: 02/25/19 12:15 PM  Result Value Ref Range Status   Specimen Description TRACHEAL ASPIRATE  Final   Special Requests NONE  Final   Gram Stain   Final    NO WBC SEEN NO ORGANISMS SEEN Performed at [facility], [address] [place], [place] [address]    Culture PENDING  Incomplete   Report Status PENDING  Incomplete  Culture, blood (Routine X 2) w Reflex to ID Panel     Status: None (Preliminary result)   Collection Time: 02/25/19  2:36 PM  Result Value Ref Range Status   Specimen Description BLOOD RIGHT WRIST  Final   Special Requests   Final    AEROBIC BOTTLE ONLY Blood Culture results may not be optimal due to an  inadequate volume of blood received in culture bottles   Culture   Final    NO GROWTH < 24 HOURS Performed at [facility], [address], [place], [place] [address]    Report Status PENDING  Incomplete  Culture, blood (Routine X 2) w Reflex to ID Panel     Status: None (Preliminary result)   Collection Time: 02/25/19  4:40 PM  Result Value Ref Range Status   Specimen Description BLOOD RIGHT HAND  Final   Special Requests AEROBIC BOTTLE ONLY Blood Culture adequate volume  Final   Culture   Final    NO GROWTH < 24 HOURS Performed at [facility], [address], [place], [place] [address]    Report Status PENDING  Incomplete    Anti-infectives:  Anti-infectives (From admission, onward)   Start     Dose/Rate Route Frequency Ordered Stop   02/25/19 1800  vancomycin (VANCOCIN) 2,000 mg in sodium chloride 0.9 % 500 mL IVPB     2,000 mg 250 mL/hr over 120 Minutes Intravenous Every 12 hours 02/25/19 1723     02/25/19 1800   piperacillin-tazobactam (ZOSYN) IVPB 3.375 g     3.375 g 12.5 mL/hr over 240 Minutes Intravenous Every 8 hours 02/25/19 1723        Best Practice/Protocols:  VTE Prophylaxis: Lovenox (prophylaxtic dose) Continous Sedation  Consults:     Studies:    Events:  Subjective:    Overnight Issues:   Objective:  Vital signs for last 24 hours: Temp:  [99.1 F (37.3 C)-102.5 F (39.2 C)] 99.8 F (37.7 C) (04/26 0800) Pulse Rate:  [82-131] 94 (04/26 0800) Resp:  [15-49] 16 (04/26 0800) BP: (92-178)/(45-137) 147/80 (04/26 0800) SpO2:  [95 %-100 %] 100 % (04/26 0800) FiO2 (%):  [40 %-100 %] 50 % (04/26 0454)  Hemodynamic parameters for last 24 hours:    Intake/Output from previous day: 04/25 0701 - 04/26 0700 In: 4836.3 [I.V.:2965.9; NG/GT:600; IV Piggyback:1270.4] Out: 900 [Urine:900]  Intake/Output this shift: Total I/O In: 291.6 [I.V.:71; IV Piggyback:220.7] Out: -   Vent settings for last 24 hours: Vent Mode: PRVC FiO2 (%):  [40 %-100 %] 50 % Set Rate:  [16 bmp] 16 bmp Vt Set:  [620 mL] 620 mL PEEP:  [5 cmH20] 5 cmH20 Plateau Pressure:  [15 cmH20-22 cmH20] 22 cmH20  Physical Exam:  General: agitated Neuro: F/C HEENT/Neck: ETT and collar Resp: rhonchi CVS: RRR GI: soft, incision CDI, +BS Extremities: edema 1+  Results for orders placed or performed during the hospital encounter of 02/18/19 (from the past 24 hour(s))  Glucose, capillary     Status: Abnormal   Collection Time: 02/25/19  8:36 AM  Result Value Ref Range   Glucose-Capillary 119 (H) 70 - 99 mg/dL   Comment 1 Notify RN    Comment 2 Document in Chart   Culture, respiratory (non-expectorated)     Status: None (Preliminary result)   Collection Time: 02/25/19 12:15 PM  Result Value Ref Range   Specimen Description TRACHEAL ASPIRATE    Special Requests NONE    Gram Stain      NO WBC SEEN NO ORGANISMS SEEN Performed at [facility], [address], [place], [place] [address]    Culture  PENDING    Report Status PENDING   Glucose, capillary     Status: Abnormal   Collection Time: 02/25/19 12:17 PM  Result Value Ref Range   Glucose-Capillary 106 (H) 70 - 99 mg/dL  Comment 1 Notify RN    Comment 2 Document in Chart   I-STAT 7, (LYTES, BLD GAS, ICA, H+H)     Status: Abnormal   Collection Time: 02/25/19 12:41 PM  Result Value Ref Range   pH, Arterial 7.468 (H) 7.350 - 7.450   pCO2 arterial 34.7 32.0 - 48.0 mmHg   pO2, Arterial 300.0 (H) 83.0 - 108.0 mmHg   Bicarbonate 24.7 20.0 - 28.0 mmol/L   TCO2 26 22 - 32 mmol/L   O2 Saturation 100.0 %   Acid-Base Excess 2.0 0.0 - 2.0 mmol/L   Sodium 139 135 - 145 mmol/L   Potassium 3.8 3.5 - 5.1 mmol/L   Calcium, Ion 1.13 (L) 1.15 - 1.40 mmol/L   HCT 27.0 (L) 39.0 - 52.0 %   Hemoglobin 9.2 (L) 13.0 - 17.0 g/dL   Patient temperature 409.8 F    Collection site RADIAL, ALLEN'S TEST ACCEPTABLE    Drawn by RT    Sample type ARTERIAL   Triglycerides     Status: Abnormal   Collection Time: 02/25/19  2:35 PM  Result Value Ref Range   Triglycerides 213 (H) <150 mg/dL  Culture, blood (Routine X 2) w Reflex to ID Panel     Status: None (Preliminary result)   Collection Time: 02/25/19  2:36 PM  Result Value Ref Range   Specimen Description BLOOD RIGHT WRIST    Special Requests      AEROBIC BOTTLE ONLY Blood Culture results may not be optimal due to an inadequate volume of blood received in culture bottles   Culture      NO GROWTH < 24 HOURS Performed at [facility], [address], [place], [place] [address]    Report Status PENDING   Glucose, capillary     Status: None   Collection Time: 02/25/19  3:49 PM  Result Value Ref Range   Glucose-Capillary 90 70 - 99 mg/dL   Comment 1 Notify RN    Comment 2 Document in Chart   Culture, blood (Routine X 2) w Reflex to ID Panel     Status: None (Preliminary result)   Collection Time: 02/25/19  4:40 PM  Result Value Ref Range   Specimen Description BLOOD RIGHT HAND    Special  Requests AEROBIC BOTTLE ONLY Blood Culture adequate volume    Culture      NO GROWTH < 24 HOURS Performed at [facility], [address] [place], [place] [address]    Report Status PENDING   Phosphorus     Status: None   Collection Time: 02/25/19  5:16 PM  Result Value Ref Range   Phosphorus 3.4 2.5 - 4.6 mg/dL  Glucose, capillary     Status: Abnormal   Collection Time: 02/25/19  7:26 PM  Result Value Ref Range   Glucose-Capillary 118 (H) 70 - 99 mg/dL  Glucose, capillary     Status: Abnormal   Collection Time: 02/25/19 11:05 PM  Result Value Ref Range   Glucose-Capillary 101 (H) 70 - 99 mg/dL  Glucose, capillary     Status: None   Collection Time: 02/26/19  3:29 AM  Result Value Ref Range   Glucose-Capillary 94 70 - 99 mg/dL  CBC     Status: Abnormal   Collection Time: 02/26/19  3:46 AM  Result Value Ref Range   WBC 9.7 4.0 - 10.5 K/uL   RBC 3.26 (L) 4.22 - 5.81 MIL/uL   Hemoglobin 10.3 (L) 13.0 - 17.0 g/dL  HCT 31.9 (L) 39.0 - 52.0 %   MCV 97.9 80.0 - 100.0 fL   MCH 31.6 26.0 - 34.0 pg   MCHC 32.3 30.0 - 36.0 g/dL   RDW 14.7 82.9 - 56.2 %   Platelets 260 150 - 400 K/uL   nRBC 0.0 0.0 - 0.2 %  Basic metabolic panel     Status: Abnormal   Collection Time: 02/26/19  3:46 AM  Result Value Ref Range   Sodium 140 135 - 145 mmol/L   Potassium 3.3 (L) 3.5 - 5.1 mmol/L   Chloride 109 98 - 111 mmol/L   CO2 21 (L) 22 - 32 mmol/L   Glucose, Bld 115 (H) 70 - 99 mg/dL   BUN 9 6 - 20 mg/dL   Creatinine, Ser 1.30 0.61 - 1.24 mg/dL   Calcium 7.7 (L) 8.9 - 10.3 mg/dL   GFR calc non Af Amer >60 >60 mL/min   GFR calc Af Amer >60 >60 mL/min   Anion gap 10 5 - 15  Magnesium     Status: None   Collection Time: 02/26/19  3:46 AM  Result Value Ref Range   Magnesium 2.0 1.7 - 2.4 mg/dL  Glucose, capillary     Status: None   Collection Time: 02/26/19  8:03 AM  Result Value Ref Range   Glucose-Capillary 93 70 - 99 mg/dL   Comment 1 Notify RN    Comment 2 Document in Chart      Assessment & Plan: Present on Admission: . Liver laceration    LOS: 8 days   Additional comments:I reviewed the patient's new clinical lab test results. Marland Kitchen PHBC  Grade 4 liver laceration - S/P exploratory laparotomy, hepatorraphy, packing of liver 4/19 by Dr. Luisa Hart, S/P ex lap, removal of packs and partial closure 4/20 by Dr. Janee Morn. S/P ex lap and closure 4/22 by Dr. Janee Morn. L rib FX 2-3.  Pain control.   Acute hypoxic ventilator dependent respiratory failure - reintubated yesterday for worsening resp failure due to PNA, full support today, add BDs. May have ETT cuff leak - RT evaluating. ETOH abuse disorder/acute alcohol withdrawal - Precedex, Ativan, CSW eval later   ID - Vanc/Zosyn empiric for PNA, resp and blood CX P C spine - plan to examine once more alert Neuro - resume Precedex as above ABL anemia - stable.   FEN -hypokalemia - daily KCl.  Tube feeds via Cortrak.   VTE - PAS, lovenox Dispo - ICU Critical Care Total Time*: 45 Minutes  Violeta Gelinas, MD, MPH, FACS Trauma: [phone] General Surgery: [phone]  02/26/2019  *Care during the described time interval was provided by me. I have reviewed this patient's available data, including medical history, events of note, physical examination and test results as part of my evaluation.

## 2019-02-26 NOTE — Progress Notes (Signed)
Nutrition Follow-up  DOCUMENTATION CODES:   Not applicable  INTERVENTION:   Tube Feeding:  Pivot 1.5 at 65 ml/hr  Provides 2340 kcals, 146 g of protein and 1184 mL of free water  TF regimen and propofol at current rate providing 2873 total kcal/day (107 % of kcal needs)  NUTRITION DIAGNOSIS:   Increased nutrient needs related to (trauma) as evidenced by estimated needs.  Being addressed via TF   GOAL:   Patient will meet greater than or equal to 90% of their needs  Met  MONITOR:   I & O's, Vent status, TF tolerance  REASON FOR ASSESSMENT:   Consult Enteral/tube feeding initiation and management  ASSESSMENT:   Pt with no know PMH admitted as a PHBC with grade 4 liver laceration s/p ex lap, hepatorraphy, packing of liver 4/19, s/p ex lap, removal of packs and partial closure 4/20, with abd closure 4/22, L rib fxs 2-3.    4/22 TF started via OG tube 4/23 Extubated 4/24 Cortrak placed (gastric) 4/25 Re-Intubated  Patient is currently intubated on ventilator support MV: 11.8 L/min Temp (24hrs), Avg:100.4 F (38 C), Min:99.1 F (37.3 C), Max:102.5 F (39.2 C)  Propofol: 20.2 ml/hr (533 kcals)  Pivot 1.5 at 25 ml/hr, current goal rate of 65 ml/hr. Despite titration orders, TF has not been titrated to goal rate  Current wt 104.4 kg; admission wt 112.4 kg  Labs: potassium 3.3 (L) Meds: D5-NS at 125 ml/hr    Diet Order:   Diet Order            Diet NPO time specified  Diet effective now              EDUCATION NEEDS:   No education needs have been identified at this time  Skin:  Skin Assessment: Reviewed RN Assessment(abd incision)  Last BM:  no BM since admission  Height:   Ht Readings from Last 1 Encounters:  02/18/19 6' (1.829 m)    Weight:   Wt Readings from Last 1 Encounters:  02/25/19 104.4 kg    Ideal Body Weight:  80.9 kg  BMI:  Body mass index is 31.22 kg/m.  Estimated Nutritional Needs:   Kcal:  2695 kcals  Protein:   140-160 grams  Fluid:  > 2 L/day  Kerman Passey MS, RD, LDN, CNSC [phone] Pager  [phone] Weekend/On-Call Pager

## 2019-02-26 NOTE — Plan of Care (Signed)
Progressing on all goals at this time.  

## 2019-02-27 ENCOUNTER — Inpatient Hospital Stay (HOSPITAL_COMMUNITY): Payer: Medicaid Other | Admitting: Certified Registered"

## 2019-02-27 ENCOUNTER — Inpatient Hospital Stay (HOSPITAL_COMMUNITY): Payer: Medicaid Other

## 2019-02-27 LAB — GLUCOSE, CAPILLARY
Glucose-Capillary: 111 mg/dL — ABNORMAL HIGH (ref 70–99)
Glucose-Capillary: 102 mg/dL — ABNORMAL HIGH (ref 70–99)
Glucose-Capillary: 133 mg/dL — ABNORMAL HIGH (ref 70–99)
Glucose-Capillary: 151 mg/dL — ABNORMAL HIGH (ref 70–99)
Glucose-Capillary: 149 mg/dL — ABNORMAL HIGH (ref 70–99)
Glucose-Capillary: 99 mg/dL (ref 70–99)

## 2019-02-27 LAB — BASIC METABOLIC PANEL
Anion gap: 6 (ref 5–15)
BUN: 6 mg/dL (ref 6–20)
CO2: 24 mmol/L (ref 22–32)
Calcium: 7.9 mg/dL — ABNORMAL LOW (ref 8.9–10.3)
Chloride: 113 mmol/L — ABNORMAL HIGH (ref 98–111)
Creatinine, Ser: 0.5 mg/dL — ABNORMAL LOW (ref 0.61–1.24)
GFR calc Af Amer: >60 mL/min (ref >60)
GFR calc non Af Amer: >60 mL/min (ref >60)
Glucose, Bld: 148 mg/dL — ABNORMAL HIGH (ref 70–99)
Potassium: 3.7 mmol/L (ref 3.5–5.1)
Sodium: 143 mmol/L (ref 135–145)

## 2019-02-27 LAB — CBC
HCT: 30.5 % — ABNORMAL LOW (ref 39.0–52.0)
Hemoglobin: 9.9 g/dL — ABNORMAL LOW (ref 13.0–17.0)
MCH: 32 pg (ref 26.0–34.0)
MCHC: 32.5 g/dL (ref 30.0–36.0)
MCV: 98.7 fL (ref 80.0–100.0)
Platelets: 292 10*3/uL (ref 150–400)
RBC: 3.09 MIL/uL — ABNORMAL LOW (ref 4.22–5.81)
RDW: 13.4 % (ref 11.5–15.5)
WBC: 10 10*3/uL (ref 4.0–10.5)
nRBC: 0 % (ref 0.0–0.2)

## 2019-02-27 MED ORDER — FUROSEMIDE 8 MG/ML PO SOLN
40.0000 mg | Freq: Every day | ORAL | Status: DC
  Filled 2019-02-27 (×2): qty 5

## 2019-02-27 MED ORDER — FUROSEMIDE 10 MG/ML PO SOLN
40.0000 mg | Freq: Every day | ORAL | Status: DC
  Administered 2019-02-27 – 2019-03-08 (×10): 40 mg
  Filled 2019-02-27 (×11): qty 4

## 2019-02-27 MED ORDER — SUCCINYLCHOLINE CHLORIDE 20 MG/ML IJ SOLN
INTRAMUSCULAR | Status: DC | PRN
  Administered 2019-02-27: 120 mg via INTRAVENOUS

## 2019-02-27 MED ORDER — CLONAZEPAM 1 MG PO TABS
1.0000 mg | ORAL_TABLET | Freq: Three times a day (TID) | ORAL | Status: DC
  Administered 2019-02-27 (×3): 1 mg
  Filled 2019-02-27 (×3): qty 1

## 2019-02-27 MED ORDER — QUETIAPINE FUMARATE 200 MG PO TABS
200.0000 mg | ORAL_TABLET | Freq: Two times a day (BID) | ORAL | Status: DC
  Administered 2019-02-27 (×2): 200 mg
  Filled 2019-02-27 (×2): qty 1

## 2019-02-27 MED ORDER — DOCUSATE SODIUM 50 MG/5ML PO LIQD
100.0000 mg | Freq: Two times a day (BID) | ORAL | Status: DC
  Administered 2019-02-27 – 2019-03-05 (×5): 100 mg via ORAL
  Filled 2019-02-27 (×5): qty 10

## 2019-02-27 MED ORDER — SODIUM CHLORIDE 0.9 % IV SOLN
3.0000 g | Freq: Four times a day (QID) | INTRAVENOUS | Status: AC
  Administered 2019-02-27 – 2019-03-05 (×27): 3 g via INTRAVENOUS
  Filled 2019-02-27 (×27): qty 3

## 2019-02-27 NOTE — Progress Notes (Signed)
Patient ID: Jeremy [name], male   DOB: 1996/10/22, 23 y.o.   MRN: [id number] Follow up - Trauma Critical Care  Patient Details:    Jeremy [name] is an 23 y.o. male.  Lines/tubes : Airway 8 mm (Active)  Secured at (cm) 27 cm 02/26/2019  4:54 AM  Measured From Lips 02/26/2019  4:54 AM  Secured Location Left 02/26/2019  4:54 AM  Secured By [employer] Tube Holder 02/26/2019  4:54 AM  Tube Holder Repositioned Yes 02/26/2019  4:54 AM  Cuff Pressure (cm H2O) 28 cm H2O 02/25/2019  8:28 PM  Site Condition Dry 02/26/2019  4:54 AM     PICC Triple Lumen 02/22/19 PICC Left Brachial 48 cm 0 cm (Active)  Indication for Insertion or Continuance of Line Prolonged intravenous therapies;Limited venous access - need for IV therapy >5 days (PICC only) 02/26/2019  8:00 AM  Exposed Catheter (cm) 0 cm 02/22/2019  2:31 PM  Site Assessment Clean;Dry;Intact 02/26/2019  8:00 AM  Lumen #1 Status Infusing 02/26/2019  8:00 AM  Lumen #2 Status Infusing 02/26/2019  8:00 AM  Lumen #3 Status In-line blood sampling system in place 02/26/2019  8:00 AM  Dressing Type Transparent;Occlusive 02/26/2019  8:00 AM  Dressing Status Clean;Dry;Intact;Antimicrobial disc in place 02/26/2019  8:00 AM  Line Care Connections checked and tightened;Zeroed and calibrated;Leveled 02/26/2019  8:00 AM  Dressing Intervention Dressing reinforced 02/24/2019  8:00 AM  Dressing Change Due 03/01/19 02/26/2019  8:00 AM     External Urinary Catheter (Active)  Collection Container Standard drainage bag 02/26/2019  4:00 AM  Securement Method Leg strap 02/26/2019  4:00 AM  Intervention Equipment Changed 02/26/2019  4:00 AM  Output (mL) 550 mL 02/26/2019  4:00 AM    Microbiology/Sepsis markers: Results for orders placed or performed during the hospital encounter of 02/18/19  MRSA PCR Screening     Status: None   Collection Time: 02/19/19  2:02 AM  Result Value Ref Range Status   MRSA by PCR NEGATIVE NEGATIVE Final    Comment:        The GeneXpert MRSA Assay  (FDA approved for NASAL specimens only), is one component of a comprehensive MRSA colonization surveillance program. It is not intended to diagnose MRSA infection nor to guide or monitor treatment for MRSA infections. Performed at [facility], [address] [place], [place] [id number]   SARS Coronavirus 2 [facility](Hospital order, Performed in [facility])     Status: None   Collection Time: 02/19/19  3:20 AM  Result Value Ref Range Status   SARS Coronavirus 2 NEGATIVE NEGATIVE Final    Comment: (NOTE) If result is NEGATIVE SARS-CoV-2 target nucleic acids are NOT DETECTED. The SARS-CoV-2 RNA is generally detectable in upper and lower  respiratory specimens during the acute phase of infection. The lowest  concentration of SARS-CoV-2 viral copies this assay can detect is 250  copies / mL. A negative result does not preclude SARS-CoV-2 infection  and should not be used as the sole basis for treatment or other  patient management decisions.  A negative result may occur with  improper specimen collection / handling, submission of specimen other  than nasopharyngeal swab, presence of viral mutation(s) within the  areas targeted by this assay, and inadequate number of viral copies  (<250 copies / mL). A negative result must be combined with clinical  observations, patient history, and epidemiological information. If result is POSITIVE SARS-CoV-2 target nucleic acids are DETECTED. The SARS-CoV-2 RNA is generally detectable in upper  and lower  respiratory specimens dur ing the acute phase of infection.  Positive  results are indicative of active infection with SARS-CoV-2.  Clinical  correlation with patient history and other diagnostic information is  necessary to determine patient infection status.  Positive results do  not rule out bacterial infection or co-infection with other viruses. If result is PRESUMPTIVE POSTIVE SARS-CoV-2 nucleic acids MAY BE PRESENT.   A  presumptive positive result was obtained on the submitted specimen  and confirmed on repeat testing.  While 2019 novel coronavirus  (SARS-CoV-2) nucleic acids may be present in the submitted sample  additional confirmatory testing may be necessary for epidemiological  and / or clinical management purposes  to differentiate between  SARS-CoV-2 and other Sarbecovirus currently known to infect humans.  If clinically indicated additional testing with an alternate test  methodology [phone]) is advised. The SARS-CoV-2 RNA is generally  detectable in upper and lower respiratory sp ecimens during the acute  phase of infection. The expected result is Negative. Fact Sheet for Patients:  BoilerBrush.com.cy Fact Sheet for Healthcare Providers: https://pope.com/ This test is not yet approved or cleared by the [place] FDA and has been authorized for detection and/or diagnosis of SARS-CoV-2 by FDA under an Emergency Use Authorization (EUA).  This EUA will remain in effect (meaning this test can be used) for the duration of the COVID-19 declaration under Section 564(b)(1) of the Act, 21 U.S.C. section 360bbb-3(b)(1), unless the authorization is terminated or revoked sooner. Performed at [facility], [address] [place], [place] [address]   Respiratory Panel by PCR     Status: None   Collection Time: 02/19/19  3:20 AM  Result Value Ref Range Status   Adenovirus NOT DETECTED NOT DETECTED Final   Coronavirus 229E NOT DETECTED NOT DETECTED Final    Comment: (NOTE) The Coronavirus on the Respiratory Panel, DOES NOT test for the novel  Coronavirus (2019 nCoV)    Coronavirus HKU1 NOT DETECTED NOT DETECTED Final   Coronavirus NL63 NOT DETECTED NOT DETECTED Final   Coronavirus OC43 NOT DETECTED NOT DETECTED Final   Metapneumovirus NOT DETECTED NOT DETECTED Final   Rhinovirus / Enterovirus NOT DETECTED NOT DETECTED Final   Influenza A NOT  DETECTED NOT DETECTED Final   Influenza B NOT DETECTED NOT DETECTED Final   Parainfluenza Virus 1 NOT DETECTED NOT DETECTED Final   Parainfluenza Virus 2 NOT DETECTED NOT DETECTED Final   Parainfluenza Virus 3 NOT DETECTED NOT DETECTED Final   Parainfluenza Virus 4 NOT DETECTED NOT DETECTED Final   Respiratory Syncytial Virus NOT DETECTED NOT DETECTED Final   Bordetella pertussis NOT DETECTED NOT DETECTED Final   Chlamydophila pneumoniae NOT DETECTED NOT DETECTED Final   Mycoplasma pneumoniae NOT DETECTED NOT DETECTED Final    Comment: Performed at [facility], [address] [place], [place] [address]  SARS Coronavirus 2 [facility] order, Performed in [facility] hospital lab)     Status: None   Collection Time: 02/19/19  6:33 PM  Result Value Ref Range Status   SARS Coronavirus 2 NEGATIVE NEGATIVE Final    Comment: (NOTE) If result is NEGATIVE SARS-CoV-2 target nucleic acids are NOT DETECTED. The SARS-CoV-2 RNA is generally detectable in upper and lower  respiratory specimens during the acute phase of infection. The lowest  concentration of SARS-CoV-2 viral copies this assay can detect is 250  copies / mL. A negative result does not preclude SARS-CoV-2 infection  and should not be used as the  sole basis for treatment or other  patient management decisions.  A negative result may occur with  improper specimen collection / handling, submission of specimen other  than nasopharyngeal swab, presence of viral mutation(s) within the  areas targeted by this assay, and inadequate number of viral copies  (<250 copies / mL). A negative result must be combined with clinical  observations, patient history, and epidemiological information. If result is POSITIVE SARS-CoV-2 target nucleic acids are DETECTED. The SARS-CoV-2 RNA is generally detectable in upper and lower  respiratory specimens dur ing the acute phase of infection.  Positive  results are indicative of active infection with  SARS-CoV-2.  Clinical  correlation with patient history and other diagnostic information is  necessary to determine patient infection status.  Positive results do  not rule out bacterial infection or co-infection with other viruses. If result is PRESUMPTIVE POSTIVE SARS-CoV-2 nucleic acids MAY BE PRESENT.   A presumptive positive result was obtained on the submitted specimen  and confirmed on repeat testing.  While 2019 novel coronavirus  (SARS-CoV-2) nucleic acids may be present in the submitted sample  additional confirmatory testing may be necessary for epidemiological  and / or clinical management purposes  to differentiate between  SARS-CoV-2 and other Sarbecovirus currently known to infect humans.  If clinically indicated additional testing with an alternate test  methodology [phone]) is advised. The SARS-CoV-2 RNA is generally  detectable in upper and lower respiratory sp ecimens during the acute  phase of infection. The expected result is Negative. Fact Sheet for Patients:  BoilerBrush.com.cy Fact Sheet for Healthcare Providers: https://pope.com/ This test is not yet approved or cleared by the [place] FDA and has been authorized for detection and/or diagnosis of SARS-CoV-2 by FDA under an Emergency Use Authorization (EUA).  This EUA will remain in effect (meaning this test can be used) for the duration of the COVID-19 declaration under Section 564(b)(1) of the Act, 21 U.S.C. section 360bbb-3(b)(1), unless the authorization is terminated or revoked sooner. Performed at [facility], [address], [place], [place] [address]   Surgical PCR screen     Status: None   Collection Time: 02/22/19  7:36 AM  Result Value Ref Range Status   MRSA, PCR NEGATIVE NEGATIVE Final   Staphylococcus aureus NEGATIVE NEGATIVE Final    Comment: (NOTE) The Xpert SA Assay (FDA approved for NASAL specimens in patients 71 years of age  and older), is one component of a comprehensive surveillance program. It is not intended to diagnose infection nor to guide or monitor treatment. Performed at [facility], [address] [place], [place] [address]   Culture, respiratory (non-expectorated)     Status: None   Collection Time: 02/25/19 12:15 PM  Result Value Ref Range Status   Specimen Description TRACHEAL ASPIRATE  Final   Special Requests NONE  Final   Gram Stain NO WBC SEEN NO ORGANISMS SEEN   Final   Culture   Final    FEW HAEMOPHILUS INFLUENZAE BETA LACTAMASE NEGATIVE Performed at [facility], [address], [place], [place] [address]    Report Status 02/26/2019 FINAL  Final  Culture, blood (Routine X 2) w Reflex to ID Panel     Status: None (Preliminary result)   Collection Time: 02/25/19  2:36 PM  Result Value Ref Range Status   Specimen Description BLOOD RIGHT WRIST  Final   Special Requests   Final    AEROBIC BOTTLE ONLY Blood Culture results may not  be optimal due to an inadequate volume of blood received in culture bottles   Culture   Final    NO GROWTH 2 DAYS Performed at [facility], [address] [place], [place] [address]    Report Status PENDING  Incomplete  Culture, blood (Routine X 2) w Reflex to ID Panel     Status: None (Preliminary result)   Collection Time: 02/25/19  4:40 PM  Result Value Ref Range Status   Specimen Description BLOOD RIGHT HAND  Final   Special Requests AEROBIC BOTTLE ONLY Blood Culture adequate volume  Final   Culture   Final    NO GROWTH 2 DAYS Performed at [facility], [address] [place], [place] [address]    Report Status PENDING  Incomplete    Anti-infectives:  Anti-infectives (From admission, onward)   Start     Dose/Rate Route Frequency Ordered Stop   02/25/19 1800  vancomycin (VANCOCIN) 2,000 mg in sodium chloride 0.9 % 500 mL IVPB     2,000 mg 250 mL/hr over 120 Minutes Intravenous Every 12 hours 02/25/19 1723      02/25/19 1800  piperacillin-tazobactam (ZOSYN) IVPB 3.375 g     3.375 g 12.5 mL/hr over 240 Minutes Intravenous Every 8 hours 02/25/19 1723        Best Practice/Protocols:  VTE Prophylaxis: Lovenox (prophylaxtic dose) Continous Sedation  Consults:     Studies:    Events:  Subjective:    Overnight Issues:  Remains very agitated at times; ETT has cuff leak Objective:  Vital signs for last 24 hours: Temp:  [99.2 F (37.3 C)-100.2 F (37.9 C)] (P) 99.5 F (37.5 C) (04/27 0800) Pulse Rate:  [64-113] 83 (04/27 0810) Resp:  [14-35] 20 (04/27 0810) BP: (93-167)/(40-120) 156/85 (04/27 0810) SpO2:  [98 %-100 %] 99 % (04/27 0810) FiO2 (%):  [40 %-50 %] 40 % (04/27 0832) Weight:  [103.8 kg] 103.8 kg (04/27 0500)  Hemodynamic parameters for last 24 hours:    Intake/Output from previous day: 04/26 0701 - 04/27 0700 In: 6597.4 [I.V.:4436.2; NG/GT:1139.3; IV Piggyback:1021.9] Out: 2325 [Urine:2325]  Intake/Output this shift: Total I/O In: 621 [I.V.:289; IV Piggyback:332] Out: -   Vent settings for last 24 hours: Vent Mode: PRVC FiO2 (%):  [40 %-50 %] 40 % Set Rate:  [16 bmp] 16 bmp Vt Set:  [620 mL] 620 mL PEEP:  [5 cmH20] 5 cmH20 Pressure Support:  [10 cmH20] 10 cmH20 Plateau Pressure:  [13 cmH20-20 cmH20] 13 cmH20  Physical Exam:  General: agitated Neuro: no F/C HEENT/Neck: ETT and collar Resp: rhonchi CVS: RRR GI: soft, incision some erythema, +BS; PA open lower midline with some purulent drainage Extremities: edema 1+  Results for orders placed or performed during the hospital encounter of 02/18/19 (from the past 24 hour(s))  Glucose, capillary     Status: Abnormal   Collection Time: 02/26/19 11:59 AM  Result Value Ref Range   Glucose-Capillary 100 (H) 70 - 99 mg/dL   Comment 1 Notify RN    Comment 2 Document in Chart   Glucose, capillary     Status: Abnormal   Collection Time: 02/26/19  3:55 PM  Result Value Ref Range   Glucose-Capillary 115 (H) 70 -  99 mg/dL   Comment 1 Notify RN    Comment 2 Document in Chart   Glucose, capillary     Status: None   Collection Time: 02/26/19  7:39 PM  Result Value Ref Range   Glucose-Capillary 99  70 - 99 mg/dL  Glucose, capillary     Status: Abnormal   Collection Time: 02/26/19 11:17 PM  Result Value Ref Range   Glucose-Capillary 138 (H) 70 - 99 mg/dL  Glucose, capillary     Status: Abnormal   Collection Time: 02/27/19  3:24 AM  Result Value Ref Range   Glucose-Capillary 111 (H) 70 - 99 mg/dL  CBC     Status: Abnormal   Collection Time: 02/27/19  4:42 AM  Result Value Ref Range   WBC 10.0 4.0 - 10.5 K/uL   RBC 3.09 (L) 4.22 - 5.81 MIL/uL   Hemoglobin 9.9 (L) 13.0 - 17.0 g/dL   HCT 60.430.5 (L) 54.039.0 - 98.152.0 %   MCV 98.7 80.0 - 100.0 fL   MCH 32.0 26.0 - 34.0 pg   MCHC 32.5 30.0 - 36.0 g/dL   RDW 19.113.4 47.811.5 - 29.515.5 %   Platelets 292 150 - 400 K/uL   nRBC 0.0 0.0 - 0.2 %  Basic metabolic panel     Status: Abnormal   Collection Time: 02/27/19  4:42 AM  Result Value Ref Range   Sodium 143 135 - 145 mmol/L   Potassium 3.7 3.5 - 5.1 mmol/L   Chloride 113 (H) 98 - 111 mmol/L   CO2 24 22 - 32 mmol/L   Glucose, Bld 148 (H) 70 - 99 mg/dL   BUN 6 6 - 20 mg/dL   Creatinine, Ser 6.210.50 (L) 0.61 - 1.24 mg/dL   Calcium 7.9 (L) 8.9 - 10.3 mg/dL   GFR calc non Af Amer >60 >60 mL/min   GFR calc Af Amer >60 >60 mL/min   Anion gap 6 5 - 15  Glucose, capillary     Status: Abnormal   Collection Time: 02/27/19  7:57 AM  Result Value Ref Range   Glucose-Capillary 102 (H) 70 - 99 mg/dL   Comment 1 Notify RN    Comment 2 Document in Chart     Assessment & Plan: Present on Admission: . Liver laceration    LOS: 9 days   Additional comments:I reviewed the patient's new clinical lab test results & radiological results PHBC  Grade 4 liver laceration - S/P exploratory laparotomy, hepatorraphy, packing of liver 4/19 by Dr. Luisa Hartornett, S/P ex lap, removal of packs and partial closure 4/20 by Dr. Janee Mornhompson. S/P ex lap  and closure 4/22 by Dr. Janee Mornhompson. L rib FX 2-3.  Pain control.   Acute hypoxic ventilator dependent respiratory failure - reintubated Sat for worsening resp failure due to PNA, full support today, add BDs. has ETT cuff leak -will exchange ETT. Has worsening airspace diz on Rt - will start diuresing some ETOH abuse disorder/acute alcohol withdrawal - Precedex, Ativan, CSW eval later ; increase seroquel & ativan ID - Vanc/Zosyn empiric for PNA, resp and blood CX P C spine - plan to examine once more alert Neuro -cont Precedex as above ABL anemia - stable.   FEN -hypokalemia - daily KCl.  Tube feeds via Cortrak.  Decrease mIVF (currently at 125cc/hr) VTE - PAS, lovenox Dispo - ICU, exchange ETT, decrease IVFs, start lasix, start bowel regimen Critical Care Total Time*: 32 Minutes  Mary SellaEric M. Andrey CampanileWilson, MD, FACS General, Bariatric, & Minimally Invasive Surgery [facility], [place]   02/27/2019  *Care during the described time interval was provided by me. I have reviewed this patient's available data, including medical history, events of note, physical examination and test results as part of my evaluation.

## 2019-02-27 NOTE — Anesthesia Procedure Notes (Addendum)
Procedure Name: Intubation Date/Time: 02/27/2019 9:46 AM Performed by: Kipp Brood, MD Pre-anesthesia Checklist: Patient identified, Emergency Drugs available, Suction available and Patient being monitored Patient Re-evaluated:Patient Re-evaluated prior to induction Oxygen Delivery Method: Ambu bag Preoxygenation: Pre-oxygenation with 100% oxygen Induction Type: IV induction Laryngoscope Size: Glidescope and 4 Grade View: Grade I Tube size: 8.0 mm Number of attempts: 1 Airway Equipment and Method: Video-laryngoscopy and Bougie stylet Placement Confirmation: ETT inserted through vocal cords under direct vision,  positive ETCO2 and breath sounds checked- equal and bilateral Secured at: 23 cm Tube secured with: Tape Dental Injury: Teeth and Oropharynx as per pre-operative assessment  Comments: Tube exchange in ICU.

## 2019-02-27 NOTE — Progress Notes (Signed)
Pt's 02 sats 78-80% on ventilator, attempted to suction, sats remained low. Gave 02 breaths and began bagging pt. Respiratory successfully suctioned plug from pt's endotrach tube. Sats back to 96-100%

## 2019-02-27 NOTE — Progress Notes (Signed)
I&O catheterize x1--bladder scan read , output 900 mL

## 2019-02-27 NOTE — Plan of Care (Signed)
  Problem: Nutrition: Goal: Adequate nutrition will be maintained Outcome: Progressing   Problem: Coping: Goal: Level of anxiety will decrease Outcome: Progressing   Problem: Pain Managment: Goal: General experience of comfort will improve Outcome: Progressing   Problem: Safety: Goal: Ability to remain free from injury will improve Outcome: Progressing   Problem: Skin Integrity: Goal: Demonstration of wound healing without infection will improve Outcome: Progressing   Problem: Activity: Goal: Risk for activity intolerance will decrease Outcome: Not Progressing

## 2019-02-27 NOTE — Progress Notes (Signed)
Nutrition Follow-up RD working remotely.  DOCUMENTATION CODES:   Not applicable  INTERVENTION:   Tube Feeding:  Pivot 1.5 at 65 ml/hr  Provides 2340 kcals, 146 g of protein and 1184 mL of free water  TF regimen and propofol at current rate providing 2588 total kcal/day (96 % of kcal needs)  NUTRITION DIAGNOSIS:   Increased nutrient needs related to (trauma) as evidenced by estimated needs.  Being addressed via TF   GOAL:   Patient will meet greater than or equal to 90% of their needs  Met  MONITOR:   I & O's, Vent status, TF tolerance  REASON FOR ASSESSMENT:   Consult Enteral/tube feeding initiation and management  ASSESSMENT:   Pt with no know PMH admitted as a PHBC with grade 4 liver laceration s/p ex lap, hepatorraphy, packing of liver 4/19, s/p ex lap, removal of packs and partial closure 4/20, with abd closure 4/22, L rib fxs 2-3.    4/22 TF started via OG tube 4/23 Extubated 4/24 Cortrak placed (gastric) 4/25 Re-Intubated  Patient is currently intubated on ventilator support MV: 10.8 L/min Temp (24hrs), Avg:99.6 F (37.6 C), Min:99.2 F (37.3 C), Max:100.2 F (37.9 C)  Propofol: 9.4 ml/hr (248 kcals)  Current wt 104.4 kg; admission wt 112.4 kg  Labs: reviewed Meds: miralax, D5-NS at 125 ml/hr    Diet Order:   Diet Order            Diet NPO time specified  Diet effective now              EDUCATION NEEDS:   No education needs have been identified at this time  Skin:  Skin Assessment: Reviewed RN Assessment(abd incision)  Last BM:  no BM since admission  Height:   Ht Readings from Last 1 Encounters:  02/18/19 6' (1.829 m)    Weight:   Wt Readings from Last 1 Encounters:  02/27/19 103.8 kg    Ideal Body Weight:  80.9 kg  BMI:  Body mass index is 31.04 kg/m.  Estimated Nutritional Needs:   Kcal:  2695 kcals  Protein:  140-160 grams  Fluid:  > 2 L/day  Maylon Peppers RD, LDN, CNSC [phone] Pager [phone] After  Hours Pager

## 2019-02-28 LAB — BASIC METABOLIC PANEL
Anion gap: 8 (ref 5–15)
BUN: 7 mg/dL (ref 6–20)
CO2: 23 mmol/L (ref 22–32)
Calcium: 7.6 mg/dL — ABNORMAL LOW (ref 8.9–10.3)
Chloride: 110 mmol/L (ref 98–111)
Creatinine, Ser: 0.48 mg/dL — ABNORMAL LOW (ref 0.61–1.24)
GFR calc Af Amer: >60 mL/min (ref >60)
GFR calc non Af Amer: >60 mL/min (ref >60)
Glucose, Bld: 159 mg/dL — ABNORMAL HIGH (ref 70–99)
Potassium: 3.7 mmol/L (ref 3.5–5.1)
Sodium: 141 mmol/L (ref 135–145)

## 2019-02-28 LAB — GLUCOSE, CAPILLARY
Glucose-Capillary: 120 mg/dL — ABNORMAL HIGH (ref 70–99)
Glucose-Capillary: 89 mg/dL (ref 70–99)
Glucose-Capillary: 105 mg/dL — ABNORMAL HIGH (ref 70–99)
Glucose-Capillary: 95 mg/dL (ref 70–99)
Glucose-Capillary: 110 mg/dL — ABNORMAL HIGH (ref 70–99)
Glucose-Capillary: 111 mg/dL — ABNORMAL HIGH (ref 70–99)

## 2019-02-28 LAB — TRIGLYCERIDES: Triglycerides: 536 mg/dL — ABNORMAL HIGH (ref <150)

## 2019-02-28 MED ORDER — MIDAZOLAM BOLUS VIA INFUSION
1.0000 mg | INTRAVENOUS | Status: DC | PRN
  Administered 2019-03-01 – 2019-03-04 (×3): 1 mg via INTRAVENOUS
  Administered 2019-03-05: 04:00:00 2 mg via INTRAVENOUS
  Filled 2019-02-28: qty 2

## 2019-02-28 MED ORDER — MIDAZOLAM 50MG/50ML (1MG/ML) PREMIX INFUSION
0.5000 mg/h | INTRAVENOUS | Status: DC
  Administered 2019-02-28: 13:00:00 0.5 mg/h via INTRAVENOUS
  Filled 2019-02-28: qty 50

## 2019-02-28 MED ORDER — ONDANSETRON HCL 4 MG/2ML IJ SOLN
INTRAMUSCULAR | Status: AC
  Administered 2019-02-28: 16:00:00 4 mg
  Filled 2019-02-28: qty 2

## 2019-02-28 MED ORDER — QUETIAPINE FUMARATE 200 MG PO TABS
200.0000 mg | ORAL_TABLET | Freq: Three times a day (TID) | ORAL | Status: DC
  Administered 2019-02-28 – 2019-03-10 (×30): 200 mg
  Filled 2019-02-28 (×30): qty 1

## 2019-02-28 MED ORDER — MIDAZOLAM HCL 2 MG/2ML IJ SOLN: 2.0000 mg | INTRAMUSCULAR | Status: DC | PRN

## 2019-02-28 MED ORDER — CLONAZEPAM 1 MG PO TABS
2.0000 mg | ORAL_TABLET | Freq: Three times a day (TID) | ORAL | Status: DC
  Administered 2019-02-28 – 2019-03-02 (×9): 2 mg
  Filled 2019-02-28 (×9): qty 2

## 2019-02-28 MED ORDER — MIDAZOLAM 50MG/50ML (1MG/ML) PREMIX INFUSION
0.0000 mg/h | INTRAVENOUS | Status: DC
  Administered 2019-03-01 (×2): 5 mg/h via INTRAVENOUS
  Administered 2019-03-01: 3 mg/h via INTRAVENOUS
  Administered 2019-03-01 – 2019-03-03 (×4): 5 mg/h via INTRAVENOUS
  Administered 2019-03-03: 15:00:00 2 mg/h via INTRAVENOUS
  Administered 2019-03-04: 6 mg/h via INTRAVENOUS
  Administered 2019-03-04 (×2): 5 mg/h via INTRAVENOUS
  Administered 2019-03-05: 4 mg/h via INTRAVENOUS
  Administered 2019-03-05: 7 mg/h via INTRAVENOUS
  Administered 2019-03-05: 05:00:00 8 mg/h via INTRAVENOUS
  Administered 2019-03-06: 4 mg/h via INTRAVENOUS
  Filled 2019-02-28 (×15): qty 50

## 2019-02-28 MED ORDER — MIDAZOLAM HCL 2 MG/2ML IJ SOLN
2.0000 mg | INTRAMUSCULAR | Status: DC | PRN
  Administered 2019-02-28: 14:00:00 2 mg via INTRAVENOUS
  Filled 2019-02-28 (×3): qty 2

## 2019-02-28 NOTE — Progress Notes (Addendum)
Patient ID: Jeremy [name], male   DOB: 09/12/1996, 23 y.o.   MRN: [id number] Follow up - Trauma Critical Care  Patient Details:    Jeremy [name] is an 23 y.o. male.  Lines/tubes : Airway 8 mm (Active)  Secured at (cm) 23 cm 02/28/2019  7:32 AM  Measured From Lips 02/28/2019  7:32 AM  Secured Location Center 02/28/2019  7:32 AM  Secured By [employer] T[employer] 02/28/2019  7:32 AM  Tube Holder Repositioned Yes 02/28/2019  7:32 AM  Cuff Pressure (cm H2O) 28 cm H2O 02/27/2019  8:43 PM  Site Condition Dry 02/28/2019  7:32 AM     PICC Triple Lumen 02/22/19 PICC Left Brachial 48 cm 0 cm (Active)  Indication for Insertion or Continuance of Line Prolonged intravenous therapies;Limited venous access - need for IV therapy >5 days (PICC only) 02/27/2019  8:00 PM  Exposed Catheter (cm) 0 cm 02/22/2019  2:31 PM  Site Assessment Clean;Dry;Intact 02/27/2019  8:00 PM  Lumen #1 Status Infusing;Flushed 02/27/2019  8:00 PM  Lumen #2 Status Infusing;Flushed 02/27/2019  8:00 PM  Lumen #3 Status In-line blood sampling system in place 02/27/2019  8:00 PM  Dressing Type Transparent;Occlusive 02/27/2019  8:00 PM  Dressing Status Clean;Dry;Intact;Antimicrobial disc in place 02/27/2019  8:00 PM  Line Care Other (Comment) 02/28/2019  6:00 AM  Dressing Intervention Dressing reinforced 02/24/2019  8:00 AM  Dressing Change Due 03/01/19 02/27/2019  8:00 PM     External Urinary Catheter (Active)  Collection Container Leg bag 02/27/2019  6:58 PM  Intervention Equipment Changed 02/27/2019  6:58 PM  Output (mL) 1000 mL 02/28/2019  5:43 AM    Microbiology/Sepsis markers: Results for orders placed or performed during the hospital encounter of 02/18/19  MRSA PCR Screening     Status: None   Collection Time: 02/19/19  2:02 AM  Result Value Ref Range Status   MRSA by PCR NEGATIVE NEGATIVE Final    Comment:        The GeneXpert MRSA Assay (FDA approved for NASAL specimens only), is one component of a comprehensive MRSA  colonization surveillance program. It is not intended to diagnose MRSA infection nor to guide or monitor treatment for MRSA infections. Performed at [facility], [address] [place], [place] [id number]   SARS Coronavirus 2 [facility](Hospital order, Performed in [facility] hospital lab)     Status: None   Collection Time: 02/19/19  3:20 AM  Result Value Ref Range Status   SARS Coronavirus 2 NEGATIVE NEGATIVE Final    Comment: (NOTE) If result is NEGATIVE SARS-CoV-2 target nucleic acids are NOT DETECTED. The SARS-CoV-2 RNA is generally detectable in upper and lower  respiratory specimens during the acute phase of infection. The lowest  concentration of SARS-CoV-2 viral copies this assay can detect is 250  copies / mL. A negative result does not preclude SARS-CoV-2 infection  and should not be used as the sole basis for treatment or other  patient management decisions.  A negative result may occur with  improper specimen collection / handling, submission of specimen other  than nasopharyngeal swab, presence of viral mutation(s) within the  areas targeted by this assay, and inadequate number of viral copies  (<250 copies / mL). A negative result must be combined with clinical  observations, patient history, and epidemiological information. If result is POSITIVE SARS-CoV-2 target nucleic acids are DETECTED. The SARS-CoV-2 RNA is generally detectable in upper and lower  respiratory specimens dur ing the acute phase of infection.  Positive  results are indicative of active infection with SARS-CoV-2.  Clinical  correlation with patient history and other diagnostic information is  necessary to determine patient infection status.  Positive results do  not rule out bacterial infection or co-infection with other viruses. If result is PRESUMPTIVE POSTIVE SARS-CoV-2 nucleic acids MAY BE PRESENT.   A presumptive positive result was obtained on the submitted specimen  and confirmed on repeat  testing.  While 2019 novel coronavirus  (SARS-CoV-2) nucleic acids may be present in the submitted sample  additional confirmatory testing may be necessary for epidemiological  and / or clinical management purposes  to differentiate between  SARS-CoV-2 and other Sarbecovirus currently known to infect humans.  If clinically indicated additional testing with an alternate test  methodology [phone]) is advised. The SARS-CoV-2 RNA is generally  detectable in upper and lower respiratory sp ecimens during the acute  phase of infection. The expected result is Negative. Fact Sheet for Patients:  BoilerBrush.com.cy Fact Sheet for Healthcare Providers: https://pope.com/ This test is not yet approved or cleared by the [place] FDA and has been authorized for detection and/or diagnosis of SARS-CoV-2 by FDA under an Emergency Use Authorization (EUA).  This EUA will remain in effect (meaning this test can be used) for the duration of the COVID-19 declaration under Section 564(b)(1) of the Act, 21 U.S.C. section 360bbb-3(b)(1), unless the authorization is terminated or revoked sooner. Performed at [facility], [address], [place], [place] [address]   Respiratory Panel by PCR     Status: None   Collection Time: 02/19/19  3:20 AM  Result Value Ref Range Status   Adenovirus NOT DETECTED NOT DETECTED Final   Coronavirus 229E NOT DETECTED NOT DETECTED Final    Comment: (NOTE) The Coronavirus on the Respiratory Panel, DOES NOT test for the novel  Coronavirus (2019 nCoV)    Coronavirus HKU1 NOT DETECTED NOT DETECTED Final   Coronavirus NL63 NOT DETECTED NOT DETECTED Final   Coronavirus OC43 NOT DETECTED NOT DETECTED Final   Metapneumovirus NOT DETECTED NOT DETECTED Final   Rhinovirus / Enterovirus NOT DETECTED NOT DETECTED Final   Influenza A NOT DETECTED NOT DETECTED Final   Influenza B NOT DETECTED NOT DETECTED Final   Parainfluenza  Virus 1 NOT DETECTED NOT DETECTED Final   Parainfluenza Virus 2 NOT DETECTED NOT DETECTED Final   Parainfluenza Virus 3 NOT DETECTED NOT DETECTED Final   Parainfluenza Virus 4 NOT DETECTED NOT DETECTED Final   Respiratory Syncytial Virus NOT DETECTED NOT DETECTED Final   Bordetella pertussis NOT DETECTED NOT DETECTED Final   Chlamydophila pneumoniae NOT DETECTED NOT DETECTED Final   Mycoplasma pneumoniae NOT DETECTED NOT DETECTED Final    Comment: Performed at [facility], [address], [place], [place] [address]  SARS Coronavirus 2 [facility] order, Performed in [facility] hospital lab)     Status: None   Collection Time: 02/19/19  6:33 PM  Result Value Ref Range Status   SARS Coronavirus 2 NEGATIVE NEGATIVE Final    Comment: (NOTE) If result is NEGATIVE SARS-CoV-2 target nucleic acids are NOT DETECTED. The SARS-CoV-2 RNA is generally detectable in upper and lower  respiratory specimens during the acute phase of infection. The lowest  concentration of SARS-CoV-2 viral copies this assay can detect is 250  copies / mL. A negative result does not preclude SARS-CoV-2 infection  and should not be used as the sole basis for treatment or other  patient management decisions.  A negative result may  occur with  improper specimen collection / handling, submission of specimen other  than nasopharyngeal swab, presence of viral mutation(s) within the  areas targeted by this assay, and inadequate number of viral copies  (<250 copies / mL). A negative result must be combined with clinical  observations, patient history, and epidemiological information. If result is POSITIVE SARS-CoV-2 target nucleic acids are DETECTED. The SARS-CoV-2 RNA is generally detectable in upper and lower  respiratory specimens dur ing the acute phase of infection.  Positive  results are indicative of active infection with SARS-CoV-2.  Clinical  correlation with patient history and other diagnostic information is   necessary to determine patient infection status.  Positive results do  not rule out bacterial infection or co-infection with other viruses. If result is PRESUMPTIVE POSTIVE SARS-CoV-2 nucleic acids MAY BE PRESENT.   A presumptive positive result was obtained on the submitted specimen  and confirmed on repeat testing.  While 2019 novel coronavirus  (SARS-CoV-2) nucleic acids may be present in the submitted sample  additional confirmatory testing may be necessary for epidemiological  and / or clinical management purposes  to differentiate between  SARS-CoV-2 and other Sarbecovirus currently known to infect humans.  If clinically indicated additional testing with an alternate test  methodology [phone]) is advised. The SARS-CoV-2 RNA is generally  detectable in upper and lower respiratory sp ecimens during the acute  phase of infection. The expected result is Negative. Fact Sheet for Patients:  BoilerBrush.com.cy Fact Sheet for Healthcare Providers: https://pope.com/ This test is not yet approved or cleared by the [place] FDA and has been authorized for detection and/or diagnosis of SARS-CoV-2 by FDA under an Emergency Use Authorization (EUA).  This EUA will remain in effect (meaning this test can be used) for the duration of the COVID-19 declaration under Section 564(b)(1) of the Act, 21 U.S.C. section 360bbb-3(b)(1), unless the authorization is terminated or revoked sooner. Performed at [facility], [address], [place], [place] [address]   Surgical PCR screen     Status: None   Collection Time: 02/22/19  7:36 AM  Result Value Ref Range Status   MRSA, PCR NEGATIVE NEGATIVE Final   Staphylococcus aureus NEGATIVE NEGATIVE Final    Comment: (NOTE) The Xpert SA Assay (FDA approved for NASAL specimens in patients 42 years of age and older), is one component of a comprehensive surveillance program. It is not intended to  diagnose infection nor to guide or monitor treatment. Performed at [facility], [address], [place], [place] [address]   Culture, respiratory (non-expectorated)     Status: None   Collection Time: 02/25/19 12:15 PM  Result Value Ref Range Status   Specimen Description TRACHEAL ASPIRATE  Final   Special Requests NONE  Final   Gram Stain NO WBC SEEN NO ORGANISMS SEEN   Final   Culture   Final    FEW HAEMOPHILUS INFLUENZAE BETA LACTAMASE NEGATIVE Performed at [facility], [address], [place], [place] [address]    Report Status 02/26/2019 FINAL  Final  Culture, blood (Routine X 2) w Reflex to ID Panel     Status: None (Preliminary result)   Collection Time: 02/25/19  2:36 PM  Result Value Ref Range Status   Specimen Description BLOOD RIGHT WRIST  Final   Special Requests   Final    AEROBIC BOTTLE ONLY Blood Culture results may not be optimal due to an inadequate volume of blood received in culture bottles  Culture   Final    NO GROWTH 2 DAYS Performed at [facility], [address] [place], [place] [address]    Report Status PENDING  Incomplete  Culture, blood (Routine X 2) w Reflex to ID Panel     Status: None (Preliminary result)   Collection Time: 02/25/19  4:40 PM  Result Value Ref Range Status   Specimen Description BLOOD RIGHT HAND  Final   Special Requests AEROBIC BOTTLE ONLY Blood Culture adequate volume  Final   Culture   Final    NO GROWTH 2 DAYS Performed at [facility], [address] [place], [place] [address]    Report Status PENDING  Incomplete    Anti-infectives:  Anti-infectives (From admission, onward)   Start     Dose/Rate Route Frequency Ordered Stop   02/27/19 1000  Ampicillin-Sulbactam (UNASYN) 3 g in sodium chloride 0.9 % 100 mL IVPB     3 g 200 mL/hr over 30 Minutes Intravenous Every 6 hours 02/27/19 0956     02/25/19 1800  vancomycin (VANCOCIN) 2,000 mg in sodium chloride 0.9 % 500 mL IVPB  Status:   Discontinued     2,000 mg 250 mL/hr over 120 Minutes Intravenous Every 12 hours 02/25/19 1723 02/27/19 0956   02/25/19 1800  piperacillin-tazobactam (ZOSYN) IVPB 3.375 g  Status:  Discontinued     3.375 g 12.5 mL/hr over 240 Minutes Intravenous Every 8 hours 02/25/19 1723 02/27/19 0956      Best Practice/Protocols:  VTE Prophylaxis: Lovenox (prophylaxtic dose) Continous Sedation  Subjective:    Overnight Issues:   Objective:  Vital signs for last 24 hours: Temp:  [98.7 F (37.1 C)-101.7 F (38.7 C)] 99.5 F (37.5 C) (04/28 0400) Pulse Rate:  [70-100] 82 (04/28 0732) Resp:  [16-25] 20 (04/28 0732) BP: (122-144)/(55-91) 133/71 (04/28 0700) SpO2:  [91 %-100 %] 98 % (04/28 0732) FiO2 (%):  [40 %-50 %] 40 % (04/28 0732) Weight:  [102.8 kg] 102.8 kg (04/28 0500)  Hemodynamic parameters for last 24 hours:    Intake/Output from previous day: 04/27 0701 - 04/28 0700 In: 5931.5 [I.V.:4094.6; NG/GT:1105; IV Piggyback:731.8] Out: 5000 [Urine:5000]  Intake/Output this shift: No intake/output data recorded.  Vent settings for last 24 hours: Vent Mode: CPAP;PSV FiO2 (%):  [40 %-50 %] 40 % Set Rate:  [16 bmp] 16 bmp Vt Set:  [620 mL] 620 mL PEEP:  [5 cmH20] 5 cmH20 Pressure Support:  [10 cmH20] 10 cmH20 Plateau Pressure:  [18 cmH20-19 cmH20] 19 cmH20  Physical Exam:  General: agitated when sedation held Neuro: now sedated HEENT/Neck: ETT Resp: rhonchi B CVS: RRR GI: soft, open wound inferiorly more clean, +BS Extremities: edema 1+  Results for orders placed or performed during the hospital encounter of 02/18/19 (from the past 24 hour(s))  Glucose, capillary     Status: Abnormal   Collection Time: 02/27/19 11:29 AM  Result Value Ref Range   Glucose-Capillary 133 (H) 70 - 99 mg/dL   Comment 1 Notify RN    Comment 2 Document in Chart   Glucose, capillary     Status: Abnormal   Collection Time: 02/27/19  3:56 PM  Result Value Ref Range   Glucose-Capillary 151 (H) 70 -  99 mg/dL   Comment 1 Notify RN    Comment 2 Document in Chart   Glucose, capillary     Status: Abnormal   Collection Time: 02/27/19  7:48 PM  Result Value Ref Range   Glucose-Capillary 149 (H)  70 - 99 mg/dL  Glucose, capillary     Status: None   Collection Time: 02/27/19 11:20 PM  Result Value Ref Range   Glucose-Capillary 99 70 - 99 mg/dL  Glucose, capillary     Status: Abnormal   Collection Time: 02/28/19  3:26 AM  Result Value Ref Range   Glucose-Capillary 120 (H) 70 - 99 mg/dL  Glucose, capillary     Status: None   Collection Time: 02/28/19  7:56 AM  Result Value Ref Range   Glucose-Capillary 89 70 - 99 mg/dL   Comment 1 Notify RN    Comment 2 Document in Chart     Assessment & Plan: Present on Admission: . Liver laceration    LOS: 10 days   Additional comments:I reviewed the patient's new clinical lab test results. Marland Kitchen PHBC  Grade 4 liver laceration - S/P exploratory laparotomy, hepatorraphy, packing of liver 4/19 by Dr. Luisa Hart, S/P ex lap, removal of packs and partial closure 4/20 by Dr. Janee Morn. S/P ex lap and closure 4/22 by Dr. Janee Morn. L rib FX 2-3.  Pain control.   Acute hypoxic ventilator dependent respiratory failure - ETT changed 4/27 due to cuff leak, try to wean as able, sedation an issue with this ETOH abuse disorder/acute alcohol withdrawal - Precedex, Ativan, CSW eval later ; increase seroquel & klonopin ID - Unasyn for H flu PNA C spine - plan to examine once more alert ABL anemia - CBC in AM FEN - TF, large BM this AM, BMET now, diuresed 5L yesterday VTE - PAS, lovenox Dispo - ICU, wean Critical Care Total Time*: 16 Minutes  Violeta Gelinas, MD, MPH, FACS Trauma: [phone] General Surgery: [phone]  02/28/2019  *Care during the described time interval was provided by me. I have reviewed this patient's available data, including medical history, events of note, physical examination and test results as part of my evaluation.

## 2019-02-28 NOTE — Plan of Care (Signed)
  Problem: Nutrition: Goal: Adequate nutrition will be maintained Outcome: Progressing   Problem: Elimination: Goal: Will not experience complications related to bowel motility Outcome: Progressing Goal: Will not experience complications related to urinary retention Outcome: Progressing  Pt urinating and having bowel movements.  Problem: Safety: Goal: Ability to remain free from injury will improve Outcome: Progressing   Problem: Skin Integrity: Goal: Demonstration of wound healing without infection will improve Outcome: Progressing

## 2019-03-01 ENCOUNTER — Inpatient Hospital Stay (HOSPITAL_COMMUNITY): Payer: Medicaid Other

## 2019-03-01 LAB — GLUCOSE, CAPILLARY
Glucose-Capillary: 110 mg/dL — ABNORMAL HIGH (ref 70–99)
Glucose-Capillary: 114 mg/dL — ABNORMAL HIGH (ref 70–99)
Glucose-Capillary: 115 mg/dL — ABNORMAL HIGH (ref 70–99)
Glucose-Capillary: 115 mg/dL — ABNORMAL HIGH (ref 70–99)
Glucose-Capillary: 123 mg/dL — ABNORMAL HIGH (ref 70–99)
Glucose-Capillary: 127 mg/dL — ABNORMAL HIGH (ref 70–99)

## 2019-03-01 LAB — BASIC METABOLIC PANEL
Anion gap: 9 (ref 5–15)
BUN: 8 mg/dL (ref 6–20)
CO2: 26 mmol/L (ref 22–32)
Calcium: 8.3 mg/dL — ABNORMAL LOW (ref 8.9–10.3)
Chloride: 104 mmol/L (ref 98–111)
Creatinine, Ser: 0.6 mg/dL — ABNORMAL LOW (ref 0.61–1.24)
GFR calc Af Amer: >60 mL/min (ref >60)
GFR calc non Af Amer: >60 mL/min (ref >60)
Glucose, Bld: 122 mg/dL — ABNORMAL HIGH (ref 70–99)
Potassium: 3.7 mmol/L (ref 3.5–5.1)
Sodium: 139 mmol/L (ref 135–145)

## 2019-03-01 LAB — CBC
HCT: 30.5 % — ABNORMAL LOW (ref 39.0–52.0)
Hemoglobin: 9.7 g/dL — ABNORMAL LOW (ref 13.0–17.0)
MCH: 31.6 pg (ref 26.0–34.0)
MCHC: 31.8 g/dL (ref 30.0–36.0)
MCV: 99.3 fL (ref 80.0–100.0)
Platelets: 415 10*3/uL — ABNORMAL HIGH (ref 150–400)
RBC: 3.07 MIL/uL — ABNORMAL LOW (ref 4.22–5.81)
RDW: 13.8 % (ref 11.5–15.5)
WBC: 7.6 10*3/uL (ref 4.0–10.5)
nRBC: 0 % (ref 0.0–0.2)

## 2019-03-01 MED ORDER — PANTOPRAZOLE SODIUM 40 MG PO PACK
40.0000 mg | PACK | Freq: Every day | ORAL | Status: DC
  Administered 2019-03-01 – 2019-03-10 (×10): 40 mg
  Filled 2019-03-01 (×10): qty 20

## 2019-03-01 MED ORDER — PIVOT 1.5 CAL PO LIQD
1000.0000 mL | ORAL | Status: DC
  Administered 2019-03-02 – 2019-03-09 (×6): 1000 mL

## 2019-03-01 NOTE — Progress Notes (Signed)
Follow up - Trauma and Critical Care  Patient Details:    Jeremy [name] is an 23 y.o. male.  Lines/tubes : Airway 8 mm (Active)  Secured at (cm) 23 cm 03/01/2019  7:48 AM  Measured From Lips 03/01/2019  7:48 AM  Secured Location Left 03/01/2019  7:48 AM  Secured By [employer] 03/01/2019  7:48 AM  Tube Holder Repositioned Yes 03/01/2019  7:48 AM  Cuff Pressure (cm H2O) 28 cm H2O 02/27/2019  8:43 PM  Site Condition Dry 03/01/2019  7:48 AM     PICC Triple Lumen 02/22/19 PICC Left Brachial 48 cm 0 cm (Active)  Indication for Insertion or Continuance of Line Prolonged intravenous therapies 03/01/2019  8:00 AM  Exposed Catheter (cm) 0 cm 02/22/2019  2:31 PM  Site Assessment Clean;Dry;Intact 03/01/2019  6:00 AM  Lumen #1 Status Infusing;Flushed 02/28/2019  8:00 PM  Lumen #2 Status Infusing;Flushed 02/28/2019  8:00 PM  Lumen #3 Status In-line blood sampling system in place 02/28/2019  8:00 PM  Dressing Type Transparent;Occlusive 02/28/2019  8:00 PM  Dressing Status Clean;Dry;Intact;Antimicrobial disc in place 03/01/2019  6:00 AM  Line Care Connections checked and tightened;Leveled 02/28/2019  8:00 PM  Dressing Intervention Dressing changed;Antimicrobial disc changed 03/01/2019  6:00 AM  Dressing Change Due 03/08/19 03/01/2019  6:00 AM     Rectal Tube/Pouch (Active)  Output (mL) 100 mL 03/01/2019  6:00 AM     External Urinary Catheter (Active)  Collection Container Leg bag 02/28/2019  8:00 PM  Intervention Equipment Changed 02/27/2019  6:58 PM  Output (mL) 750 mL 03/01/2019  8:07 AM    Microbiology/Sepsis markers: Results for orders placed or performed during the hospital encounter of 02/18/19  MRSA PCR Screening     Status: None   Collection Time: 02/19/19  2:02 AM  Result Value Ref Range Status   MRSA by PCR NEGATIVE NEGATIVE Final    Comment:        The GeneXpert MRSA Assay (FDA approved for NASAL specimens only), is one component of a comprehensive MRSA colonization surveillance  program. It is not intended to diagnose MRSA infection nor to guide or monitor treatment for MRSA infections. Performed at [facility], [address], [place], [place] [address]   SARS Coronavirus 2 [facility] order, Performed in [facility] lab)     Status: None   Collection Time: 02/19/19  3:20 AM  Result Value Ref Range Status   SARS Coronavirus 2 NEGATIVE NEGATIVE Final    Comment: (NOTE) If result is NEGATIVE SARS-CoV-2 target nucleic acids are NOT DETECTED. The SARS-CoV-2 RNA is generally detectable in upper and lower  respiratory specimens during the acute phase of infection. The lowest  concentration of SARS-CoV-2 viral copies this assay can detect is 250  copies / mL. A negative result does not preclude SARS-CoV-2 infection  and should not be used as the sole basis for treatment or other  patient management decisions.  A negative result may occur with  improper specimen collection / handling, submission of specimen other  than nasopharyngeal swab, presence of viral mutation(s) within the  areas targeted by this assay, and inadequate number of viral copies  (<250 copies / mL). A negative result must be combined with clinical  observations, patient history, and epidemiological information. If result is POSITIVE SARS-CoV-2 target nucleic acids are DETECTED. The SARS-CoV-2 RNA is generally detectable in upper and lower  respiratory specimens dur ing the acute phase of infection.  Positive  results are indicative of  active infection with SARS-CoV-2.  Clinical  correlation with patient history and other diagnostic information is  necessary to determine patient infection status.  Positive results do  not rule out bacterial infection or co-infection with other viruses. If result is PRESUMPTIVE POSTIVE SARS-CoV-2 nucleic acids MAY BE PRESENT.   A presumptive positive result was obtained on the submitted specimen  and confirmed on repeat testing.  While 2019 novel  coronavirus  (SARS-CoV-2) nucleic acids may be present in the submitted sample  additional confirmatory testing may be necessary for epidemiological  and / or clinical management purposes  to differentiate between  SARS-CoV-2 and other Sarbecovirus currently known to infect humans.  If clinically indicated additional testing with an alternate test  methodology [phone]([id number]) is advised. The SARS-CoV-2 RNA is generally  detectable in upper and lower respiratory sp ecimens during the acute  phase of infection. The expected result is Negative. Fact Sheet for Patients:  BoilerBrush.com.cyhttps://www.fda.gov/media/136312/download Fact Sheet for Healthcare Providers: https://pope.com/https://www.fda.gov/media/136313/download This test is not yet approved or cleared by the [place] FDA and has been authorized for detection and/or diagnosis of SARS-CoV-2 by FDA under an Emergency Use Authorization (EUA).  This EUA will remain in effect (meaning this test can be used) for the duration of the COVID-19 declaration under Section 564(b)(1) of the Act, 21 U.S.C. section 360bbb-3(b)(1), unless the authorization is terminated or revoked sooner. Performed at [facility], [address] [place], [place] [address]   Respiratory Panel by PCR     Status: None   Collection Time: 02/19/19  3:20 AM  Result Value Ref Range Status   Adenovirus NOT DETECTED NOT DETECTED Final   Coronavirus 229E NOT DETECTED NOT DETECTED Final    Comment: (NOTE) The Coronavirus on the Respiratory Panel, DOES NOT test for the novel  Coronavirus (2019 nCoV)    Coronavirus HKU1 NOT DETECTED NOT DETECTED Final   Coronavirus NL63 NOT DETECTED NOT DETECTED Final   Coronavirus OC43 NOT DETECTED NOT DETECTED Final   Metapneumovirus NOT DETECTED NOT DETECTED Final   Rhinovirus / Enterovirus NOT DETECTED NOT DETECTED Final   Influenza A NOT DETECTED NOT DETECTED Final   Influenza B NOT DETECTED NOT DETECTED Final   Parainfluenza Virus 1 NOT DETECTED NOT  DETECTED Final   Parainfluenza Virus 2 NOT DETECTED NOT DETECTED Final   Parainfluenza Virus 3 NOT DETECTED NOT DETECTED Final   Parainfluenza Virus 4 NOT DETECTED NOT DETECTED Final   Respiratory Syncytial Virus NOT DETECTED NOT DETECTED Final   Bordetella pertussis NOT DETECTED NOT DETECTED Final   Chlamydophila pneumoniae NOT DETECTED NOT DETECTED Final   Mycoplasma pneumoniae NOT DETECTED NOT DETECTED Final    Comment: Performed at [facility], [address] [place], [place] [address]  SARS Coronavirus 2 [facility](Hospital order, Performed in [facility] hospital lab)     Status: None   Collection Time: 02/19/19  6:33 PM  Result Value Ref Range Status   SARS Coronavirus 2 NEGATIVE NEGATIVE Final    Comment: (NOTE) If result is NEGATIVE SARS-CoV-2 target nucleic acids are NOT DETECTED. The SARS-CoV-2 RNA is generally detectable in upper and lower  respiratory specimens during the acute phase of infection. The lowest  concentration of SARS-CoV-2 viral copies this assay can detect is 250  copies / mL. A negative result does not preclude SARS-CoV-2 infection  and should not be used as the sole basis for treatment or other  patient management decisions.  A negative result may occur with  improper  specimen collection / handling, submission of specimen other  than nasopharyngeal swab, presence of viral mutation(s) within the  areas targeted by this assay, and inadequate number of viral copies  (<250 copies / mL). A negative result must be combined with clinical  observations, patient history, and epidemiological information. If result is POSITIVE SARS-CoV-2 target nucleic acids are DETECTED. The SARS-CoV-2 RNA is generally detectable in upper and lower  respiratory specimens dur ing the acute phase of infection.  Positive  results are indicative of active infection with SARS-CoV-2.  Clinical  correlation with patient history and other diagnostic information is  necessary to determine  patient infection status.  Positive results do  not rule out bacterial infection or co-infection with other viruses. If result is PRESUMPTIVE POSTIVE SARS-CoV-2 nucleic acids MAY BE PRESENT.   A presumptive positive result was obtained on the submitted specimen  and confirmed on repeat testing.  While 2019 novel coronavirus  (SARS-CoV-2) nucleic acids may be present in the submitted sample  additional confirmatory testing may be necessary for epidemiological  and / or clinical management purposes  to differentiate between  SARS-CoV-2 and other Sarbecovirus currently known to infect humans.  If clinically indicated additional testing with an alternate test  methodology [phone]) is advised. The SARS-CoV-2 RNA is generally  detectable in upper and lower respiratory sp ecimens during the acute  phase of infection. The expected result is Negative. Fact Sheet for Patients:  BoilerBrush.com.cy Fact Sheet for Healthcare Providers: https://pope.com/ This test is not yet approved or cleared by the [place] FDA and has been authorized for detection and/or diagnosis of SARS-CoV-2 by FDA under an Emergency Use Authorization (EUA).  This EUA will remain in effect (meaning this test can be used) for the duration of the COVID-19 declaration under Section 564(b)(1) of the Act, 21 U.S.C. section 360bbb-3(b)(1), unless the authorization is terminated or revoked sooner. Performed at [facility], [address], [place], [place] [address]   Surgical PCR screen     Status: None   Collection Time: 02/22/19  7:36 AM  Result Value Ref Range Status   MRSA, PCR NEGATIVE NEGATIVE Final   Staphylococcus aureus NEGATIVE NEGATIVE Final    Comment: (NOTE) The Xpert SA Assay (FDA approved for NASAL specimens in patients 17 years of age and older), is one component of a comprehensive surveillance program. It is not intended to diagnose infection nor  to guide or monitor treatment. Performed at [facility], [address], [place], [place] [address]   Culture, respiratory (non-expectorated)     Status: None   Collection Time: 02/25/19 12:15 PM  Result Value Ref Range Status   Specimen Description TRACHEAL ASPIRATE  Final   Special Requests NONE  Final   Gram Stain NO WBC SEEN NO ORGANISMS SEEN   Final   Culture   Final    FEW HAEMOPHILUS INFLUENZAE BETA LACTAMASE NEGATIVE Performed at [facility], [address] [place], [place] [address]    Report Status 02/26/2019 FINAL  Final  Culture, blood (Routine X 2) w Reflex to ID Panel     Status: None (Preliminary result)   Collection Time: 02/25/19  2:36 PM  Result Value Ref Range Status   Specimen Description BLOOD RIGHT WRIST  Final   Special Requests   Final    AEROBIC BOTTLE ONLY Blood Culture results may not be optimal due to an inadequate volume of blood received in culture bottles   Culture   Final  NO GROWTH 3 DAYS Performed at [facility], [address], [place], [place] [address]    Report Status PENDING  Incomplete  Culture, blood (Routine X 2) w Reflex to ID Panel     Status: None (Preliminary result)   Collection Time: 02/25/19  4:40 PM  Result Value Ref Range Status   Specimen Description BLOOD RIGHT HAND  Final   Special Requests AEROBIC BOTTLE ONLY Blood Culture adequate volume  Final   Culture   Final    NO GROWTH 3 DAYS Performed at [facility], [address], [place], [place] [address]    Report Status PENDING  Incomplete    Anti-infectives:  Anti-infectives (From admission, onward)   Start     Dose/Rate Route Frequency Ordered Stop   02/27/19 1000  Ampicillin-Sulbactam (UNASYN) 3 g in sodium chloride 0.9 % 100 mL IVPB     3 g 200 mL/hr over 30 Minutes Intravenous Every 6 hours 02/27/19 0956     02/25/19 1800  vancomycin (VANCOCIN) 2,000 mg in sodium chloride 0.9 % 500 mL IVPB  Status:  Discontinued     2,000 mg 250  mL/hr over 120 Minutes Intravenous Every 12 hours 02/25/19 1723 02/27/19 0956   02/25/19 1800  piperacillin-tazobactam (ZOSYN) IVPB 3.375 g  Status:  Discontinued     3.375 g 12.5 mL/hr over 240 Minutes Intravenous Every 8 hours 02/25/19 1723 02/27/19 0956      Best Practice/Protocols:  VTE Prophylaxis: Lovenox (prophylaxtic dose) Continous Sedation  Consults:     Events:  Chief Complaint/Subjective:    Overnight Issues:  unable to wean sedation, wild overnight  Objective:  Vital signs for last 24 hours: Temp:  [98.4 F (36.9 C)-101.2 F (38.4 C)] 99.6 F (37.6 C) (04/29 0800) Pulse Rate:  [72-109] 77 (04/29 0748) Resp:  [13-30] 17 (04/29 0600) BP: (103-139)/(46-91) 121/80 (04/29 0748) SpO2:  [94 %-100 %] 100 % (04/29 0748) FiO2 (%):  [40 %] 40 % (04/29 0748) Weight:  [103.1 kg] 103.1 kg (04/29 0500)  Hemodynamic parameters for last 24 hours:    Intake/Output from previous day: 04/28 0701 - 04/29 0700 In: 5356.9 [I.V.:3437.1; NG/GT:1495; IV Piggyback:424.9] Out: 4900 [Urine:4800; Stool:100]  Intake/Output this shift: Total I/O In: -  Out: 750 [Urine:750]  Vent settings for last 24 hours: Vent Mode: CPAP;PSV FiO2 (%):  [40 %] 40 % Set Rate:  [16 bmp] 16 bmp Vt Set:  [671 mL] 620 mL PEEP:  [5 cmH20] 5 cmH20 Pressure Support:  [10 cmH20] 10 cmH20 Plateau Pressure:  [12 cmH20-19 cmH20] 19 cmH20  Physical Exam:  Gen: intubated, sedated HEENT: tube in place, some blood in supraglottic tube Resp: weaning, assisted Cardiovascular: RRR Abdomen: soft, ND, incision with open area at base small amount thin drainage from base, otherwise wound C/D/I Ext: no edema Neuro: GCS 9t  Results for orders placed or performed during the hospital encounter of 02/18/19 (from the past 24 hour(s))  Triglycerides     Status: Abnormal   Collection Time: 02/28/19 10:13 AM  Result Value Ref Range   Triglycerides 536 (H) <150 mg/dL  Basic metabolic panel     Status: Abnormal    Collection Time: 02/28/19 10:13 AM  Result Value Ref Range   Sodium 141 135 - 145 mmol/L   Potassium 3.7 3.5 - 5.1 mmol/L   Chloride 110 98 - 111 mmol/L   CO2 23 22 - 32 mmol/L   Glucose, Bld 159 (H) 70 - 99 mg/dL  BUN 7 6 - 20 mg/dL   Creatinine, Ser 1.61 (L) 0.61 - 1.24 mg/dL   Calcium 7.6 (L) 8.9 - 10.3 mg/dL   GFR calc non Af Amer >60 >60 mL/min   GFR calc Af Amer >60 >60 mL/min   Anion gap 8 5 - 15  Glucose, capillary     Status: Abnormal   Collection Time: 02/28/19 12:07 PM  Result Value Ref Range   Glucose-Capillary 105 (H) 70 - 99 mg/dL   Comment 1 Notify RN    Comment 2 Document in Chart   Glucose, capillary     Status: None   Collection Time: 02/28/19  3:44 PM  Result Value Ref Range   Glucose-Capillary 95 70 - 99 mg/dL   Comment 1 Notify RN    Comment 2 Document in Chart   Glucose, capillary     Status: Abnormal   Collection Time: 02/28/19  7:47 PM  Result Value Ref Range   Glucose-Capillary 110 (H) 70 - 99 mg/dL  Glucose, capillary     Status: Abnormal   Collection Time: 02/28/19 11:22 PM  Result Value Ref Range   Glucose-Capillary 111 (H) 70 - 99 mg/dL   Comment 1 Notify RN    Comment 2 Document in Chart   Glucose, capillary     Status: Abnormal   Collection Time: 03/01/19  3:44 AM  Result Value Ref Range   Glucose-Capillary 115 (H) 70 - 99 mg/dL  CBC     Status: Abnormal   Collection Time: 03/01/19  4:31 AM  Result Value Ref Range   WBC 7.6 4.0 - 10.5 K/uL   RBC 3.07 (L) 4.22 - 5.81 MIL/uL   Hemoglobin 9.7 (L) 13.0 - 17.0 g/dL   HCT 09.6 (L) 04.5 - 40.9 %   MCV 99.3 80.0 - 100.0 fL   MCH 31.6 26.0 - 34.0 pg   MCHC 31.8 30.0 - 36.0 g/dL   RDW 81.1 91.4 - 78.2 %   Platelets 415 (H) 150 - 400 K/uL   nRBC 0.0 0.0 - 0.2 %  Basic metabolic panel     Status: Abnormal   Collection Time: 03/01/19  4:31 AM  Result Value Ref Range   Sodium 139 135 - 145 mmol/L   Potassium 3.7 3.5 - 5.1 mmol/L   Chloride 104 98 - 111 mmol/L   CO2 26 22 - 32 mmol/L    Glucose, Bld 122 (H) 70 - 99 mg/dL   BUN 8 6 - 20 mg/dL   Creatinine, Ser 9.56 (L) 0.61 - 1.24 mg/dL   Calcium 8.3 (L) 8.9 - 10.3 mg/dL   GFR calc non Af Amer >60 >60 mL/min   GFR calc Af Amer >60 >60 mL/min   Anion gap 9 5 - 15  Glucose, capillary     Status: Abnormal   Collection Time: 03/01/19  7:42 AM  Result Value Ref Range   Glucose-Capillary 114 (H) 70 - 99 mg/dL   Comment 1 Notify RN    Comment 2 Document in Chart      Assessment/Plan:   PHBC  Grade 4 liver laceration - S/P exploratory laparotomy, hepatorraphy, packing of liver 4/19 by Dr. Luisa Hart, S/P ex lap, removal of packs and partial closure 4/20 by Dr. Janee Morn. S/P ex lap and closure 4/22 by Dr. Janee Morn. L rib FX 2-3.  Pain control.   Acute hypoxic ventilator dependent respiratory failure - ETT changed 4/27 due to cuff leak, try to wean as able, sedation still an issue with  this ETOH abuse disorder/acute alcohol withdrawal - Precedex, Ativan, CSW eval later ; increase seroquel & klonopin ID - Unasyn for H flu PNA C spine - plan to examine once more alert ABL anemia - CBC in AM FEN - tolerating TF, lytes in range, diuresed 4.5L yesterday VTE - PAS, lovenox Dispo - ICU, weaning sedation to extubate   LOS: 11 days   Additional comments:I reviewed the patient's new clinical lab test results. electrolytes in range, CBC stable  Critical Care Total Time*: 30 Minutes  De Blanch Teyana Pierron 03/01/2019  *Care during the described time interval was provided by me and/or other providers on the critical care team.  I have reviewed this patient's available data, including medical history, events of note, physical examination and test results as part of my evaluation.

## 2019-03-01 NOTE — Progress Notes (Signed)
Nutrition Follow-up RD working remotely.  DOCUMENTATION CODES:   Not applicable  INTERVENTION:   Tube Feeding:  Increase Pivot 1.5 to 70 ml/hr via Cortrak   Provides 2520 kcals, 157 g of protein and 1275 mL of free water  NUTRITION DIAGNOSIS:   Increased nutrient needs related to (trauma) as evidenced by estimated needs.  Being addressed via TF   GOAL:   Patient will meet greater than or equal to 90% of their needs  Met  MONITOR:   I & O's, Vent status, TF tolerance  REASON FOR ASSESSMENT:   Consult Enteral/tube feeding initiation and management  ASSESSMENT:   Pt with no know PMH admitted as a PHBC with grade 4 liver laceration s/p ex lap, hepatorraphy, packing of liver 4/19, s/p ex lap, removal of packs and partial closure 4/20, with abd closure 4/22, L rib fxs 2-3.    4/22 TF started via OG tube 4/23 Extubated 4/24 Cortrak placed (gastric) 4/25 Re-Intubated  Patient is currently intubated on ventilator support MV: 7.8 L/min Temp (24hrs), Avg:99.5 F (37.5 C), Min:98.4 F (36.9 C), Max:101.2 F (38.4 C)  Propofol: off  Pt is + 22 L  Labs: reviewed Meds: miralax, lasix, 40 mEq KCl BID, D5-NS at 50 ml/hr    Diet Order:   Diet Order            Diet NPO time specified  Diet effective now              EDUCATION NEEDS:   No education needs have been identified at this time  Skin:  Skin Assessment: Reviewed RN Assessment(abd incision)  Last BM:  4/28 large type 7 BM, now with rectal tube  Height:   Ht Readings from Last 1 Encounters:  02/18/19 6' (1.829 m)    Weight:   Wt Readings from Last 1 Encounters:  03/01/19 103.1 kg    Ideal Body Weight:  80.9 kg  BMI:  Body mass index is 30.83 kg/m.  Estimated Nutritional Needs:   Kcal:  5003  Protein:  140-160 grams  Fluid:  > 2 L/day  Maylon Peppers RD, LDN, CNSC [phone] Pager [phone] After Hours Pager

## 2019-03-02 ENCOUNTER — Inpatient Hospital Stay (HOSPITAL_COMMUNITY): Payer: Medicaid Other

## 2019-03-02 LAB — BASIC METABOLIC PANEL
Anion gap: 5 (ref 5–15)
BUN: 10 mg/dL (ref 6–20)
CO2: 29 mmol/L (ref 22–32)
Calcium: 8.1 mg/dL — ABNORMAL LOW (ref 8.9–10.3)
Chloride: 105 mmol/L (ref 98–111)
Creatinine, Ser: 0.63 mg/dL (ref 0.61–1.24)
GFR calc Af Amer: >60 mL/min (ref >60)
GFR calc non Af Amer: >60 mL/min (ref >60)
Glucose, Bld: 133 mg/dL — ABNORMAL HIGH (ref 70–99)
Potassium: 3.8 mmol/L (ref 3.5–5.1)
Sodium: 139 mmol/L (ref 135–145)

## 2019-03-02 LAB — GLUCOSE, CAPILLARY
Glucose-Capillary: 116 mg/dL — ABNORMAL HIGH (ref 70–99)
Glucose-Capillary: 115 mg/dL — ABNORMAL HIGH (ref 70–99)
Glucose-Capillary: 100 mg/dL — ABNORMAL HIGH (ref 70–99)
Glucose-Capillary: 116 mg/dL — ABNORMAL HIGH (ref 70–99)
Glucose-Capillary: 111 mg/dL — ABNORMAL HIGH (ref 70–99)
Glucose-Capillary: 110 mg/dL — ABNORMAL HIGH (ref 70–99)

## 2019-03-02 LAB — CULTURE, BLOOD (ROUTINE X 2)
Culture: NO GROWTH
Culture: NO GROWTH
Special Requests: ADEQUATE

## 2019-03-02 NOTE — Progress Notes (Signed)
Patient ID: Jeremy [name], male   DOB: 08/26/1996, 23 y.o.   MRN: [id number] Follow up - Trauma Critical Care  Patient Details:    Jeremy [name] is an 23 y.o. male.  Lines/tubes : Airway 8 mm (Active)  Secured at (cm) 24 cm 03/02/2019  8:06 AM  Measured From Lips 03/02/2019  8:06 AM  Secured Location Center 03/02/2019  8:06 AM  Secured By [employer] 03/02/2019  8:06 AM  Tube Holder Repositioned Yes 03/02/2019  8:06 AM  Cuff Pressure (cm H2O) 28 cm H2O 02/27/2019  8:43 PM  Site Condition Dry 03/02/2019  8:06 AM     PICC Triple Lumen 02/22/19 PICC Left Brachial 48 cm 0 cm (Active)  Indication for Insertion or Continuance of Line Prolonged intravenous therapies 03/01/2019  8:00 PM  Exposed Catheter (cm) 0 cm 02/22/2019  2:31 PM  Site Assessment Clean;Dry;Intact 03/01/2019  8:00 PM  Lumen #1 Status Infusing;Flushed 03/01/2019  8:00 PM  Lumen #2 Status Infusing;Flushed 03/01/2019  8:00 PM  Lumen #3 Status In-line blood sampling system in place 03/01/2019  8:00 PM  Dressing Type Transparent;Occlusive 03/01/2019  8:00 PM  Dressing Status Clean;Dry;Intact;Antimicrobial disc in place 03/01/2019  8:00 PM  Line Care Lumen 3 tubing changed 03/02/2019  7:00 AM  Dressing Intervention Dressing changed;Antimicrobial disc changed 03/01/2019  6:00 AM  Dressing Change Due 03/08/19 03/01/2019  8:00 PM     Rectal Tube/Pouch (Active)  Output (mL) 100 mL 03/01/2019  6:00 AM     External Urinary Catheter (Active)  Collection Container Standard drainage bag 03/01/2019  8:00 PM  Intervention Equipment Changed 02/27/2019  6:58 PM  Output (mL) 1000 mL 03/02/2019  4:00 AM    Microbiology/Sepsis markers: Results for orders placed or performed during the hospital encounter of 02/18/19  MRSA PCR Screening     Status: None   Collection Time: 02/19/19  2:02 AM  Result Value Ref Range Status   MRSA by PCR NEGATIVE NEGATIVE Final    Comment:        The GeneXpert MRSA Assay (FDA approved for NASAL specimens only),  is one component of a comprehensive MRSA colonization surveillance program. It is not intended to diagnose MRSA infection nor to guide or monitor treatment for MRSA infections. Performed at [facility], [address], [place], [place] [address]   SARS Coronavirus 2 [facility] order, Performed in [facility] hospital lab)     Status: None   Collection Time: 02/19/19  3:20 AM  Result Value Ref Range Status   SARS Coronavirus 2 NEGATIVE NEGATIVE Final    Comment: (NOTE) If result is NEGATIVE SARS-CoV-2 target nucleic acids are NOT DETECTED. The SARS-CoV-2 RNA is generally detectable in upper and lower  respiratory specimens during the acute phase of infection. The lowest  concentration of SARS-CoV-2 viral copies this assay can detect is 250  copies / mL. A negative result does not preclude SARS-CoV-2 infection  and should not be used as the sole basis for treatment or other  patient management decisions.  A negative result may occur with  improper specimen collection / handling, submission of specimen other  than nasopharyngeal swab, presence of viral mutation(s) within the  areas targeted by this assay, and inadequate number of viral copies  (<250 copies / mL). A negative result must be combined with clinical  observations, patient history, and epidemiological information. If result is POSITIVE SARS-CoV-2 target nucleic acids are DETECTED. The SARS-CoV-2 RNA is generally detectable in upper and lower  respiratory  specimens dur ing the acute phase of infection.  Positive  results are indicative of active infection with SARS-CoV-2.  Clinical  correlation with patient history and other diagnostic information is  necessary to determine patient infection status.  Positive results do  not rule out bacterial infection or co-infection with other viruses. If result is PRESUMPTIVE POSTIVE SARS-CoV-2 nucleic acids MAY BE PRESENT.   A presumptive positive result was obtained on the  submitted specimen  and confirmed on repeat testing.  While 2019 novel coronavirus  (SARS-CoV-2) nucleic acids may be present in the submitted sample  additional confirmatory testing may be necessary for epidemiological  and / or clinical management purposes  to differentiate between  SARS-CoV-2 and other Sarbecovirus currently known to infect humans.  If clinically indicated additional testing with an alternate test  methodology [phone] is advised. The SARS-CoV-2 RNA is generally  detectable in upper and lower respiratory sp ecimens during the acute  phase of infection. The expected result is Negative. Fact Sheet for Patients:  BoilerBrush.com.cy Fact Sheet for Healthcare Providers: https://pope.com/ This test is not yet approved or cleared by the [place] FDA and has been authorized for detection and/or diagnosis of SARS-CoV-2 by FDA under an Emergency Use Authorization (EUA).  This EUA will remain in effect (meaning this test can be used) for the duration of the COVID-19 declaration under Section 564(b)(1) of the Act, 21 U.S.C. section 360bbb-3(b)(1), unless the authorization is terminated or revoked sooner. Performed at [facility], [address] [place], [place] [address]   Respiratory Panel by PCR     Status: None   Collection Time: 02/19/19  3:20 AM  Result Value Ref Range Status   Adenovirus NOT DETECTED NOT DETECTED Final   Coronavirus 229E NOT DETECTED NOT DETECTED Final    Comment: (NOTE) The Coronavirus on the Respiratory Panel, DOES NOT test for the novel  Coronavirus (2019 nCoV)    Coronavirus HKU1 NOT DETECTED NOT DETECTED Final   Coronavirus NL63 NOT DETECTED NOT DETECTED Final   Coronavirus OC43 NOT DETECTED NOT DETECTED Final   Metapneumovirus NOT DETECTED NOT DETECTED Final   Rhinovirus / Enterovirus NOT DETECTED NOT DETECTED Final   Influenza A NOT DETECTED NOT DETECTED Final   Influenza B NOT  DETECTED NOT DETECTED Final   Parainfluenza Virus 1 NOT DETECTED NOT DETECTED Final   Parainfluenza Virus 2 NOT DETECTED NOT DETECTED Final   Parainfluenza Virus 3 NOT DETECTED NOT DETECTED Final   Parainfluenza Virus 4 NOT DETECTED NOT DETECTED Final   Respiratory Syncytial Virus NOT DETECTED NOT DETECTED Final   Bordetella pertussis NOT DETECTED NOT DETECTED Final   Chlamydophila pneumoniae NOT DETECTED NOT DETECTED Final   Mycoplasma pneumoniae NOT DETECTED NOT DETECTED Final    Comment: Performed at [facility], [address] [place], [place] [address]  SARS Coronavirus 2 [facility] order, Performed in [facility] hospital lab)     Status: None   Collection Time: 02/19/19  6:33 PM  Result Value Ref Range Status   SARS Coronavirus 2 NEGATIVE NEGATIVE Final    Comment: (NOTE) If result is NEGATIVE SARS-CoV-2 target nucleic acids are NOT DETECTED. The SARS-CoV-2 RNA is generally detectable in upper and lower  respiratory specimens during the acute phase of infection. The lowest  concentration of SARS-CoV-2 viral copies this assay can detect is 250  copies / mL. A negative result does not preclude SARS-CoV-2 infection  and should not be used as the sole basis for treatment  or other  patient management decisions.  A negative result may occur with  improper specimen collection / handling, submission of specimen other  than nasopharyngeal swab, presence of viral mutation(s) within the  areas targeted by this assay, and inadequate number of viral copies  (<250 copies / mL). A negative result must be combined with clinical  observations, patient history, and epidemiological information. If result is POSITIVE SARS-CoV-2 target nucleic acids are DETECTED. The SARS-CoV-2 RNA is generally detectable in upper and lower  respiratory specimens dur ing the acute phase of infection.  Positive  results are indicative of active infection with SARS-CoV-2.  Clinical  correlation with patient  history and other diagnostic information is  necessary to determine patient infection status.  Positive results do  not rule out bacterial infection or co-infection with other viruses. If result is PRESUMPTIVE POSTIVE SARS-CoV-2 nucleic acids MAY BE PRESENT.   A presumptive positive result was obtained on the submitted specimen  and confirmed on repeat testing.  While 2019 novel coronavirus  (SARS-CoV-2) nucleic acids may be present in the submitted sample  additional confirmatory testing may be necessary for epidemiological  and / or clinical management purposes  to differentiate between  SARS-CoV-2 and other Sarbecovirus currently known to infect humans.  If clinically indicated additional testing with an alternate test  methodology [phone]([id number]) is advised. The SARS-CoV-2 RNA is generally  detectable in upper and lower respiratory sp ecimens during the acute  phase of infection. The expected result is Negative. Fact Sheet for Patients:  BoilerBrush.com.cyhttps://www.fda.gov/media/136312/download Fact Sheet for Healthcare Providers: https://pope.com/https://www.fda.gov/media/136313/download This test is not yet approved or cleared by the [place] FDA and has been authorized for detection and/or diagnosis of SARS-CoV-2 by FDA under an Emergency Use Authorization (EUA).  This EUA will remain in effect (meaning this test can be used) for the duration of the COVID-19 declaration under Section 564(b)(1) of the Act, 21 U.S.C. section 360bbb-3(b)(1), unless the authorization is terminated or revoked sooner. Performed at [facility], [address] [place], [place] [address]   Surgical PCR screen     Status: None   Collection Time: 02/22/19  7:36 AM  Result Value Ref Range Status   MRSA, PCR NEGATIVE NEGATIVE Final   Staphylococcus aureus NEGATIVE NEGATIVE Final    Comment: (NOTE) The Xpert SA Assay (FDA approved for NASAL specimens in patients 23 years of age and older), is one component of a  comprehensive surveillance program. It is not intended to diagnose infection nor to guide or monitor treatment. Performed at [facility], [address] [place], [place] [address]   Culture, respiratory (non-expectorated)     Status: None   Collection Time: 02/25/19 12:15 PM  Result Value Ref Range Status   Specimen Description TRACHEAL ASPIRATE  Final   Special Requests NONE  Final   Gram Stain NO WBC SEEN NO ORGANISMS SEEN   Final   Culture   Final    FEW HAEMOPHILUS INFLUENZAE BETA LACTAMASE NEGATIVE Performed at [facility], [address] [place], [place] [address]    Report Status 02/26/2019 FINAL  Final  Culture, blood (Routine X 2) w Reflex to ID Panel     Status: None   Collection Time: 02/25/19  2:36 PM  Result Value Ref Range Status   Specimen Description BLOOD RIGHT WRIST  Final   Special Requests   Final    AEROBIC BOTTLE ONLY Blood Culture results may not be optimal due to an inadequate  volume of blood received in culture bottles   Culture   Final    NO GROWTH 5 DAYS Performed at [facility], [address], [place], [place] [address]    Report Status 03/02/2019 FINAL  Final  Culture, blood (Routine X 2) w Reflex to ID Panel     Status: None   Collection Time: 02/25/19  4:40 PM  Result Value Ref Range Status   Specimen Description BLOOD RIGHT HAND  Final   Special Requests AEROBIC BOTTLE ONLY Blood Culture adequate volume  Final   Culture   Final    NO GROWTH 5 DAYS Performed at [facility], [address] [place], [place] [address]    Report Status 03/02/2019 FINAL  Final    Anti-infectives:  Anti-infectives (From admission, onward)   Start     Dose/Rate Route Frequency Ordered Stop   02/27/19 1000  Ampicillin-Sulbactam (UNASYN) 3 g in sodium chloride 0.9 % 100 mL IVPB     3 g 200 mL/hr over 30 Minutes Intravenous Every 6 hours 02/27/19 0956     02/25/19 1800  vancomycin (VANCOCIN) 2,000 mg in sodium chloride 0.9 % 500 mL  IVPB  Status:  Discontinued     2,000 mg 250 mL/hr over 120 Minutes Intravenous Every 12 hours 02/25/19 1723 02/27/19 0956   02/25/19 1800  piperacillin-tazobactam (ZOSYN) IVPB 3.375 g  Status:  Discontinued     3.375 g 12.5 mL/hr over 240 Minutes Intravenous Every 8 hours 02/25/19 1723 02/27/19 0956      Best Practice/Protocols:  VTE Prophylaxis: Lovenox (prophylaxtic dose) Continous Sedation  Subjective:    Overnight Issues:   Objective:  Vital signs for last 24 hours: Temp:  [98.1 F (36.7 C)-100.2 F (37.9 C)] 98.8 F (37.1 C) (04/30 0700) Pulse Rate:  [72-103] 77 (04/30 0700) Resp:  [13-18] 15 (04/30 0700) BP: (102-139)/(51-78) 117/63 (04/30 0700) SpO2:  [100 %] 100 % (04/30 0700) FiO2 (%):  [40 %] 40 % (04/30 0808) Weight:  [103 kg] 103 kg (04/30 0500)  Hemodynamic parameters for last 24 hours:    Intake/Output from previous day: 04/29 0701 - 04/30 0700 In: 5654.1 [I.V.:3712.6; CW/CB:7628.3; IV Piggyback:300] Out: 5500 [Urine:5500]  Intake/Output this shift: No intake/output data recorded.  Vent settings for last 24 hours: Vent Mode: CPAP;PSV FiO2 (%):  [40 %] 40 % Set Rate:  [16 bmp] 16 bmp Vt Set:  [620 mL] 620 mL PEEP:  [5 cmH20] 5 cmH20 Pressure Support:  [8 cmH20-10 cmH20] 8 cmH20 Plateau Pressure:  [15 cmH20-19 cmH20] 19 cmH20  Physical Exam:  General: on vent Neuro: sedated but arouses and F/C HEENT/Neck: ETT and ETT advanced 2cm Resp: few rhonchi CVS: RRR GI: soft, wound opening inferiorly is clean Extremities: edema 1+  Results for orders placed or performed during the hospital encounter of 02/18/19 (from the past 24 hour(s))  Glucose, capillary     Status: Abnormal   Collection Time: 03/01/19 12:00 PM  Result Value Ref Range   Glucose-Capillary 115 (H) 70 - 99 mg/dL   Comment 1 Notify RN    Comment 2 Document in Chart   Glucose, capillary     Status: Abnormal   Collection Time: 03/01/19  3:36 PM  Result Value Ref Range    Glucose-Capillary 127 (H) 70 - 99 mg/dL   Comment 1 Notify RN    Comment 2 Document in Chart   Glucose, capillary     Status: Abnormal   Collection Time: 03/01/19  7:55  PM  Result Value Ref Range   Glucose-Capillary 123 (H) 70 - 99 mg/dL  Glucose, capillary     Status: Abnormal   Collection Time: 03/01/19 11:34 PM  Result Value Ref Range   Glucose-Capillary 110 (H) 70 - 99 mg/dL  Glucose, capillary     Status: Abnormal   Collection Time: 03/02/19  3:52 AM  Result Value Ref Range   Glucose-Capillary 116 (H) 70 - 99 mg/dL  Glucose, capillary     Status: Abnormal   Collection Time: 03/02/19  7:59 AM  Result Value Ref Range   Glucose-Capillary 115 (H) 70 - 99 mg/dL    Assessment & Plan: Present on Admission: . Liver laceration    LOS: 12 days   Additional comments:I reviewed the patient's new clinical lab test results. Marland Kitchen PHBC  Grade 4 liver laceration - S/P exploratory laparotomy, hepatorraphy, packing of liver 4/19 by Dr. Luisa Hart, S/P ex lap, removal of packs and partial closure 4/20 by Dr. Janee Morn. S/P ex lap and closure 4/22 by Dr. Janee Morn. L rib FX 2-3.  Pain control.   Acute hypoxic ventilator dependent respiratory failure - weaning but on heavy sedation ETOH abuse disorder/acute alcohol withdrawal - Precedex, Ativan, CSW eval later ; max seroquel & klonopin ID - Unasyn 4/7 for H flu PNA C spine - plan to examine once more alert ABL anemia  FEN - tolerating TF,  Check BMET, lasix daily for now VTE - PAS, lovenox Dispo - ICU, weaning, polysubstance withdrawal management  Critical Care Total Time*: 30 Minutes  Violeta Gelinas, MD, MPH, FACS Trauma: [phone] General Surgery: [phone]  03/02/2019  *Care during the described time interval was provided by me. I have reviewed this patient's available data, including medical history, events of note, physical examination and test results as part of my evaluation.

## 2019-03-03 ENCOUNTER — Inpatient Hospital Stay (HOSPITAL_COMMUNITY): Payer: Medicaid Other

## 2019-03-03 LAB — GLUCOSE, CAPILLARY
Glucose-Capillary: 112 mg/dL — ABNORMAL HIGH (ref 70–99)
Glucose-Capillary: 134 mg/dL — ABNORMAL HIGH (ref 70–99)
Glucose-Capillary: 126 mg/dL — ABNORMAL HIGH (ref 70–99)
Glucose-Capillary: 114 mg/dL — ABNORMAL HIGH (ref 70–99)
Glucose-Capillary: 120 mg/dL — ABNORMAL HIGH (ref 70–99)

## 2019-03-03 LAB — CBC
HCT: 29.5 % — ABNORMAL LOW (ref 39.0–52.0)
Hemoglobin: 9.2 g/dL — ABNORMAL LOW (ref 13.0–17.0)
MCH: 30.7 pg (ref 26.0–34.0)
MCHC: 31.2 g/dL (ref 30.0–36.0)
MCV: 98.3 fL (ref 80.0–100.0)
Platelets: 516 10*3/uL — ABNORMAL HIGH (ref 150–400)
RBC: 3 MIL/uL — ABNORMAL LOW (ref 4.22–5.81)
RDW: 13.6 % (ref 11.5–15.5)
WBC: 9.6 10*3/uL (ref 4.0–10.5)
nRBC: 0 % (ref 0.0–0.2)

## 2019-03-03 LAB — BASIC METABOLIC PANEL
Anion gap: 7 (ref 5–15)
BUN: 13 mg/dL (ref 6–20)
CO2: 28 mmol/L (ref 22–32)
Calcium: 8.2 mg/dL — ABNORMAL LOW (ref 8.9–10.3)
Chloride: 102 mmol/L (ref 98–111)
Creatinine, Ser: 0.71 mg/dL (ref 0.61–1.24)
GFR calc Af Amer: >60 mL/min (ref >60)
GFR calc non Af Amer: >60 mL/min (ref >60)
Glucose, Bld: 146 mg/dL — ABNORMAL HIGH (ref 70–99)
Potassium: 3.9 mmol/L (ref 3.5–5.1)
Sodium: 137 mmol/L (ref 135–145)

## 2019-03-03 MED ORDER — FOLIC ACID 1 MG PO TABS
1.0000 mg | ORAL_TABLET | Freq: Every day | ORAL | Status: DC
  Administered 2019-03-03 – 2019-03-11 (×9): 1 mg
  Filled 2019-03-03 (×9): qty 1

## 2019-03-03 MED ORDER — OXYCODONE HCL 5 MG/5ML PO SOLN
10.0000 mg | ORAL | Status: DC
  Administered 2019-03-03 – 2019-03-10 (×43): 10 mg
  Filled 2019-03-03 (×43): qty 10

## 2019-03-03 MED ORDER — CHLORHEXIDINE GLUCONATE CLOTH 2 % EX PADS
6.0000 | MEDICATED_PAD | Freq: Every day | CUTANEOUS | Status: DC
  Administered 2019-03-04 – 2019-03-06 (×4): 6 via TOPICAL

## 2019-03-03 MED ORDER — VITAMIN B-1 100 MG PO TABS
100.0000 mg | ORAL_TABLET | Freq: Every day | ORAL | Status: DC
  Administered 2019-03-03 – 2019-03-10 (×8): 100 mg
  Filled 2019-03-03 (×8): qty 1

## 2019-03-03 MED ORDER — CLONAZEPAM 1 MG PO TABS
2.0000 mg | ORAL_TABLET | ORAL | Status: DC
  Administered 2019-03-03 – 2019-03-10 (×43): 2 mg
  Filled 2019-03-03 (×43): qty 2

## 2019-03-03 NOTE — Progress Notes (Signed)
Follow up - Trauma and Critical Care  Patient Details:    Jeremy [name] is an 23 y.o. male.  Lines/tubes : Airway 8 mm (Active)  Secured at (cm) 28 cm 03/03/2019  3:40 AM  Measured From Lips 03/03/2019  3:40 AM  Secured Location Right 03/03/2019  3:40 AM  Secured By [employer] 03/03/2019  3:40 AM  Tube Holder Repositioned Yes 03/03/2019  3:40 AM  Cuff Pressure (cm H2O) 28 cm H2O 03/02/2019  7:39 PM  Site Condition Dry 03/03/2019  3:40 AM     PICC Triple Lumen 02/22/19 PICC Left Brachial 48 cm 0 cm (Active)  Indication for Insertion or Continuance of Line Vasoactive infusions 03/03/2019  7:46 AM  Exposed Catheter (cm) 0 cm 02/22/2019  2:31 PM  Site Assessment Clean;Dry;Intact 03/02/2019  8:00 PM  Lumen #1 Status Infusing;Flushed 03/02/2019  8:00 PM  Lumen #2 Status Infusing;Flushed 03/02/2019  8:00 PM  Lumen #3 Status In-line blood sampling system in place 03/02/2019  8:00 PM  Dressing Type Transparent;Occlusive 03/02/2019  8:00 PM  Dressing Status Clean;Dry;Intact;Antimicrobial disc in place 03/02/2019  8:00 PM  Line Care Connections checked and tightened 03/02/2019  8:00 PM  Dressing Intervention Dressing changed;Antimicrobial disc changed 03/01/2019  6:00 AM  Dressing Change Due 03/08/19 03/02/2019  8:00 PM     Rectal Tube/Pouch (Active)  Output (mL) 100 mL 03/01/2019  6:00 AM     External Urinary Catheter (Active)  Collection Container Standard drainage bag 03/02/2019  8:00 PM  Intervention Equipment Changed 02/27/2019  6:58 PM  Output (mL) 300 mL 03/03/2019  7:00 AM    Microbiology/Sepsis markers: Results for orders placed or performed during the hospital encounter of 02/18/19  MRSA PCR Screening     Status: None   Collection Time: 02/19/19  2:02 AM  Result Value Ref Range Status   MRSA by PCR NEGATIVE NEGATIVE Final    Comment:        The GeneXpert MRSA Assay (FDA approved for NASAL specimens only), is one component of a comprehensive MRSA colonization surveillance program. It is  not intended to diagnose MRSA infection nor to guide or monitor treatment for MRSA infections. Performed at [facility], [address], [place], [place] [address]   SARS Coronavirus 2 [facility] order, Performed in [facility] hospital lab)     Status: None   Collection Time: 02/19/19  3:20 AM  Result Value Ref Range Status   SARS Coronavirus 2 NEGATIVE NEGATIVE Final    Comment: (NOTE) If result is NEGATIVE SARS-CoV-2 target nucleic acids are NOT DETECTED. The SARS-CoV-2 RNA is generally detectable in upper and lower  respiratory specimens during the acute phase of infection. The lowest  concentration of SARS-CoV-2 viral copies this assay can detect is 250  copies / mL. A negative result does not preclude SARS-CoV-2 infection  and should not be used as the sole basis for treatment or other  patient management decisions.  A negative result may occur with  improper specimen collection / handling, submission of specimen other  than nasopharyngeal swab, presence of viral mutation(s) within the  areas targeted by this assay, and inadequate number of viral copies  (<250 copies / mL). A negative result must be combined with clinical  observations, patient history, and epidemiological information. If result is POSITIVE SARS-CoV-2 target nucleic acids are DETECTED. The SARS-CoV-2 RNA is generally detectable in upper and lower  respiratory specimens dur ing the acute phase of infection.  Positive  results are indicative of  active infection with SARS-CoV-2.  Clinical  correlation with patient history and other diagnostic information is  necessary to determine patient infection status.  Positive results do  not rule out bacterial infection or co-infection with other viruses. If result is PRESUMPTIVE POSTIVE SARS-CoV-2 nucleic acids MAY BE PRESENT.   A presumptive positive result was obtained on the submitted specimen  and confirmed on repeat testing.  While 2019 novel coronavirus   (SARS-CoV-2) nucleic acids may be present in the submitted sample  additional confirmatory testing may be necessary for epidemiological  and / or clinical management purposes  to differentiate between  SARS-CoV-2 and other Sarbecovirus currently known to infect humans.  If clinically indicated additional testing with an alternate test  methodology [phone]) is advised. The SARS-CoV-2 RNA is generally  detectable in upper and lower respiratory sp ecimens during the acute  phase of infection. The expected result is Negative. Fact Sheet for Patients:  BoilerBrush.com.cy Fact Sheet for Healthcare Providers: https://pope.com/ This test is not yet approved or cleared by the [place] FDA and has been authorized for detection and/or diagnosis of SARS-CoV-2 by FDA under an Emergency Use Authorization (EUA).  This EUA will remain in effect (meaning this test can be used) for the duration of the COVID-19 declaration under Section 564(b)(1) of the Act, 21 U.S.C. section 360bbb-3(b)(1), unless the authorization is terminated or revoked sooner. Performed at [facility], [address], [place], [place] [address]   Respiratory Panel by PCR     Status: None   Collection Time: 02/19/19  3:20 AM  Result Value Ref Range Status   Adenovirus NOT DETECTED NOT DETECTED Final   Coronavirus 229E NOT DETECTED NOT DETECTED Final    Comment: (NOTE) The Coronavirus on the Respiratory Panel, DOES NOT test for the novel  Coronavirus (2019 nCoV)    Coronavirus HKU1 NOT DETECTED NOT DETECTED Final   Coronavirus NL63 NOT DETECTED NOT DETECTED Final   Coronavirus OC43 NOT DETECTED NOT DETECTED Final   Metapneumovirus NOT DETECTED NOT DETECTED Final   Rhinovirus / Enterovirus NOT DETECTED NOT DETECTED Final   Influenza A NOT DETECTED NOT DETECTED Final   Influenza B NOT DETECTED NOT DETECTED Final   Parainfluenza Virus 1 NOT DETECTED NOT DETECTED Final    Parainfluenza Virus 2 NOT DETECTED NOT DETECTED Final   Parainfluenza Virus 3 NOT DETECTED NOT DETECTED Final   Parainfluenza Virus 4 NOT DETECTED NOT DETECTED Final   Respiratory Syncytial Virus NOT DETECTED NOT DETECTED Final   Bordetella pertussis NOT DETECTED NOT DETECTED Final   Chlamydophila pneumoniae NOT DETECTED NOT DETECTED Final   Mycoplasma pneumoniae NOT DETECTED NOT DETECTED Final    Comment: Performed at [facility], [address], [place], [place] [address]  SARS Coronavirus 2 [facility] order, Performed in [facility] hospital lab)     Status: None   Collection Time: 02/19/19  6:33 PM  Result Value Ref Range Status   SARS Coronavirus 2 NEGATIVE NEGATIVE Final    Comment: (NOTE) If result is NEGATIVE SARS-CoV-2 target nucleic acids are NOT DETECTED. The SARS-CoV-2 RNA is generally detectable in upper and lower  respiratory specimens during the acute phase of infection. The lowest  concentration of SARS-CoV-2 viral copies this assay can detect is 250  copies / mL. A negative result does not preclude SARS-CoV-2 infection  and should not be used as the sole basis for treatment or other  patient management decisions.  A negative result may occur with  improper  specimen collection / handling, submission of specimen other  than nasopharyngeal swab, presence of viral mutation(s) within the  areas targeted by this assay, and inadequate number of viral copies  (<250 copies / mL). A negative result must be combined with clinical  observations, patient history, and epidemiological information. If result is POSITIVE SARS-CoV-2 target nucleic acids are DETECTED. The SARS-CoV-2 RNA is generally detectable in upper and lower  respiratory specimens dur ing the acute phase of infection.  Positive  results are indicative of active infection with SARS-CoV-2.  Clinical  correlation with patient history and other diagnostic information is  necessary to determine patient  infection status.  Positive results do  not rule out bacterial infection or co-infection with other viruses. If result is PRESUMPTIVE POSTIVE SARS-CoV-2 nucleic acids MAY BE PRESENT.   A presumptive positive result was obtained on the submitted specimen  and confirmed on repeat testing.  While 2019 novel coronavirus  (SARS-CoV-2) nucleic acids may be present in the submitted sample  additional confirmatory testing may be necessary for epidemiological  and / or clinical management purposes  to differentiate between  SARS-CoV-2 and other Sarbecovirus currently known to infect humans.  If clinically indicated additional testing with an alternate test  methodology [phone]([id number]) is advised. The SARS-CoV-2 RNA is generally  detectable in upper and lower respiratory sp ecimens during the acute  phase of infection. The expected result is Negative. Fact Sheet for Patients:  BoilerBrush.com.cyhttps://www.fda.gov/media/136312/download Fact Sheet for Healthcare Providers: https://pope.com/https://www.fda.gov/media/136313/download This test is not yet approved or cleared by the [place] FDA and has been authorized for detection and/or diagnosis of SARS-CoV-2 by FDA under an Emergency Use Authorization (EUA).  This EUA will remain in effect (meaning this test can be used) for the duration of the COVID-19 declaration under Section 564(b)(1) of the Act, 21 U.S.C. section 360bbb-3(b)(1), unless the authorization is terminated or revoked sooner. Performed at [facility], [address] [place], [place] [address]   Surgical PCR screen     Status: None   Collection Time: 02/22/19  7:36 AM  Result Value Ref Range Status   MRSA, PCR NEGATIVE NEGATIVE Final   Staphylococcus aureus NEGATIVE NEGATIVE Final    Comment: (NOTE) The Xpert SA Assay (FDA approved for NASAL specimens in patients 23 years of age and older), is one component of a comprehensive surveillance program. It is not intended to diagnose infection nor to guide or  monitor treatment. Performed at [facility], [address] [place], [place] [address]   Culture, respiratory (non-expectorated)     Status: None   Collection Time: 02/25/19 12:15 PM  Result Value Ref Range Status   Specimen Description TRACHEAL ASPIRATE  Final   Special Requests NONE  Final   Gram Stain NO WBC SEEN NO ORGANISMS SEEN   Final   Culture   Final    FEW HAEMOPHILUS INFLUENZAE BETA LACTAMASE NEGATIVE Performed at [facility], [address] [place], [place] [address]    Report Status 02/26/2019 FINAL  Final  Culture, blood (Routine X 2) w Reflex to ID Panel     Status: None   Collection Time: 02/25/19  2:36 PM  Result Value Ref Range Status   Specimen Description BLOOD RIGHT WRIST  Final   Special Requests   Final    AEROBIC BOTTLE ONLY Blood Culture results may not be optimal due to an inadequate volume of blood received in culture bottles   Culture   Final  NO GROWTH 5 DAYS Performed at [facility], [address], [place], [place] [address]    Report Status 03/02/2019 FINAL  Final  Culture, blood (Routine X 2) w Reflex to ID Panel     Status: None   Collection Time: 02/25/19  4:40 PM  Result Value Ref Range Status   Specimen Description BLOOD RIGHT HAND  Final   Special Requests AEROBIC BOTTLE ONLY Blood Culture adequate volume  Final   Culture   Final    NO GROWTH 5 DAYS Performed at [facility], [address] [place], [place] [address]    Report Status 03/02/2019 FINAL  Final    Anti-infectives:  Anti-infectives (From admission, onward)   Start     Dose/Rate Route Frequency Ordered Stop   02/27/19 1000  Ampicillin-Sulbactam (UNASYN) 3 g in sodium chloride 0.9 % 100 mL IVPB     3 g 200 mL/hr over 30 Minutes Intravenous Every 6 hours 02/27/19 0956     02/25/19 1800  vancomycin (VANCOCIN) 2,000 mg in sodium chloride 0.9 % 500 mL IVPB  Status:  Discontinued     2,000 mg 250 mL/hr over 120 Minutes Intravenous Every 12  hours 02/25/19 1723 02/27/19 0956   02/25/19 1800  piperacillin-tazobactam (ZOSYN) IVPB 3.375 g  Status:  Discontinued     3.375 g 12.5 mL/hr over 240 Minutes Intravenous Every 8 hours 02/25/19 1723 02/27/19 0956      Best Practice/Protocols:  VTE Prophylaxis: Lovenox (prophylaxtic dose) Continous Sedation  Consults:     Events:  Chief Complaint/Subjective:    Overnight Issues: Continued agitation issues, increased oral anxiolytics, continues to require high levels of drip sedation, weaned well yesterday  Objective:  Vital signs for last 24 hours: Temp:  [98.8 F (37.1 C)-100.4 F (38 C)] 99.9 F (37.7 C) (05/01 0800) Pulse Rate:  [10-121] 78 (05/01 0800) Resp:  [14-26] 16 (05/01 0800) BP: (96-144)/(47-72) 107/63 (05/01 0800) SpO2:  [95 %-100 %] 100 % (05/01 0800) FiO2 (%):  [40 %] 40 % (05/01 0340) Weight:  [102.8 kg] 102.8 kg (05/01 0500)  Hemodynamic parameters for last 24 hours:    Intake/Output from previous day: 04/30 0701 - 05/01 0700 In: 5664.9 [I.V.:3609.7; NG/GT:1680; IV Piggyback:375.3] Out: 6250 [Urine:6250]  Intake/Output this shift: Total I/O In: 228 [I.V.:158; NG/GT:70] Out: -   Vent settings for last 24 hours: Vent Mode: PRVC FiO2 (%):  [40 %] 40 % Set Rate:  [16 bmp] 16 bmp Vt Set:  [620 mL] 620 mL PEEP:  [5 cmH20] 5 cmH20 Pressure Support:  [8 cmH20] 8 cmH20 Plateau Pressure:  [17 cmH20-19 cmH20] 19 cmH20  Physical Exam:  Gen: intubated, sedated HEENT: collar in place, ETT in place, feeding tube in place Resp: assisted, clear Cardiovascular: RRR Abdomen: soft, incision clean and dry, base open with clean base Ext: no edema Neuro: GCS 6t  Results for orders placed or performed during the hospital encounter of 02/18/19 (from the past 24 hour(s))  Basic metabolic panel     Status: Abnormal   Collection Time: 03/02/19  8:23 AM  Result Value Ref Range   Sodium 139 135 - 145 mmol/L   Potassium 3.8 3.5 - 5.1 mmol/L   Chloride 105 98 -  111 mmol/L   CO2 29 22 - 32 mmol/L   Glucose, Bld 133 (H) 70 - 99 mg/dL   BUN 10 6 - 20 mg/dL   Creatinine, Ser 0.98 0.61 - 1.24 mg/dL   Calcium 8.1 (L) 8.9 -  10.3 mg/dL   GFR calc non Af Amer >60 >60 mL/min   GFR calc Af Amer >60 >60 mL/min   Anion gap 5 5 - 15  Glucose, capillary     Status: Abnormal   Collection Time: 03/02/19 11:54 AM  Result Value Ref Range   Glucose-Capillary 100 (H) 70 - 99 mg/dL  Glucose, capillary     Status: Abnormal   Collection Time: 03/02/19  3:42 PM  Result Value Ref Range   Glucose-Capillary 116 (H) 70 - 99 mg/dL  Glucose, capillary     Status: Abnormal   Collection Time: 03/02/19  7:31 PM  Result Value Ref Range   Glucose-Capillary 111 (H) 70 - 99 mg/dL  Glucose, capillary     Status: Abnormal   Collection Time: 03/02/19 11:19 PM  Result Value Ref Range   Glucose-Capillary 110 (H) 70 - 99 mg/dL  Glucose, capillary     Status: Abnormal   Collection Time: 03/03/19  3:19 AM  Result Value Ref Range   Glucose-Capillary 112 (H) 70 - 99 mg/dL  CBC     Status: Abnormal   Collection Time: 03/03/19  5:00 AM  Result Value Ref Range   WBC 9.6 4.0 - 10.5 K/uL   RBC 3.00 (L) 4.22 - 5.81 MIL/uL   Hemoglobin 9.2 (L) 13.0 - 17.0 g/dL   HCT 13.0 (L) 86.5 - 78.4 %   MCV 98.3 80.0 - 100.0 fL   MCH 30.7 26.0 - 34.0 pg   MCHC 31.2 30.0 - 36.0 g/dL   RDW 69.6 29.5 - 28.4 %   Platelets 516 (H) 150 - 400 K/uL   nRBC 0.0 0.0 - 0.2 %  Basic metabolic panel     Status: Abnormal   Collection Time: 03/03/19  5:00 AM  Result Value Ref Range   Sodium 137 135 - 145 mmol/L   Potassium 3.9 3.5 - 5.1 mmol/L   Chloride 102 98 - 111 mmol/L   CO2 28 22 - 32 mmol/L   Glucose, Bld 146 (H) 70 - 99 mg/dL   BUN 13 6 - 20 mg/dL   Creatinine, Ser 1.32 0.61 - 1.24 mg/dL   Calcium 8.2 (L) 8.9 - 10.3 mg/dL   GFR calc non Af Amer >60 >60 mL/min   GFR calc Af Amer >60 >60 mL/min   Anion gap 7 5 - 15  Glucose, capillary     Status: Abnormal   Collection Time: 03/03/19  7:45 AM   Result Value Ref Range   Glucose-Capillary 134 (H) 70 - 99 mg/dL   Comment 1 Notify RN    Comment 2 Document in Chart      Assessment/Plan:   PHBC Grade 4 liver laceration -S/P exploratory laparotomy, hepatorraphy, packing of liver 4/19 by Dr. Luisa Hart, S/P ex lap, removal of packs and partial closure 4/20 by Dr. Janee Morn. S/P ex lap and closure 4/22 by Dr. Janee Morn. L rib FX 2-3. Pain control.  Acute hypoxic ventilator dependent respiratory failure- weaning but on heavy sedation ETOH abuse disorder/acute alcohol withdrawal- Precedex, Ativan, CSW eval later ; max seroquel &klonopin ID- Unasyn 4/7 for H flu PNA C spine- plan to examine once more alert ABL anemia FEN- tolerating TF, creatinine stable, daily lasix VTE- PAS, lovenox Dispo- ICU, weaning, polysubstance withdrawal management   LOS: 13 days   Additional comments:I reviewed the patient's new clinical lab test results. Cr stable, Hgb stable at 9.2, no leukocytosis  Critical Care Total Time*:  De Blanch Kinsinger 03/03/2019  *Care  during the described time interval was provided by me and/or other providers on the critical care team.  I have reviewed this patient's available data, including medical history, events of note, physical examination and test results as part of my evaluation.

## 2019-03-03 NOTE — Plan of Care (Signed)
Pt tolerating tube feed well at this time

## 2019-03-03 NOTE — Progress Notes (Signed)
Pt placed back to full support on documented settings.

## 2019-03-04 LAB — BASIC METABOLIC PANEL
Anion gap: 8 (ref 5–15)
BUN: 14 mg/dL (ref 6–20)
CO2: 26 mmol/L (ref 22–32)
Calcium: 8.5 mg/dL — ABNORMAL LOW (ref 8.9–10.3)
Chloride: 104 mmol/L (ref 98–111)
Creatinine, Ser: 0.68 mg/dL (ref 0.61–1.24)
GFR calc Af Amer: >60 mL/min (ref >60)
GFR calc non Af Amer: >60 mL/min (ref >60)
Glucose, Bld: 127 mg/dL — ABNORMAL HIGH (ref 70–99)
Potassium: 4.1 mmol/L (ref 3.5–5.1)
Sodium: 138 mmol/L (ref 135–145)

## 2019-03-04 LAB — CBC
HCT: 30.2 % — ABNORMAL LOW (ref 39.0–52.0)
Hemoglobin: 9.4 g/dL — ABNORMAL LOW (ref 13.0–17.0)
MCH: 30.7 pg (ref 26.0–34.0)
MCHC: 31.1 g/dL (ref 30.0–36.0)
MCV: 98.7 fL (ref 80.0–100.0)
Platelets: 573 10*3/uL — ABNORMAL HIGH (ref 150–400)
RBC: 3.06 MIL/uL — ABNORMAL LOW (ref 4.22–5.81)
RDW: 13.8 % (ref 11.5–15.5)
WBC: 8 10*3/uL (ref 4.0–10.5)
nRBC: 0 % (ref 0.0–0.2)

## 2019-03-04 LAB — GLUCOSE, CAPILLARY
Glucose-Capillary: 100 mg/dL — ABNORMAL HIGH (ref 70–99)
Glucose-Capillary: 108 mg/dL — ABNORMAL HIGH (ref 70–99)
Glucose-Capillary: 116 mg/dL — ABNORMAL HIGH (ref 70–99)
Glucose-Capillary: 116 mg/dL — ABNORMAL HIGH (ref 70–99)
Glucose-Capillary: 126 mg/dL — ABNORMAL HIGH (ref 70–99)
Glucose-Capillary: 149 mg/dL — ABNORMAL HIGH (ref 70–99)
Glucose-Capillary: 97 mg/dL (ref 70–99)

## 2019-03-04 NOTE — Progress Notes (Signed)
Patient withdraws to painful stimuli. Afebrile. Vent mode: PRVC. O2 SAT 99% Continues on Fentanyl, Versed, and Precedex IV drips (titrated accordingly). On Unasyn therapy with no adverse reactions. Vertical staples to abdomen remain intact. Inferior abdominal region: wet to dry dressing is clean, dry, and intact. External catheter draining clear yellow urine (see flow sheet for output). Rectal tube remains intact (see flow sheet for output).

## 2019-03-04 NOTE — Progress Notes (Signed)
Follow up - Trauma and Critical Care  Patient Details:    Jeremy [name] is an 23 y.o. male.  Lines/tubes : Airway 8 mm (Active)  Secured at (cm) 28 cm 03/03/2019  3:40 AM  Measured From Lips 03/03/2019  3:40 AM  Secured Location Right 03/03/2019  3:40 AM  Secured By [employer] 03/03/2019  3:40 AM  Tube Holder Repositioned Yes 03/03/2019  3:40 AM  Cuff Pressure (cm H2O) 28 cm H2O 03/02/2019  7:39 PM  Site Condition Dry 03/03/2019  3:40 AM     PICC Triple Lumen 02/22/19 PICC Left Brachial 48 cm 0 cm (Active)  Indication for Insertion or Continuance of Line Vasoactive infusions 03/03/2019  7:46 AM  Exposed Catheter (cm) 0 cm 02/22/2019  2:31 PM  Site Assessment Clean;Dry;Intact 03/02/2019  8:00 PM  Lumen #1 Status Infusing;Flushed 03/02/2019  8:00 PM  Lumen #2 Status Infusing;Flushed 03/02/2019  8:00 PM  Lumen #3 Status In-line blood sampling system in place 03/02/2019  8:00 PM  Dressing Type Transparent;Occlusive 03/02/2019  8:00 PM  Dressing Status Clean;Dry;Intact;Antimicrobial disc in place 03/02/2019  8:00 PM  Line Care Connections checked and tightened 03/02/2019  8:00 PM  Dressing Intervention Dressing changed;Antimicrobial disc changed 03/01/2019  6:00 AM  Dressing Change Due 03/08/19 03/02/2019  8:00 PM     Rectal Tube/Pouch (Active)  Output (mL) 100 mL 03/01/2019  6:00 AM     External Urinary Catheter (Active)  Collection Container Standard drainage bag 03/02/2019  8:00 PM  Intervention Equipment Changed 02/27/2019  6:58 PM  Output (mL) 300 mL 03/03/2019  7:00 AM    Microbiology/Sepsis markers: Results for orders placed or performed during the hospital encounter of 02/18/19  MRSA PCR Screening     Status: None   Collection Time: 02/19/19  2:02 AM  Result Value Ref Range Status   MRSA by PCR NEGATIVE NEGATIVE Final    Comment:        The GeneXpert MRSA Assay (FDA approved for NASAL specimens only), is one component of a comprehensive MRSA colonization surveillance program. It is  not intended to diagnose MRSA infection nor to guide or monitor treatment for MRSA infections. Performed at [facility], [address], [place], [place] [address]   SARS Coronavirus 2 [facility] order, Performed in [facility] hospital lab)     Status: None   Collection Time: 02/19/19  3:20 AM  Result Value Ref Range Status   SARS Coronavirus 2 NEGATIVE NEGATIVE Final    Comment: (NOTE) If result is NEGATIVE SARS-CoV-2 target nucleic acids are NOT DETECTED. The SARS-CoV-2 RNA is generally detectable in upper and lower  respiratory specimens during the acute phase of infection. The lowest  concentration of SARS-CoV-2 viral copies this assay can detect is 250  copies / mL. A negative result does not preclude SARS-CoV-2 infection  and should not be used as the sole basis for treatment or other  patient management decisions.  A negative result may occur with  improper specimen collection / handling, submission of specimen other  than nasopharyngeal swab, presence of viral mutation(s) within the  areas targeted by this assay, and inadequate number of viral copies  (<250 copies / mL). A negative result must be combined with clinical  observations, patient history, and epidemiological information. If result is POSITIVE SARS-CoV-2 target nucleic acids are DETECTED. The SARS-CoV-2 RNA is generally detectable in upper and lower  respiratory specimens dur ing the acute phase of infection.  Positive  results are indicative of  active infection with SARS-CoV-2.  Clinical  correlation with patient history and other diagnostic information is  necessary to determine patient infection status.  Positive results do  not rule out bacterial infection or co-infection with other viruses. If result is PRESUMPTIVE POSTIVE SARS-CoV-2 nucleic acids MAY BE PRESENT.   A presumptive positive result was obtained on the submitted specimen  and confirmed on repeat testing.  While 2019 novel coronavirus   (SARS-CoV-2) nucleic acids may be present in the submitted sample  additional confirmatory testing may be necessary for epidemiological  and / or clinical management purposes  to differentiate between  SARS-CoV-2 and other Sarbecovirus currently known to infect humans.  If clinically indicated additional testing with an alternate test  methodology [phone]) is advised. The SARS-CoV-2 RNA is generally  detectable in upper and lower respiratory sp ecimens during the acute  phase of infection. The expected result is Negative. Fact Sheet for Patients:  BoilerBrush.com.cy Fact Sheet for Healthcare Providers: https://pope.com/ This test is not yet approved or cleared by the [place] FDA and has been authorized for detection and/or diagnosis of SARS-CoV-2 by FDA under an Emergency Use Authorization (EUA).  This EUA will remain in effect (meaning this test can be used) for the duration of the COVID-19 declaration under Section 564(b)(1) of the Act, 21 U.S.C. section 360bbb-3(b)(1), unless the authorization is terminated or revoked sooner. Performed at [facility], [address], [place], [place] [address]   Respiratory Panel by PCR     Status: None   Collection Time: 02/19/19  3:20 AM  Result Value Ref Range Status   Adenovirus NOT DETECTED NOT DETECTED Final   Coronavirus 229E NOT DETECTED NOT DETECTED Final    Comment: (NOTE) The Coronavirus on the Respiratory Panel, DOES NOT test for the novel  Coronavirus (2019 nCoV)    Coronavirus HKU1 NOT DETECTED NOT DETECTED Final   Coronavirus NL63 NOT DETECTED NOT DETECTED Final   Coronavirus OC43 NOT DETECTED NOT DETECTED Final   Metapneumovirus NOT DETECTED NOT DETECTED Final   Rhinovirus / Enterovirus NOT DETECTED NOT DETECTED Final   Influenza A NOT DETECTED NOT DETECTED Final   Influenza B NOT DETECTED NOT DETECTED Final   Parainfluenza Virus 1 NOT DETECTED NOT DETECTED Final    Parainfluenza Virus 2 NOT DETECTED NOT DETECTED Final   Parainfluenza Virus 3 NOT DETECTED NOT DETECTED Final   Parainfluenza Virus 4 NOT DETECTED NOT DETECTED Final   Respiratory Syncytial Virus NOT DETECTED NOT DETECTED Final   Bordetella pertussis NOT DETECTED NOT DETECTED Final   Chlamydophila pneumoniae NOT DETECTED NOT DETECTED Final   Mycoplasma pneumoniae NOT DETECTED NOT DETECTED Final    Comment: Performed at [facility], [address], [place], [place] [address]  SARS Coronavirus 2 [facility] order, Performed in [facility] hospital lab)     Status: None   Collection Time: 02/19/19  6:33 PM  Result Value Ref Range Status   SARS Coronavirus 2 NEGATIVE NEGATIVE Final    Comment: (NOTE) If result is NEGATIVE SARS-CoV-2 target nucleic acids are NOT DETECTED. The SARS-CoV-2 RNA is generally detectable in upper and lower  respiratory specimens during the acute phase of infection. The lowest  concentration of SARS-CoV-2 viral copies this assay can detect is 250  copies / mL. A negative result does not preclude SARS-CoV-2 infection  and should not be used as the sole basis for treatment or other  patient management decisions.  A negative result may occur with  improper  specimen collection / handling, submission of specimen other  than nasopharyngeal swab, presence of viral mutation(s) within the  areas targeted by this assay, and inadequate number of viral copies  (<250 copies / mL). A negative result must be combined with clinical  observations, patient history, and epidemiological information. If result is POSITIVE SARS-CoV-2 target nucleic acids are DETECTED. The SARS-CoV-2 RNA is generally detectable in upper and lower  respiratory specimens dur ing the acute phase of infection.  Positive  results are indicative of active infection with SARS-CoV-2.  Clinical  correlation with patient history and other diagnostic information is  necessary to determine patient  infection status.  Positive results do  not rule out bacterial infection or co-infection with other viruses. If result is PRESUMPTIVE POSTIVE SARS-CoV-2 nucleic acids MAY BE PRESENT.   A presumptive positive result was obtained on the submitted specimen  and confirmed on repeat testing.  While 2019 novel coronavirus  (SARS-CoV-2) nucleic acids may be present in the submitted sample  additional confirmatory testing may be necessary for epidemiological  and / or clinical management purposes  to differentiate between  SARS-CoV-2 and other Sarbecovirus currently known to infect humans.  If clinically indicated additional testing with an alternate test  methodology [phone]([id number]) is advised. The SARS-CoV-2 RNA is generally  detectable in upper and lower respiratory sp ecimens during the acute  phase of infection. The expected result is Negative. Fact Sheet for Patients:  BoilerBrush.com.cyhttps://www.fda.gov/media/136312/download Fact Sheet for Healthcare Providers: https://pope.com/https://www.fda.gov/media/136313/download This test is not yet approved or cleared by the [place] FDA and has been authorized for detection and/or diagnosis of SARS-CoV-2 by FDA under an Emergency Use Authorization (EUA).  This EUA will remain in effect (meaning this test can be used) for the duration of the COVID-19 declaration under Section 564(b)(1) of the Act, 21 U.S.C. section 360bbb-3(b)(1), unless the authorization is terminated or revoked sooner. Performed at [facility], [address] [place], [place] [address]   Surgical PCR screen     Status: None   Collection Time: 02/22/19  7:36 AM  Result Value Ref Range Status   MRSA, PCR NEGATIVE NEGATIVE Final   Staphylococcus aureus NEGATIVE NEGATIVE Final    Comment: (NOTE) The Xpert SA Assay (FDA approved for NASAL specimens in patients 23 years of age and older), is one component of a comprehensive surveillance program. It is not intended to diagnose infection nor to guide or  monitor treatment. Performed at [facility], [address] [place], [place] [address]   Culture, respiratory (non-expectorated)     Status: None   Collection Time: 02/25/19 12:15 PM  Result Value Ref Range Status   Specimen Description TRACHEAL ASPIRATE  Final   Special Requests NONE  Final   Gram Stain NO WBC SEEN NO ORGANISMS SEEN   Final   Culture   Final    FEW HAEMOPHILUS INFLUENZAE BETA LACTAMASE NEGATIVE Performed at [facility], [address] [place], [place] [address]    Report Status 02/26/2019 FINAL  Final  Culture, blood (Routine X 2) w Reflex to ID Panel     Status: None   Collection Time: 02/25/19  2:36 PM  Result Value Ref Range Status   Specimen Description BLOOD RIGHT WRIST  Final   Special Requests   Final    AEROBIC BOTTLE ONLY Blood Culture results may not be optimal due to an inadequate volume of blood received in culture bottles   Culture   Final  NO GROWTH 5 DAYS Performed at [facility], [address] [place], [place] [address]    Report Status 03/02/2019 FINAL  Final  Culture, blood (Routine X 2) w Reflex to ID Panel     Status: None   Collection Time: 02/25/19  4:40 PM  Result Value Ref Range Status   Specimen Description BLOOD RIGHT HAND  Final   Special Requests AEROBIC BOTTLE ONLY Blood Culture adequate volume  Final   Culture   Final    NO GROWTH 5 DAYS Performed at [facility], [address] [place], [place] [address]    Report Status 03/02/2019 FINAL  Final    Anti-infectives:  Anti-infectives (From admission, onward)   Start     Dose/Rate Route Frequency Ordered Stop   02/27/19 1000  Ampicillin-Sulbactam (UNASYN) 3 g in sodium chloride 0.9 % 100 mL IVPB     3 g 200 mL/hr over 30 Minutes Intravenous Every 6 hours 02/27/19 0956 03/06/19 0359   02/25/19 1800  vancomycin (VANCOCIN) 2,000 mg in sodium chloride 0.9 % 500 mL IVPB  Status:  Discontinued     2,000 mg 250 mL/hr over 120 Minutes Intravenous  Every 12 hours 02/25/19 1723 02/27/19 0956   02/25/19 1800  piperacillin-tazobactam (ZOSYN) IVPB 3.375 g  Status:  Discontinued     3.375 g 12.5 mL/hr over 240 Minutes Intravenous Every 8 hours 02/25/19 1723 02/27/19 0956      Best Practice/Protocols:  VTE Prophylaxis: Lovenox (prophylaxtic dose) Continous Sedation  Consults:     Events:  Chief Complaint/Subjective:    Overnight Issues: Continued agitation issues, increased oral anxiolytics - precidex and BDZ, but continues to require high levels of drip sedation, weaning well  Objective:  Vital signs for last 24 hours: Temp:  [97.2 F (36.2 C)-100 F (37.8 C)] 97.3 F (36.3 C) (05/02 0830) Pulse Rate:  [70-136] 72 (05/02 0830) Resp:  [14-22] 16 (05/02 0830) BP: (102-141)/(47-80) 110/57 (05/02 0830) SpO2:  [95 %-100 %] 100 % (05/02 0830) FiO2 (%):  [40 %] 40 % (05/02 0815) Weight:  [99.3 kg] 99.3 kg (05/02 0500)  Hemodynamic parameters for last 24 hours:    Intake/Output from previous day: 05/01 0701 - 05/02 0700 In: 4406.2 [I.V.:2364.6; NG/GT:1540; IV Piggyback:501.6] Out: 4795 [Urine:4795]  Intake/Output this shift: Total I/O In: 10 [I.V.:10] Out: 650 [Urine:650]  Vent settings for last 24 hours: Vent Mode: PRVC FiO2 (%):  [40 %] 40 % Set Rate:  [16 bmp] 16 bmp Vt Set:  [191[620 mL] 620 mL PEEP:  [5 cmH20] 5 cmH20 Pressure Support:  [8 cmH20] 8 cmH20 Plateau Pressure:  [18 cmH20-20 cmH20] 20 cmH20  Physical Exam:  Gen: intubated, sedated HEENT: collar in place, ETT in place, feeding tube in place Resp: assisted, clear Cardiovascular: RRR Abdomen: soft, incision clean and dry, base open with clean base - possible fascial opening here but no evisceration or clearly visible bowel - wet to dry replaced Ext: no edema Neuro: GCS 6T  Results for orders placed or performed during the hospital encounter of 02/18/19 (from the past 24 hour(s))  Glucose, capillary     Status: Abnormal   Collection Time: 03/03/19  11:29 AM  Result Value Ref Range   Glucose-Capillary 126 (H) 70 - 99 mg/dL   Comment 1 Notify RN    Comment 2 Document in Chart   Glucose, capillary     Status: Abnormal   Collection Time: 03/03/19  3:48 PM  Result Value Ref Range  Glucose-Capillary 114 (H) 70 - 99 mg/dL   Comment 1 Notify RN    Comment 2 Document in Chart   Glucose, capillary     Status: Abnormal   Collection Time: 03/03/19  7:55 PM  Result Value Ref Range   Glucose-Capillary 120 (H) 70 - 99 mg/dL  Glucose, capillary     Status: Abnormal   Collection Time: 03/04/19 12:18 AM  Result Value Ref Range   Glucose-Capillary 100 (H) 70 - 99 mg/dL  Glucose, capillary     Status: Abnormal   Collection Time: 03/04/19  4:15 AM  Result Value Ref Range   Glucose-Capillary 116 (H) 70 - 99 mg/dL  CBC     Status: Abnormal   Collection Time: 03/04/19  6:21 AM  Result Value Ref Range   WBC 8.0 4.0 - 10.5 K/uL   RBC 3.06 (L) 4.22 - 5.81 MIL/uL   Hemoglobin 9.4 (L) 13.0 - 17.0 g/dL   HCT 16.1 (L) 09.6 - 04.5 %   MCV 98.7 80.0 - 100.0 fL   MCH 30.7 26.0 - 34.0 pg   MCHC 31.1 30.0 - 36.0 g/dL   RDW 40.9 81.1 - 91.4 %   Platelets 573 (H) 150 - 400 K/uL   nRBC 0.0 0.0 - 0.2 %  Basic metabolic panel     Status: Abnormal   Collection Time: 03/04/19  6:21 AM  Result Value Ref Range   Sodium 138 135 - 145 mmol/L   Potassium 4.1 3.5 - 5.1 mmol/L   Chloride 104 98 - 111 mmol/L   CO2 26 22 - 32 mmol/L   Glucose, Bld 127 (H) 70 - 99 mg/dL   BUN 14 6 - 20 mg/dL   Creatinine, Ser 7.82 0.61 - 1.24 mg/dL   Calcium 8.5 (L) 8.9 - 10.3 mg/dL   GFR calc non Af Amer >60 >60 mL/min   GFR calc Af Amer >60 >60 mL/min   Anion gap 8 5 - 15  Glucose, capillary     Status: Abnormal   Collection Time: 03/04/19  8:09 AM  Result Value Ref Range   Glucose-Capillary 116 (H) 70 - 99 mg/dL   Comment 1 Notify RN    Comment 2 Document in Chart      Assessment/Plan:   PHBC Grade 4 liver laceration -S/P exploratory laparotomy, hepatorraphy,  packing of liver 4/19 by Dr. Luisa Hart, S/P ex lap, removal of packs and partial closure 4/20 by Dr. Janee Morn. S/P ex lap and closure 4/22 by Dr. Janee Morn. L rib FX 2-3. Pain control.  Acute hypoxic ventilator dependent respiratory failure- weaning but on heavy sedation ETOH abuse disorder/acute alcohol withdrawal- Precedex, Ativan, CSW eval; max seroquel &klonopin ID- Unasyn 4/7 for H flu PNA C spine- plan to examine once more alert ABL anemia FEN- tolerating TF, creatinine stable, daily lasix VTE- PAS, lovenox Dispo- ICU, weaning, polysubstance withdrawal management   LOS: 14 days   Additional comments:I reviewed the patient's new clinical lab test results. Cr stable, Hgb stable at 9.4, no leukocytosis  Critical Care Total Time*:  Andria Meuse 03/04/2019  *Care during the described time interval was provided by me and/or other providers on the critical care team.  I have reviewed this patient's available data, including medical history, events of note, physical examination and test results as part of my evaluation.

## 2019-03-05 LAB — GLUCOSE, CAPILLARY
Glucose-Capillary: 123 mg/dL — ABNORMAL HIGH (ref 70–99)
Glucose-Capillary: 118 mg/dL — ABNORMAL HIGH (ref 70–99)
Glucose-Capillary: 116 mg/dL — ABNORMAL HIGH (ref 70–99)
Glucose-Capillary: 137 mg/dL — ABNORMAL HIGH (ref 70–99)
Glucose-Capillary: 120 mg/dL — ABNORMAL HIGH (ref 70–99)
Glucose-Capillary: 136 mg/dL — ABNORMAL HIGH (ref 70–99)

## 2019-03-05 LAB — CBC WITH DIFFERENTIAL/PLATELET
Abs Immature Granulocytes: 0.15 10*3/uL — ABNORMAL HIGH (ref 0.00–0.07)
Basophils Absolute: 0 10*3/uL (ref 0.0–0.1)
Basophils Relative: 1 %
Eosinophils Absolute: 0.5 10*3/uL (ref 0.0–0.5)
Eosinophils Relative: 8 %
HCT: 25.8 % — ABNORMAL LOW (ref 39.0–52.0)
Hemoglobin: 8.1 g/dL — ABNORMAL LOW (ref 13.0–17.0)
Immature Granulocytes: 3 %
Lymphocytes Relative: 19 %
Lymphs Abs: 1.1 10*3/uL (ref 0.7–4.0)
MCH: 31.2 pg (ref 26.0–34.0)
MCHC: 31.4 g/dL (ref 30.0–36.0)
MCV: 99.2 fL (ref 80.0–100.0)
Monocytes Absolute: 0.5 10*3/uL (ref 0.1–1.0)
Monocytes Relative: 9 %
Neutro Abs: 3.7 10*3/uL (ref 1.7–7.7)
Neutrophils Relative %: 60 %
Platelets: 504 10*3/uL — ABNORMAL HIGH (ref 150–400)
RBC: 2.6 MIL/uL — ABNORMAL LOW (ref 4.22–5.81)
RDW: 13.7 % (ref 11.5–15.5)
WBC: 6 10*3/uL (ref 4.0–10.5)
nRBC: 0 % (ref 0.0–0.2)

## 2019-03-05 LAB — BASIC METABOLIC PANEL
Anion gap: 7 (ref 5–15)
BUN: 17 mg/dL (ref 6–20)
CO2: 30 mmol/L (ref 22–32)
Calcium: 8.9 mg/dL (ref 8.9–10.3)
Chloride: 102 mmol/L (ref 98–111)
Creatinine, Ser: 0.65 mg/dL (ref 0.61–1.24)
GFR calc Af Amer: >60 mL/min (ref >60)
GFR calc non Af Amer: >60 mL/min (ref >60)
Glucose, Bld: 117 mg/dL — ABNORMAL HIGH (ref 70–99)
Potassium: 4.7 mmol/L (ref 3.5–5.1)
Sodium: 139 mmol/L (ref 135–145)

## 2019-03-05 LAB — CBC
HCT: 23.8 % — ABNORMAL LOW (ref 39.0–52.0)
Hemoglobin: 7.6 g/dL — ABNORMAL LOW (ref 13.0–17.0)
MCH: 31.3 pg (ref 26.0–34.0)
MCHC: 31.9 g/dL (ref 30.0–36.0)
MCV: 97.9 fL (ref 80.0–100.0)
Platelets: 503 10*3/uL — ABNORMAL HIGH (ref 150–400)
RBC: 2.43 MIL/uL — ABNORMAL LOW (ref 4.22–5.81)
RDW: 13.4 % (ref 11.5–15.5)
WBC: 6.8 10*3/uL (ref 4.0–10.5)
nRBC: 0 % (ref 0.0–0.2)

## 2019-03-05 NOTE — Progress Notes (Signed)
Follow up - Trauma and Critical Care  Patient Details:    Jeremy [name] is an 23 y.o. male.  Lines/tubes : Airway 8 mm (Active)  Secured at (cm) 28 cm 03/03/2019  3:40 AM  Measured From Lips 03/03/2019  3:40 AM  Secured Location Right 03/03/2019  3:40 AM  Secured By [employer] 03/03/2019  3:40 AM  Tube Holder Repositioned Yes 03/03/2019  3:40 AM  Cuff Pressure (cm H2O) 28 cm H2O 03/02/2019  7:39 PM  Site Condition Dry 03/03/2019  3:40 AM     PICC Triple Lumen 02/22/19 PICC Left Brachial 48 cm 0 cm (Active)  Indication for Insertion or Continuance of Line Vasoactive infusions 03/03/2019  7:46 AM  Exposed Catheter (cm) 0 cm 02/22/2019  2:31 PM  Site Assessment Clean;Dry;Intact 03/02/2019  8:00 PM  Lumen #1 Status Infusing;Flushed 03/02/2019  8:00 PM  Lumen #2 Status Infusing;Flushed 03/02/2019  8:00 PM  Lumen #3 Status In-line blood sampling system in place 03/02/2019  8:00 PM  Dressing Type Transparent;Occlusive 03/02/2019  8:00 PM  Dressing Status Clean;Dry;Intact;Antimicrobial disc in place 03/02/2019  8:00 PM  Line Care Connections checked and tightened 03/02/2019  8:00 PM  Dressing Intervention Dressing changed;Antimicrobial disc changed 03/01/2019  6:00 AM  Dressing Change Due 03/08/19 03/02/2019  8:00 PM     Rectal Tube/Pouch (Active)  Output (mL) 100 mL 03/01/2019  6:00 AM     External Urinary Catheter (Active)  Collection Container Standard drainage bag 03/02/2019  8:00 PM  Intervention Equipment Changed 02/27/2019  6:58 PM  Output (mL) 300 mL 03/03/2019  7:00 AM    Microbiology/Sepsis markers: Results for orders placed or performed during the hospital encounter of 02/18/19  MRSA PCR Screening     Status: None   Collection Time: 02/19/19  2:02 AM  Result Value Ref Range Status   MRSA by PCR NEGATIVE NEGATIVE Final    Comment:        The GeneXpert MRSA Assay (FDA approved for NASAL specimens only), is one component of a comprehensive MRSA colonization surveillance program. It is  not intended to diagnose MRSA infection nor to guide or monitor treatment for MRSA infections. Performed at [facility], [address], [place], [place] [address]   SARS Coronavirus 2 [facility] order, Performed in [facility] hospital lab)     Status: None   Collection Time: 02/19/19  3:20 AM  Result Value Ref Range Status   SARS Coronavirus 2 NEGATIVE NEGATIVE Final    Comment: (NOTE) If result is NEGATIVE SARS-CoV-2 target nucleic acids are NOT DETECTED. The SARS-CoV-2 RNA is generally detectable in upper and lower  respiratory specimens during the acute phase of infection. The lowest  concentration of SARS-CoV-2 viral copies this assay can detect is 250  copies / mL. A negative result does not preclude SARS-CoV-2 infection  and should not be used as the sole basis for treatment or other  patient management decisions.  A negative result may occur with  improper specimen collection / handling, submission of specimen other  than nasopharyngeal swab, presence of viral mutation(s) within the  areas targeted by this assay, and inadequate number of viral copies  (<250 copies / mL). A negative result must be combined with clinical  observations, patient history, and epidemiological information. If result is POSITIVE SARS-CoV-2 target nucleic acids are DETECTED. The SARS-CoV-2 RNA is generally detectable in upper and lower  respiratory specimens dur ing the acute phase of infection.  Positive  results are indicative of  active infection with SARS-CoV-2.  Clinical  correlation with patient history and other diagnostic information is  necessary to determine patient infection status.  Positive results do  not rule out bacterial infection or co-infection with other viruses. If result is PRESUMPTIVE POSTIVE SARS-CoV-2 nucleic acids MAY BE PRESENT.   A presumptive positive result was obtained on the submitted specimen  and confirmed on repeat testing.  While 2019 novel coronavirus   (SARS-CoV-2) nucleic acids may be present in the submitted sample  additional confirmatory testing may be necessary for epidemiological  and / or clinical management purposes  to differentiate between  SARS-CoV-2 and other Sarbecovirus currently known to infect humans.  If clinically indicated additional testing with an alternate test  methodology [phone]) is advised. The SARS-CoV-2 RNA is generally  detectable in upper and lower respiratory sp ecimens during the acute  phase of infection. The expected result is Negative. Fact Sheet for Patients:  BoilerBrush.com.cy Fact Sheet for Healthcare Providers: https://pope.com/ This test is not yet approved or cleared by the [place] FDA and has been authorized for detection and/or diagnosis of SARS-CoV-2 by FDA under an Emergency Use Authorization (EUA).  This EUA will remain in effect (meaning this test can be used) for the duration of the COVID-19 declaration under Section 564(b)(1) of the Act, 21 U.S.C. section 360bbb-3(b)(1), unless the authorization is terminated or revoked sooner. Performed at [facility], [address], [place], [place] [address]   Respiratory Panel by PCR     Status: None   Collection Time: 02/19/19  3:20 AM  Result Value Ref Range Status   Adenovirus NOT DETECTED NOT DETECTED Final   Coronavirus 229E NOT DETECTED NOT DETECTED Final    Comment: (NOTE) The Coronavirus on the Respiratory Panel, DOES NOT test for the novel  Coronavirus (2019 nCoV)    Coronavirus HKU1 NOT DETECTED NOT DETECTED Final   Coronavirus NL63 NOT DETECTED NOT DETECTED Final   Coronavirus OC43 NOT DETECTED NOT DETECTED Final   Metapneumovirus NOT DETECTED NOT DETECTED Final   Rhinovirus / Enterovirus NOT DETECTED NOT DETECTED Final   Influenza A NOT DETECTED NOT DETECTED Final   Influenza B NOT DETECTED NOT DETECTED Final   Parainfluenza Virus 1 NOT DETECTED NOT DETECTED Final    Parainfluenza Virus 2 NOT DETECTED NOT DETECTED Final   Parainfluenza Virus 3 NOT DETECTED NOT DETECTED Final   Parainfluenza Virus 4 NOT DETECTED NOT DETECTED Final   Respiratory Syncytial Virus NOT DETECTED NOT DETECTED Final   Bordetella pertussis NOT DETECTED NOT DETECTED Final   Chlamydophila pneumoniae NOT DETECTED NOT DETECTED Final   Mycoplasma pneumoniae NOT DETECTED NOT DETECTED Final    Comment: Performed at [facility], [address], [place], [place] [address]  SARS Coronavirus 2 [facility] order, Performed in [facility] hospital lab)     Status: None   Collection Time: 02/19/19  6:33 PM  Result Value Ref Range Status   SARS Coronavirus 2 NEGATIVE NEGATIVE Final    Comment: (NOTE) If result is NEGATIVE SARS-CoV-2 target nucleic acids are NOT DETECTED. The SARS-CoV-2 RNA is generally detectable in upper and lower  respiratory specimens during the acute phase of infection. The lowest  concentration of SARS-CoV-2 viral copies this assay can detect is 250  copies / mL. A negative result does not preclude SARS-CoV-2 infection  and should not be used as the sole basis for treatment or other  patient management decisions.  A negative result may occur with  improper  specimen collection / handling, submission of specimen other  than nasopharyngeal swab, presence of viral mutation(s) within the  areas targeted by this assay, and inadequate number of viral copies  (<250 copies / mL). A negative result must be combined with clinical  observations, patient history, and epidemiological information. If result is POSITIVE SARS-CoV-2 target nucleic acids are DETECTED. The SARS-CoV-2 RNA is generally detectable in upper and lower  respiratory specimens dur ing the acute phase of infection.  Positive  results are indicative of active infection with SARS-CoV-2.  Clinical  correlation with patient history and other diagnostic information is  necessary to determine patient  infection status.  Positive results do  not rule out bacterial infection or co-infection with other viruses. If result is PRESUMPTIVE POSTIVE SARS-CoV-2 nucleic acids MAY BE PRESENT.   A presumptive positive result was obtained on the submitted specimen  and confirmed on repeat testing.  While 2019 novel coronavirus  (SARS-CoV-2) nucleic acids may be present in the submitted sample  additional confirmatory testing may be necessary for epidemiological  and / or clinical management purposes  to differentiate between  SARS-CoV-2 and other Sarbecovirus currently known to infect humans.  If clinically indicated additional testing with an alternate test  methodology [phone]([id number]) is advised. The SARS-CoV-2 RNA is generally  detectable in upper and lower respiratory sp ecimens during the acute  phase of infection. The expected result is Negative. Fact Sheet for Patients:  BoilerBrush.com.cyhttps://www.fda.gov/media/136312/download Fact Sheet for Healthcare Providers: https://pope.com/https://www.fda.gov/media/136313/download This test is not yet approved or cleared by the [place] FDA and has been authorized for detection and/or diagnosis of SARS-CoV-2 by FDA under an Emergency Use Authorization (EUA).  This EUA will remain in effect (meaning this test can be used) for the duration of the COVID-19 declaration under Section 564(b)(1) of the Act, 21 U.S.C. section 360bbb-3(b)(1), unless the authorization is terminated or revoked sooner. Performed at [facility], [address] [place], [place] [address]   Surgical PCR screen     Status: None   Collection Time: 02/22/19  7:36 AM  Result Value Ref Range Status   MRSA, PCR NEGATIVE NEGATIVE Final   Staphylococcus aureus NEGATIVE NEGATIVE Final    Comment: (NOTE) The Xpert SA Assay (FDA approved for NASAL specimens in patients 23 years of age and older), is one component of a comprehensive surveillance program. It is not intended to diagnose infection nor to guide or  monitor treatment. Performed at [facility], [address] [place], [place] [address]   Culture, respiratory (non-expectorated)     Status: None   Collection Time: 02/25/19 12:15 PM  Result Value Ref Range Status   Specimen Description TRACHEAL ASPIRATE  Final   Special Requests NONE  Final   Gram Stain NO WBC SEEN NO ORGANISMS SEEN   Final   Culture   Final    FEW HAEMOPHILUS INFLUENZAE BETA LACTAMASE NEGATIVE Performed at [facility], [address] [place], [place] [address]    Report Status 02/26/2019 FINAL  Final  Culture, blood (Routine X 2) w Reflex to ID Panel     Status: None   Collection Time: 02/25/19  2:36 PM  Result Value Ref Range Status   Specimen Description BLOOD RIGHT WRIST  Final   Special Requests   Final    AEROBIC BOTTLE ONLY Blood Culture results may not be optimal due to an inadequate volume of blood received in culture bottles   Culture   Final  NO GROWTH 5 DAYS Performed at [facility], [address] [place], [place] [address]    Report Status 03/02/2019 FINAL  Final  Culture, blood (Routine X 2) w Reflex to ID Panel     Status: None   Collection Time: 02/25/19  4:40 PM  Result Value Ref Range Status   Specimen Description BLOOD RIGHT HAND  Final   Special Requests AEROBIC BOTTLE ONLY Blood Culture adequate volume  Final   Culture   Final    NO GROWTH 5 DAYS Performed at [facility], [address], [place], [place] [address]    Report Status 03/02/2019 FINAL  Final    Anti-infectives:  Anti-infectives (From admission, onward)   Start     Dose/Rate Route Frequency Ordered Stop   02/27/19 1000  Ampicillin-Sulbactam (UNASYN) 3 g in sodium chloride 0.9 % 100 mL IVPB     3 g 200 mL/hr over 30 Minutes Intravenous Every 6 hours 02/27/19 0956 03/06/19 0359   02/25/19 1800  vancomycin (VANCOCIN) 2,000 mg in sodium chloride 0.9 % 500 mL IVPB  Status:  Discontinued     2,000 mg 250 mL/hr over 120 Minutes Intravenous  Every 12 hours 02/25/19 1723 02/27/19 0956   02/25/19 1800  piperacillin-tazobactam (ZOSYN) IVPB 3.375 g  Status:  Discontinued     3.375 g 12.5 mL/hr over 240 Minutes Intravenous Every 8 hours 02/25/19 1723 02/27/19 0956      Best Practice/Protocols:  VTE Prophylaxis: Lovenox (prophylaxtic dose) Continous Sedation  Consults:     Events:  Chief Complaint/Subjective:    Overnight Issues: Continued agitation issues, increased oral anxiolytics - precidex and BDZ, but continues to require high levels of drip sedation - trying to come down on versed today, weaning well  Objective:  Vital signs for last 24 hours: Temp:  [96.6 F (35.9 C)-98.8 F (37.1 C)] 98.6 F (37 C) (05/03 0800) Pulse Rate:  [70-100] 74 (05/03 0800) Resp:  [16-23] 16 (05/03 0800) BP: (100-120)/(55-81) 113/56 (05/03 0800) SpO2:  [98 %-100 %] 100 % (05/03 0816) FiO2 (%):  [40 %] 40 % (05/03 0816) Weight:  [96.2 kg] 96.2 kg (05/03 0453)  Hemodynamic parameters for last 24 hours:    Intake/Output from previous day: 05/02 0701 - 05/03 0700 In: 4277 [I.V.:2337.1; NG/GT:1540; IV Piggyback:399.9] Out: 5650 [Urine:5650]  Intake/Output this shift: Total I/O In: 102.8 [I.V.:102.8] Out: 350 [Urine:350]  Vent settings for last 24 hours: Vent Mode: PRVC FiO2 (%):  [40 %] 40 % Set Rate:  [16 bmp] 16 bmp Vt Set:  [098 mL] 620 mL PEEP:  [5 cmH20] 5 cmH20 Plateau Pressure:  [5 cmH20-22 cmH20] 5 cmH20  Physical Exam:  Gen: intubated, sedated HEENT: collar in place, ETT in place, feeding tube in place Resp: assisted, clear Cardiovascular: RRR Abdomen: soft, incision clean and dry, base open with clean base - possible fascial opening but no evisceration or clearly visible bowel - wet to dry replaced Ext: no edema Neuro: GCS 6T  Results for orders placed or performed during the hospital encounter of 02/18/19 (from the past 24 hour(s))  Glucose, capillary     Status: Abnormal   Collection Time: 03/04/19 12:07  PM  Result Value Ref Range   Glucose-Capillary 126 (H) 70 - 99 mg/dL   Comment 1 Notify RN    Comment 2 Document in Chart   Glucose, capillary     Status: None   Collection Time: 03/04/19  3:45 PM  Result Value Ref Range  Glucose-Capillary 97 70 - 99 mg/dL   Comment 1 Notify RN    Comment 2 Document in Chart   Glucose, capillary     Status: Abnormal   Collection Time: 03/04/19  7:31 PM  Result Value Ref Range   Glucose-Capillary 108 (H) 70 - 99 mg/dL  Glucose, capillary     Status: Abnormal   Collection Time: 03/04/19 11:16 PM  Result Value Ref Range   Glucose-Capillary 149 (H) 70 - 99 mg/dL  Glucose, capillary     Status: Abnormal   Collection Time: 03/05/19  3:17 AM  Result Value Ref Range   Glucose-Capillary 123 (H) 70 - 99 mg/dL  CBC     Status: Abnormal   Collection Time: 03/05/19  4:50 AM  Result Value Ref Range   WBC 6.8 4.0 - 10.5 K/uL   RBC 2.43 (L) 4.22 - 5.81 MIL/uL   Hemoglobin 7.6 (L) 13.0 - 17.0 g/dL   HCT 40.9 (L) 81.1 - 91.4 %   MCV 97.9 80.0 - 100.0 fL   MCH 31.3 26.0 - 34.0 pg   MCHC 31.9 30.0 - 36.0 g/dL   RDW 78.2 95.6 - 21.3 %   Platelets 503 (H) 150 - 400 K/uL   nRBC 0.0 0.0 - 0.2 %  Basic metabolic panel     Status: Abnormal   Collection Time: 03/05/19  5:59 AM  Result Value Ref Range   Sodium 139 135 - 145 mmol/L   Potassium 4.7 3.5 - 5.1 mmol/L   Chloride 102 98 - 111 mmol/L   CO2 30 22 - 32 mmol/L   Glucose, Bld 117 (H) 70 - 99 mg/dL   BUN 17 6 - 20 mg/dL   Creatinine, Ser 0.86 0.61 - 1.24 mg/dL   Calcium 8.9 8.9 - 57.8 mg/dL   GFR calc non Af Amer >60 >60 mL/min   GFR calc Af Amer >60 >60 mL/min   Anion gap 7 5 - 15  Glucose, capillary     Status: Abnormal   Collection Time: 03/05/19  7:28 AM  Result Value Ref Range   Glucose-Capillary 118 (H) 70 - 99 mg/dL   Comment 1 Notify RN    Comment 2 Document in Chart      Assessment/Plan:   PHBC Grade 4 liver laceration -S/P exploratory laparotomy, hepatorraphy, packing of liver 4/19  by Dr. Luisa Hart, S/P ex lap, removal of packs and partial closure 4/20 by Dr. Janee Morn. S/P ex lap and closure 4/22 by Dr. Janee Morn. L rib FX 2-3. Pain control.  Acute hypoxic ventilator dependent respiratory failure- weaning but on heavy sedation; hopefully to extubate later this week ETOH abuse disorder/acute alcohol withdrawal- Precedex, Versed, Fentanyl; CSW eval; max seroquel &klonopin ID- Unasyn 4/7 for H flu PNA C spine- plan to examine once more alert ABL anemia- hgb dropped to 7.6; likely dilutional to some degree; will plan for repeat hgb later today FEN- tolerating TF, creatinine stable, daily lasix VTE- PAS, lovenox Dispo- ICU, weaning, polysubstance withdrawal management   LOS: 15 days   Additional comments:I reviewed the patient's new clinical lab test results. Cr stable, Hgb stable at 9.4, no leukocytosis  Critical Care Total Time*:  Andria Meuse 03/05/2019  *Care during the described time interval was provided by me and/or other providers on the critical care team.  I have reviewed this patient's available data, including medical history, events of note, physical examination and test results as part of my evaluation.

## 2019-03-06 LAB — BASIC METABOLIC PANEL
Anion gap: 6 (ref 5–15)
BUN: 17 mg/dL (ref 6–20)
CO2: 24 mmol/L (ref 22–32)
Calcium: 7.1 mg/dL — ABNORMAL LOW (ref 8.9–10.3)
Chloride: 109 mmol/L (ref 98–111)
Creatinine, Ser: 0.61 mg/dL (ref 0.61–1.24)
GFR calc Af Amer: >60 mL/min (ref >60)
GFR calc non Af Amer: >60 mL/min (ref >60)
Glucose, Bld: 186 mg/dL — ABNORMAL HIGH (ref 70–99)
Potassium: 3.7 mmol/L (ref 3.5–5.1)
Sodium: 139 mmol/L (ref 135–145)

## 2019-03-06 LAB — CBC
HCT: 27.6 % — ABNORMAL LOW (ref 39.0–52.0)
Hemoglobin: 8.7 g/dL — ABNORMAL LOW (ref 13.0–17.0)
MCH: 31 pg (ref 26.0–34.0)
MCHC: 31.5 g/dL (ref 30.0–36.0)
MCV: 98.2 fL (ref 80.0–100.0)
Platelets: 565 10*3/uL — ABNORMAL HIGH (ref 150–400)
RBC: 2.81 MIL/uL — ABNORMAL LOW (ref 4.22–5.81)
RDW: 13.3 % (ref 11.5–15.5)
WBC: 5.1 10*3/uL (ref 4.0–10.5)
nRBC: 0 % (ref 0.0–0.2)

## 2019-03-06 LAB — GLUCOSE, CAPILLARY
Glucose-Capillary: 109 mg/dL — ABNORMAL HIGH (ref 70–99)
Glucose-Capillary: 114 mg/dL — ABNORMAL HIGH (ref 70–99)
Glucose-Capillary: 120 mg/dL — ABNORMAL HIGH (ref 70–99)
Glucose-Capillary: 136 mg/dL — ABNORMAL HIGH (ref 70–99)
Glucose-Capillary: 92 mg/dL (ref 70–99)
Glucose-Capillary: 94 mg/dL (ref 70–99)

## 2019-03-06 MED ORDER — IPRATROPIUM-ALBUTEROL 0.5-2.5 (3) MG/3ML IN SOLN
3.0000 mL | Freq: Three times a day (TID) | RESPIRATORY_TRACT | Status: DC
  Administered 2019-03-06 – 2019-03-10 (×11): 3 mL via RESPIRATORY_TRACT
  Filled 2019-03-06 (×10): qty 3

## 2019-03-06 MED ORDER — ONDANSETRON HCL 4 MG/2ML IJ SOLN
INTRAMUSCULAR | Status: AC
  Administered 2019-03-06: 4 mg
  Filled 2019-03-06: qty 2

## 2019-03-06 NOTE — Progress Notes (Signed)
Patient successfully extubated today  Spoke with mother Nida Boatman to update her on his progress and extubation. All questions were answered and she was happy to here of his progress.

## 2019-03-06 NOTE — Progress Notes (Signed)
Follow up - Trauma and Critical Care  Patient Details:    Jeremy [name] is an 23 y.o. male.  Lines/tubes : Airway 8 mm (Active)  Secured at (cm) 27 cm 03/06/2019  3:40 AM  Measured From Lips 03/06/2019  3:40 AM  Secured Location Left 03/06/2019  3:40 AM  Secured By [employer] 03/06/2019  3:40 AM  Tube Holder Repositioned Yes 03/06/2019  3:40 AM  Cuff Pressure (cm H2O) 22 cm H2O 03/05/2019  7:31 PM  Site Condition Dry 03/06/2019  3:40 AM     PICC Triple Lumen 02/22/19 PICC Left Brachial 48 cm 0 cm (Active)  Indication for Insertion or Continuance of Line Prolonged intravenous therapies 03/06/2019  7:06 AM  Exposed Catheter (cm) 0 cm 02/22/2019  2:31 PM  Site Assessment Clean;Dry;Intact 03/05/2019  8:00 PM  Lumen #1 Status Infusing 03/05/2019  8:00 PM  Lumen #2 Status Infusing 03/05/2019  8:00 PM  Lumen #3 Status Infusing;In-line blood sampling system in place;Blood return noted 03/05/2019  8:00 PM  Dressing Type Transparent;Occlusive 03/05/2019  8:00 PM  Dressing Status Clean;Dry;Intact;Antimicrobial disc in place 03/05/2019  8:00 PM  Line Care Connections checked and tightened 03/05/2019  8:00 PM  Dressing Intervention Dressing changed;Antimicrobial disc changed 03/01/2019  6:00 AM  Dressing Change Due 03/08/19 03/05/2019  8:00 PM     External Urinary Catheter (Active)  Collection Container Standard drainage bag 03/05/2019  8:00 AM  Securement Method Leg strap 03/05/2019  4:00 AM  Intervention Equipment Changed 02/27/2019  6:58 PM  Output (mL) 575 mL 03/06/2019  6:07 AM    Microbiology/Sepsis markers: Results for orders placed or performed during the hospital encounter of 02/18/19  MRSA PCR Screening     Status: None   Collection Time: 02/19/19  2:02 AM  Result Value Ref Range Status   MRSA by PCR NEGATIVE NEGATIVE Final    Comment:        The GeneXpert MRSA Assay (FDA approved for NASAL specimens only), is one component of a comprehensive MRSA colonization surveillance program. It is  not intended to diagnose MRSA infection nor to guide or monitor treatment for MRSA infections. Performed at [facility], [address], [place], [place] [address]   SARS Coronavirus 2 [facility] order, Performed in [facility] hospital lab)     Status: None   Collection Time: 02/19/19  3:20 AM  Result Value Ref Range Status   SARS Coronavirus 2 NEGATIVE NEGATIVE Final    Comment: (NOTE) If result is NEGATIVE SARS-CoV-2 target nucleic acids are NOT DETECTED. The SARS-CoV-2 RNA is generally detectable in upper and lower  respiratory specimens during the acute phase of infection. The lowest  concentration of SARS-CoV-2 viral copies this assay can detect is 250  copies / mL. A negative result does not preclude SARS-CoV-2 infection  and should not be used as the sole basis for treatment or other  patient management decisions.  A negative result may occur with  improper specimen collection / handling, submission of specimen other  than nasopharyngeal swab, presence of viral mutation(s) within the  areas targeted by this assay, and inadequate number of viral copies  (<250 copies / mL). A negative result must be combined with clinical  observations, patient history, and epidemiological information. If result is POSITIVE SARS-CoV-2 target nucleic acids are DETECTED. The SARS-CoV-2 RNA is generally detectable in upper and lower  respiratory specimens dur ing the acute phase of infection.  Positive  results are indicative of active infection with SARS-CoV-2.  Clinical  correlation with patient history and other diagnostic information is  necessary to determine patient infection status.  Positive results do  not rule out bacterial infection or co-infection with other viruses. If result is PRESUMPTIVE POSTIVE SARS-CoV-2 nucleic acids MAY BE PRESENT.   A presumptive positive result was obtained on the submitted specimen  and confirmed on repeat testing.  While 2019 novel coronavirus   (SARS-CoV-2) nucleic acids may be present in the submitted sample  additional confirmatory testing may be necessary for epidemiological  and / or clinical management purposes  to differentiate between  SARS-CoV-2 and other Sarbecovirus currently known to infect humans.  If clinically indicated additional testing with an alternate test  methodology [phone]) is advised. The SARS-CoV-2 RNA is generally  detectable in upper and lower respiratory sp ecimens during the acute  phase of infection. The expected result is Negative. Fact Sheet for Patients:  BoilerBrush.com.cy Fact Sheet for Healthcare Providers: https://pope.com/ This test is not yet approved or cleared by the [place] FDA and has been authorized for detection and/or diagnosis of SARS-CoV-2 by FDA under an Emergency Use Authorization (EUA).  This EUA will remain in effect (meaning this test can be used) for the duration of the COVID-19 declaration under Section 564(b)(1) of the Act, 21 U.S.C. section 360bbb-3(b)(1), unless the authorization is terminated or revoked sooner. Performed at [facility], [address] [place], [place] [address]   Respiratory Panel by PCR     Status: None   Collection Time: 02/19/19  3:20 AM  Result Value Ref Range Status   Adenovirus NOT DETECTED NOT DETECTED Final   Coronavirus 229E NOT DETECTED NOT DETECTED Final    Comment: (NOTE) The Coronavirus on the Respiratory Panel, DOES NOT test for the novel  Coronavirus (2019 nCoV)    Coronavirus HKU1 NOT DETECTED NOT DETECTED Final   Coronavirus NL63 NOT DETECTED NOT DETECTED Final   Coronavirus OC43 NOT DETECTED NOT DETECTED Final   Metapneumovirus NOT DETECTED NOT DETECTED Final   Rhinovirus / Enterovirus NOT DETECTED NOT DETECTED Final   Influenza A NOT DETECTED NOT DETECTED Final   Influenza B NOT DETECTED NOT DETECTED Final   Parainfluenza Virus 1 NOT DETECTED NOT DETECTED Final    Parainfluenza Virus 2 NOT DETECTED NOT DETECTED Final   Parainfluenza Virus 3 NOT DETECTED NOT DETECTED Final   Parainfluenza Virus 4 NOT DETECTED NOT DETECTED Final   Respiratory Syncytial Virus NOT DETECTED NOT DETECTED Final   Bordetella pertussis NOT DETECTED NOT DETECTED Final   Chlamydophila pneumoniae NOT DETECTED NOT DETECTED Final   Mycoplasma pneumoniae NOT DETECTED NOT DETECTED Final    Comment: Performed at [facility], [address] [place], [place] [address]  SARS Coronavirus 2 [facility] order, Performed in [facility] hospital lab)     Status: None   Collection Time: 02/19/19  6:33 PM  Result Value Ref Range Status   SARS Coronavirus 2 NEGATIVE NEGATIVE Final    Comment: (NOTE) If result is NEGATIVE SARS-CoV-2 target nucleic acids are NOT DETECTED. The SARS-CoV-2 RNA is generally detectable in upper and lower  respiratory specimens during the acute phase of infection. The lowest  concentration of SARS-CoV-2 viral copies this assay can detect is 250  copies / mL. A negative result does not preclude SARS-CoV-2 infection  and should not be used as the sole basis for treatment or other  patient management decisions.  A negative result may occur with  improper specimen collection / handling, submission  of specimen other  than nasopharyngeal swab, presence of viral mutation(s) within the  areas targeted by this assay, and inadequate number of viral copies  (<250 copies / mL). A negative result must be combined with clinical  observations, patient history, and epidemiological information. If result is POSITIVE SARS-CoV-2 target nucleic acids are DETECTED. The SARS-CoV-2 RNA is generally detectable in upper and lower  respiratory specimens dur ing the acute phase of infection.  Positive  results are indicative of active infection with SARS-CoV-2.  Clinical  correlation with patient history and other diagnostic information is  necessary to determine patient  infection status.  Positive results do  not rule out bacterial infection or co-infection with other viruses. If result is PRESUMPTIVE POSTIVE SARS-CoV-2 nucleic acids MAY BE PRESENT.   A presumptive positive result was obtained on the submitted specimen  and confirmed on repeat testing.  While 2019 novel coronavirus  (SARS-CoV-2) nucleic acids may be present in the submitted sample  additional confirmatory testing may be necessary for epidemiological  and / or clinical management purposes  to differentiate between  SARS-CoV-2 and other Sarbecovirus currently known to infect humans.  If clinically indicated additional testing with an alternate test  methodology [phone]) is advised. The SARS-CoV-2 RNA is generally  detectable in upper and lower respiratory sp ecimens during the acute  phase of infection. The expected result is Negative. Fact Sheet for Patients:  BoilerBrush.com.cy Fact Sheet for Healthcare Providers: https://pope.com/ This test is not yet approved or cleared by the [place] FDA and has been authorized for detection and/or diagnosis of SARS-CoV-2 by FDA under an Emergency Use Authorization (EUA).  This EUA will remain in effect (meaning this test can be used) for the duration of the COVID-19 declaration under Section 564(b)(1) of the Act, 21 U.S.C. section 360bbb-3(b)(1), unless the authorization is terminated or revoked sooner. Performed at [facility], [address] [place], [place] [address]   Surgical PCR screen     Status: None   Collection Time: 02/22/19  7:36 AM  Result Value Ref Range Status   MRSA, PCR NEGATIVE NEGATIVE Final   Staphylococcus aureus NEGATIVE NEGATIVE Final    Comment: (NOTE) The Xpert SA Assay (FDA approved for NASAL specimens in patients 4 years of age and older), is one component of a comprehensive surveillance program. It is not intended to diagnose infection nor to guide or  monitor treatment. Performed at [facility], [address] [place], [place] [address]   Culture, respiratory (non-expectorated)     Status: None   Collection Time: 02/25/19 12:15 PM  Result Value Ref Range Status   Specimen Description TRACHEAL ASPIRATE  Final   Special Requests NONE  Final   Gram Stain NO WBC SEEN NO ORGANISMS SEEN   Final   Culture   Final    FEW HAEMOPHILUS INFLUENZAE BETA LACTAMASE NEGATIVE Performed at [facility], [address], [place], [place] [address]    Report Status 02/26/2019 FINAL  Final  Culture, blood (Routine X 2) w Reflex to ID Panel     Status: None   Collection Time: 02/25/19  2:36 PM  Result Value Ref Range Status   Specimen Description BLOOD RIGHT WRIST  Final   Special Requests   Final    AEROBIC BOTTLE ONLY Blood Culture results may not be optimal due to an inadequate volume of blood received in culture bottles   Culture   Final    NO GROWTH 5 DAYS  Performed at [facility], [address], [place], [place] [address]    Report Status 03/02/2019 FINAL  Final  Culture, blood (Routine X 2) w Reflex to ID Panel     Status: None   Collection Time: 02/25/19  4:40 PM  Result Value Ref Range Status   Specimen Description BLOOD RIGHT HAND  Final   Special Requests AEROBIC BOTTLE ONLY Blood Culture adequate volume  Final   Culture   Final    NO GROWTH 5 DAYS Performed at [facility], [address], [place], [place] [address]    Report Status 03/02/2019 FINAL  Final    Anti-infectives:  Anti-infectives (From admission, onward)   Start     Dose/Rate Route Frequency Ordered Stop   02/27/19 1000  Ampicillin-Sulbactam (UNASYN) 3 g in sodium chloride 0.9 % 100 mL IVPB     3 g 200 mL/hr over 30 Minutes Intravenous Every 6 hours 02/27/19 0956 03/05/19 2154   02/25/19 1800  vancomycin (VANCOCIN) 2,000 mg in sodium chloride 0.9 % 500 mL IVPB  Status:  Discontinued     2,000 mg 250 mL/hr over 120 Minutes Intravenous  Every 12 hours 02/25/19 1723 02/27/19 0956   02/25/19 1800  piperacillin-tazobactam (ZOSYN) IVPB 3.375 g  Status:  Discontinued     3.375 g 12.5 mL/hr over 240 Minutes Intravenous Every 8 hours 02/25/19 1723 02/27/19 0956      Best Practice/Protocols:  VTE Prophylaxis: Lovenox (prophylaxtic dose) Continous Sedation  Consults:     Events:  Chief Complaint/Subjective:    Overnight Issues: Continued agitation issues, able to wean versed down some  Objective:  Vital signs for last 24 hours: Temp:  [97.5 F (36.4 C)-99.1 F (37.3 C)] 98.8 F (37.1 C) (05/04 0600) Pulse Rate:  [67-121] 72 (05/04 0600) Resp:  [16-22] 16 (05/04 0600) BP: (95-126)/(47-76) 102/55 (05/04 0600) SpO2:  [88 %-100 %] 99 % (05/04 0600) FiO2 (%):  [40 %] 40 % (05/04 0340) Weight:  [96.4 kg] 96.4 kg (05/04 0327)  Hemodynamic parameters for last 24 hours:    Intake/Output from previous day: 05/03 0701 - 05/04 0700 In: 4334.2 [I.V.:2354.1; NG/GT:1680; IV Piggyback:300.1] Out: 5325 [Urine:5325]  Intake/Output this shift: No intake/output data recorded.  Vent settings for last 24 hours: Vent Mode: PRVC FiO2 (%):  [40 %] 40 % Set Rate:  [16 bmp] 16 bmp Vt Set:  [620 mL] 620 mL PEEP:  [5 cmH20] 5 cmH20 Plateau Pressure:  [16 cmH20-22 cmH20] 22 cmH20  Physical Exam:  Gen: intubated, sedated HEENT: ETT and feeding tube in place, collar in place Resp: assisted, clear Cardiovascular: RRR Abdomen: soft, NT, ND, abdomen C/D/I with lower 8cm area open with clean base and appropriately packed Ext: no edema Neuro: GCS 6t  Results for orders placed or performed during the hospital encounter of 02/18/19 (from the past 24 hour(s))  Glucose, capillary     Status: Abnormal   Collection Time: 03/05/19 11:27 AM  Result Value Ref Range   Glucose-Capillary 116 (H) 70 - 99 mg/dL   Comment 1 Notify RN    Comment 2 Document in Chart   CBC with Differential/Platelet     Status: Abnormal   Collection Time:  03/05/19  3:08 PM  Result Value Ref Range   WBC 6.0 4.0 - 10.5 K/uL   RBC 2.60 (L) 4.22 - 5.81 MIL/uL   Hemoglobin 8.1 (L) 13.0 - 17.0 g/dL   HCT 09.8 (L) 11.9 - 14.7 %   MCV 99.2  80.0 - 100.0 fL   MCH 31.2 26.0 - 34.0 pg   MCHC 31.4 30.0 - 36.0 g/dL   RDW 16.113.7 09.611.5 - 04.515.5 %   Platelets 504 (H) 150 - 400 K/uL   nRBC 0.0 0.0 - 0.2 %   Neutrophils Relative % 60 %   Neutro Abs 3.7 1.7 - 7.7 K/uL   Lymphocytes Relative 19 %   Lymphs Abs 1.1 0.7 - 4.0 K/uL   Monocytes Relative 9 %   Monocytes Absolute 0.5 0.1 - 1.0 K/uL   Eosinophils Relative 8 %   Eosinophils Absolute 0.5 0.0 - 0.5 K/uL   Basophils Relative 1 %   Basophils Absolute 0.0 0.0 - 0.1 K/uL   Immature Granulocytes 3 %   Abs Immature Granulocytes 0.15 (H) 0.00 - 0.07 K/uL  Glucose, capillary     Status: Abnormal   Collection Time: 03/05/19  3:27 PM  Result Value Ref Range   Glucose-Capillary 137 (H) 70 - 99 mg/dL   Comment 1 Notify RN    Comment 2 Document in Chart   Glucose, capillary     Status: Abnormal   Collection Time: 03/05/19  7:25 PM  Result Value Ref Range   Glucose-Capillary 120 (H) 70 - 99 mg/dL  Glucose, capillary     Status: Abnormal   Collection Time: 03/05/19 11:18 PM  Result Value Ref Range   Glucose-Capillary 136 (H) 70 - 99 mg/dL  CBC     Status: Abnormal   Collection Time: 03/06/19  3:28 AM  Result Value Ref Range   WBC 5.1 4.0 - 10.5 K/uL   RBC 2.81 (L) 4.22 - 5.81 MIL/uL   Hemoglobin 8.7 (L) 13.0 - 17.0 g/dL   HCT 40.927.6 (L) 81.139.0 - 91.452.0 %   MCV 98.2 80.0 - 100.0 fL   MCH 31.0 26.0 - 34.0 pg   MCHC 31.5 30.0 - 36.0 g/dL   RDW 78.213.3 95.611.5 - 21.315.5 %   Platelets 565 (H) 150 - 400 K/uL   nRBC 0.0 0.0 - 0.2 %  Basic metabolic panel     Status: Abnormal   Collection Time: 03/06/19  3:28 AM  Result Value Ref Range   Sodium 139 135 - 145 mmol/L   Potassium 3.7 3.5 - 5.1 mmol/L   Chloride 109 98 - 111 mmol/L   CO2 24 22 - 32 mmol/L   Glucose, Bld 186 (H) 70 - 99 mg/dL   BUN 17 6 - 20 mg/dL    Creatinine, Ser 0.860.61 0.61 - 1.24 mg/dL   Calcium 7.1 (L) 8.9 - 10.3 mg/dL   GFR calc non Af Amer >60 >60 mL/min   GFR calc Af Amer >60 >60 mL/min   Anion gap 6 5 - 15  Glucose, capillary     Status: Abnormal   Collection Time: 03/06/19  3:49 AM  Result Value Ref Range   Glucose-Capillary 114 (H) 70 - 99 mg/dL  Glucose, capillary     Status: Abnormal   Collection Time: 03/06/19  7:59 AM  Result Value Ref Range   Glucose-Capillary 136 (H) 70 - 99 mg/dL     Assessment/Plan:   PHBC Grade 4 liver laceration -S/P exploratory laparotomy, hepatorraphy, packing of liver 4/19 by Dr. Luisa Hartornett, S/P ex lap, removal of packs and partial closure 4/20 by Dr. Janee Mornhompson. S/P ex lap and closure 4/22 by Dr. Janee Mornhompson. L rib FX 2-3. Pain control.  Acute hypoxic ventilator dependent respiratory failure-weaning but on heavy sedation; hopefully to extubate later this week ETOH  abuse disorder/acute alcohol withdrawal- Precedex, Versed, Fentanyl; CSW eval;maxseroquel &klonopin ID- Unasyn4/28for H flu PNA C spine- plan to examine once more alert ABL anemia- hgb 8.7 up from 7.6, will continue to monitor FEN- tolerating TF,creatinine stable, daily lasix VTE- PAS, lovenox Dispo- ICU, weaning, polysubstance withdrawal management   LOS: 16 days   Additional comments:I reviewed the patient's new clinical lab test results. Hgb 8.7 up from 7.6  Critical Care Total Time*:  De Blanch Emersynn Deatley 03/06/2019  *Care during the described time interval was provided by me and/or other providers on the critical care team.  I have reviewed this patient's available data, including medical history, events of note, physical examination and test results as part of my evaluation.

## 2019-03-06 NOTE — Procedures (Signed)
Extubation Procedure Note  Patient Details:   Name: Jeremy [name] DOB: July 04, 1996 MRN: [id number]   Airway Documentation:    Vent end date: 03/06/19 Vent end time: 1240   Evaluation  O2 sats: stable throughout Complications: No apparent complications Patient did tolerate procedure well. Bilateral Breath Sounds: Clear, Diminished   Yes   Positive cuff leak noted. Patient placed on [place] 4 L with humidity, no stridor noted. Patient able to reach 375 mL using incentive spirometer.  Rayburn Felt 03/06/2019, 12:48 PM

## 2019-03-07 ENCOUNTER — Inpatient Hospital Stay (HOSPITAL_COMMUNITY): Payer: Medicaid Other

## 2019-03-07 LAB — GLUCOSE, CAPILLARY
Glucose-Capillary: 100 mg/dL — ABNORMAL HIGH (ref 70–99)
Glucose-Capillary: 108 mg/dL — ABNORMAL HIGH (ref 70–99)
Glucose-Capillary: 110 mg/dL — ABNORMAL HIGH (ref 70–99)
Glucose-Capillary: 111 mg/dL — ABNORMAL HIGH (ref 70–99)
Glucose-Capillary: 120 mg/dL — ABNORMAL HIGH (ref 70–99)
Glucose-Capillary: 126 mg/dL — ABNORMAL HIGH (ref 70–99)

## 2019-03-07 LAB — CBC
HCT: 36.3 % — ABNORMAL LOW (ref 39.0–52.0)
Hemoglobin: 12 g/dL — ABNORMAL LOW (ref 13.0–17.0)
MCH: 31.2 pg (ref 26.0–34.0)
MCHC: 33.1 g/dL (ref 30.0–36.0)
MCV: 94.3 fL (ref 80.0–100.0)
Platelets: 834 10*3/uL — ABNORMAL HIGH (ref 150–400)
RBC: 3.85 MIL/uL — ABNORMAL LOW (ref 4.22–5.81)
RDW: 13.2 % (ref 11.5–15.5)
WBC: 17.5 10*3/uL — ABNORMAL HIGH (ref 4.0–10.5)
nRBC: 0 % (ref 0.0–0.2)

## 2019-03-07 LAB — BASIC METABOLIC PANEL
Anion gap: 14 (ref 5–15)
BUN: 22 mg/dL — ABNORMAL HIGH (ref 6–20)
CO2: 22 mmol/L (ref 22–32)
Calcium: 9.5 mg/dL (ref 8.9–10.3)
Chloride: 98 mmol/L (ref 98–111)
Creatinine, Ser: 0.8 mg/dL (ref 0.61–1.24)
GFR calc Af Amer: >60 mL/min (ref >60)
GFR calc non Af Amer: >60 mL/min (ref >60)
Glucose, Bld: 126 mg/dL — ABNORMAL HIGH (ref 70–99)
Potassium: 4.1 mmol/L (ref 3.5–5.1)
Sodium: 134 mmol/L — ABNORMAL LOW (ref 135–145)

## 2019-03-07 MED ORDER — ORAL CARE MOUTH RINSE
15.0000 mL | Freq: Two times a day (BID) | OROMUCOSAL | Status: DC
  Administered 2019-03-07 – 2019-03-11 (×7): 15 mL via OROMUCOSAL

## 2019-03-07 MED ORDER — ONDANSETRON HCL 4 MG/2ML IJ SOLN
INTRAMUSCULAR | Status: AC
  Filled 2019-03-07: qty 2

## 2019-03-07 MED ORDER — SODIUM CHLORIDE 0.9 % IV SOLN
3.0000 g | Freq: Four times a day (QID) | INTRAVENOUS | Status: DC
  Administered 2019-03-07 – 2019-03-11 (×17): 3 g via INTRAVENOUS
  Filled 2019-03-07 (×20): qty 3

## 2019-03-07 MED ORDER — SENNOSIDES-DOCUSATE SODIUM 8.6-50 MG PO TABS: 1.0000 | ORAL_TABLET | Freq: Two times a day (BID) | ORAL | Status: DC | PRN

## 2019-03-07 MED ORDER — CHLORHEXIDINE GLUCONATE 0.12 % MT SOLN
15.0000 mL | Freq: Two times a day (BID) | OROMUCOSAL | Status: DC
  Administered 2019-03-07 – 2019-03-11 (×5): 15 mL via OROMUCOSAL
  Filled 2019-03-07 (×3): qty 15

## 2019-03-07 MED ORDER — ONDANSETRON HCL 4 MG/2ML IJ SOLN
4.0000 mg | Freq: Four times a day (QID) | INTRAMUSCULAR | Status: DC | PRN
  Administered 2019-03-07 (×2): 4 mg via INTRAVENOUS
  Filled 2019-03-07: qty 2

## 2019-03-07 MED ORDER — DOCUSATE SODIUM 50 MG/5ML PO LIQD
100.0000 mg | Freq: Two times a day (BID) | ORAL | Status: DC
  Administered 2019-03-07 – 2019-03-09 (×3): 100 mg
  Filled 2019-03-07 (×3): qty 10

## 2019-03-07 NOTE — Progress Notes (Signed)
Rehab Admissions Coordinator Note:  Patient was screened by Clois Dupes for appropriateness for an Inpatient Acute Rehab Consult per PT recs. At this time, we are recommending Inpatient Rehab consult when appropriate. Please advise.  Clois Dupes RN MSN 03/07/2019, 2:58 PM  I can be reached at [phone].

## 2019-03-07 NOTE — Progress Notes (Signed)
13 Days Post-Op   Subjective/Chief Complaint: Pt with emesis overnight TFs on hold Some agitation overnight Responds min to name   Objective: Vital signs in last 24 hours: Temp:  [98.2 F (36.8 C)-99.9 F (37.7 C)] 99.1 F (37.3 C) (05/05 0400) Pulse Rate:  [73-121] 121 (05/05 0500) Resp:  [15-34] 34 (05/05 0700) BP: (84-152)/(48-100) 121/63 (05/05 0700) SpO2:  [95 %-100 %] 100 % (05/05 0500) FiO2 (%):  [40 %] 40 % (05/04 1200) Weight:  [96 kg] 96 kg (05/05 0500) Last BM Date: (5/2)  Intake/Output from previous day: 05/04 0701 - 05/05 0700 In: 2939.8 [I.V.:1749.8; NG/GT:1190] Out: 1600 [Urine:1600] Intake/Output this shift: No intake/output data recorded.  Gen: NAD, HEENT: collar in place Resp: coarse upper a/w BS Cardiovascular: RRR Abdomen: soft, NT, ND, abdomen C/D/I with lower 8cm area open with clean base beefy red and appropriately packed Ext: no edema Neuro: sedated, responds min to name   Lab Results:  Recent Labs    03/06/19 0328 03/07/19 0443  WBC 5.1 17.5*  HGB 8.7* 12.0*  HCT 27.6* 36.3*  PLT 565* 834*   BMET Recent Labs    03/06/19 0328 03/07/19 0443  NA 139 134*  K 3.7 4.1  CL 109 98  CO2 24 22  GLUCOSE 186* 126*  BUN 17 22*  CREATININE 0.61 0.80  CALCIUM 7.1* 9.5   Anti-infectives: Anti-infectives (From admission, onward)   Start     Dose/Rate Route Frequency Ordered Stop   02/27/19 1000  Ampicillin-Sulbactam (UNASYN) 3 g in sodium chloride 0.9 % 100 mL IVPB     3 g 200 mL/hr over 30 Minutes Intravenous Every 6 hours 02/27/19 0956 03/05/19 2154   02/25/19 1800  vancomycin (VANCOCIN) 2,000 mg in sodium chloride 0.9 % 500 mL IVPB  Status:  Discontinued     2,000 mg 250 mL/hr over 120 Minutes Intravenous Every 12 hours 02/25/19 1723 02/27/19 0956   02/25/19 1800  piperacillin-tazobactam (ZOSYN) IVPB 3.375 g  Status:  Discontinued     3.375 g 12.5 mL/hr over 240 Minutes Intravenous Every 8 hours 02/25/19 1723 02/27/19 0956       Assessment/Plan: PHBC Grade 4 liver laceration -S/P exploratory laparotomy, hepatorraphy, packing of liver 4/19 by Dr. Luisa Hart, S/P ex lap, removal of packs and partial closure 4/20 by Dr. Janee Morn. S/P ex lap and closure 4/22 by Dr. Janee Morn. L rib FX 2-3. Pain control.  Acute hypoxic ventilator dependent respiratory failure-weaning but on heavy sedation; hopefully to extubate later this week ETOH abuse disorder/acute alcohol withdrawal- Precedex, Versed,Fentanyl;CSW eval;maxseroquel &klonopin ID- Unasyntx finishedfor H flu PNA, possible aspiration as WBC increased and coarse BS, CXR pending C spine- plan to examine once more alert ABL anemia- stable FEN- tolerating TF,creatinine stable, daily lasix VTE- PAS, lovenox Dispo- ICU, polysubstance withdrawal management   LOS: 16 days   Additional comments:I reviewed the patient's new clinical lab test results. WBC up from 5 --> 17  Critical Care Total Time*:   LOS: 17 days    Jeremy [name] 03/07/2019

## 2019-03-07 NOTE — Progress Notes (Signed)
Assisted tele visit to patient with family member.  Dannell Gortney Ferrer, RN  
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
## 2019-03-07 NOTE — Progress Notes (Signed)
Pharmacy Antibiotic Note  Jeremy [name] is a 23 y.o. male admitted on 02/18/2019 with possible aspiration pneumonia.  Pharmacy has been consulted for Unasyn dosing. Recently finished 10 day course of antibiotics for pneumonia. Tmax 101.9, WBC 5.1>>17.5  Plan: Restart Unasyn 3g q6h Follow culture data, renal function, clinical status, LOT  Height: 6' (182.9 cm) Weight: 211 lb 10.3 oz (96 kg) IBW/kg (Calculated) : 77.6  Temp (24hrs), Avg:99.3 F (37.4 C), Min:98.2 F (36.8 C), Max:101.9 F (38.8 C)  Recent Labs  Lab 03/03/19 0500 03/04/19 0621 03/05/19 0450 03/05/19 0559 03/05/19 1508 03/06/19 0328 03/07/19 0443  WBC 9.6 8.0 6.8  --  6.0 5.1 17.5*  CREATININE 0.71 0.68  --  0.65  --  0.61 0.80    Estimated Creatinine Clearance: 172.7 mL/min (by C-G formula based on SCr of 0.8 mg/dL).    No Known Allergies  Vancomycin 4/25>4/27 Zosyn 4/25>4/27 Unasyn 4/27 >>5/3; 5/5>  4/19 COVID negative x2 4/19 RVP negative 4/22 MRSA, St Aureus negative 4/25 Trach Asp -few H.Flu (beta lactamase negative) 4/25 BCx >> negative*  Thank you for allowing pharmacy to be a part of this patient's care.  Wendelyn Breslow, PharmD PGY1 Pharmacy Resident Phone: [phone] 03/07/2019 9:33 AM

## 2019-03-07 NOTE — Progress Notes (Signed)
CPT not done at this time. PT is working with the patient.

## 2019-03-08 LAB — GLUCOSE, CAPILLARY
Glucose-Capillary: 108 mg/dL — ABNORMAL HIGH (ref 70–99)
Glucose-Capillary: 108 mg/dL — ABNORMAL HIGH (ref 70–99)
Glucose-Capillary: 111 mg/dL — ABNORMAL HIGH (ref 70–99)
Glucose-Capillary: 124 mg/dL — ABNORMAL HIGH (ref 70–99)
Glucose-Capillary: 127 mg/dL — ABNORMAL HIGH (ref 70–99)
Glucose-Capillary: 132 mg/dL — ABNORMAL HIGH (ref 70–99)

## 2019-03-08 LAB — BASIC METABOLIC PANEL
Anion gap: 11 (ref 5–15)
BUN: 27 mg/dL — ABNORMAL HIGH (ref 6–20)
CO2: 24 mmol/L (ref 22–32)
Calcium: 9.1 mg/dL (ref 8.9–10.3)
Chloride: 103 mmol/L (ref 98–111)
Creatinine, Ser: 0.84 mg/dL (ref 0.61–1.24)
GFR calc Af Amer: >60 mL/min (ref >60)
GFR calc non Af Amer: >60 mL/min (ref >60)
Glucose, Bld: 159 mg/dL — ABNORMAL HIGH (ref 70–99)
Potassium: 4.6 mmol/L (ref 3.5–5.1)
Sodium: 138 mmol/L (ref 135–145)

## 2019-03-08 LAB — CBC
HCT: 35.8 % — ABNORMAL LOW (ref 39.0–52.0)
Hemoglobin: 11.6 g/dL — ABNORMAL LOW (ref 13.0–17.0)
MCH: 30.4 pg (ref 26.0–34.0)
MCHC: 32.4 g/dL (ref 30.0–36.0)
MCV: 94 fL (ref 80.0–100.0)
Platelets: 722 10*3/uL — ABNORMAL HIGH (ref 150–400)
RBC: 3.81 MIL/uL — ABNORMAL LOW (ref 4.22–5.81)
RDW: 13.3 % (ref 11.5–15.5)
WBC: 9.9 10*3/uL (ref 4.0–10.5)
nRBC: 0 % (ref 0.0–0.2)

## 2019-03-08 NOTE — Progress Notes (Signed)
Spoke with Marsh Dolly about the patient's picc line at this time the line will not be TPA'd due to the possibility of removal of the PICC line the RN will place a PIV and then place a consult to remove the PICC line.

## 2019-03-08 NOTE — Progress Notes (Signed)
Tried to contact patient's mother, but unable to reach her at her facility.  Letha Cape 10:14 AM 03/08/2019
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
## 2019-03-08 NOTE — Progress Notes (Signed)
Cleared cervical spine. Patient agitated but alert and stable at this time.  Wells Guiles , [facility] Surgery 03/08/2019, 3:43 PM Pager: [phone]
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
## 2019-03-08 NOTE — Progress Notes (Signed)
Patient finally resting comfortably. Will hold CPT until next scheduled treatment. RN at bedside.

## 2019-03-08 NOTE — Progress Notes (Signed)
1200 CPT not performed due to picc line just being removed. IV RN requested it to be held at this time.
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
## 2019-03-09 LAB — GLUCOSE, CAPILLARY
Glucose-Capillary: 107 mg/dL — ABNORMAL HIGH (ref 70–99)
Glucose-Capillary: 117 mg/dL — ABNORMAL HIGH (ref 70–99)
Glucose-Capillary: 120 mg/dL — ABNORMAL HIGH (ref 70–99)
Glucose-Capillary: 124 mg/dL — ABNORMAL HIGH (ref 70–99)
Glucose-Capillary: 137 mg/dL — ABNORMAL HIGH (ref 70–99)

## 2019-03-09 MED ORDER — RESOURCE THICKENUP CLEAR PO POWD
ORAL | Status: DC | PRN
  Filled 2019-03-09: qty 125
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
## 2019-03-09 NOTE — Progress Notes (Signed)
RT NOTE: CPT held at this time due to patient working with physical therapy and sitting in chair, RN aware. RT will attempt at next scheduled time. RT will continue to monitor as needed.

## 2019-03-09 NOTE — Progress Notes (Signed)
CPT held per RN request. Patient sleeping at this time. Will check back on next scheduled treatment time.
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
## 2019-03-10 DIAGNOSIS — S069X9A Unspecified intracranial injury with loss of consciousness of unspecified duration, initial encounter: Secondary | ICD-10-CM

## 2019-03-10 LAB — BASIC METABOLIC PANEL
Anion gap: 11 (ref 5–15)
BUN: 20 mg/dL (ref 6–20)
CO2: 25 mmol/L (ref 22–32)
Calcium: 9.2 mg/dL (ref 8.9–10.3)
Chloride: 103 mmol/L (ref 98–111)
Creatinine, Ser: 0.78 mg/dL (ref 0.61–1.24)
GFR calc Af Amer: >60 mL/min (ref >60)
GFR calc non Af Amer: >60 mL/min (ref >60)
Glucose, Bld: 116 mg/dL — ABNORMAL HIGH (ref 70–99)
Potassium: 3.6 mmol/L (ref 3.5–5.1)
Sodium: 139 mmol/L (ref 135–145)

## 2019-03-10 LAB — CBC
HCT: 35.3 % — ABNORMAL LOW (ref 39.0–52.0)
Hemoglobin: 11.4 g/dL — ABNORMAL LOW (ref 13.0–17.0)
MCH: 30.5 pg (ref 26.0–34.0)
MCHC: 32.3 g/dL (ref 30.0–36.0)
MCV: 94.4 fL (ref 80.0–100.0)
Platelets: 736 10*3/uL — ABNORMAL HIGH (ref 150–400)
RBC: 3.74 MIL/uL — ABNORMAL LOW (ref 4.22–5.81)
RDW: 13.2 % (ref 11.5–15.5)
WBC: 8.6 10*3/uL (ref 4.0–10.5)
nRBC: 0 % (ref 0.0–0.2)

## 2019-03-10 LAB — GLUCOSE, CAPILLARY
Glucose-Capillary: 155 mg/dL — ABNORMAL HIGH (ref 70–99)
Glucose-Capillary: 132 mg/dL — ABNORMAL HIGH (ref 70–99)
Glucose-Capillary: 109 mg/dL — ABNORMAL HIGH (ref 70–99)
Glucose-Capillary: 100 mg/dL — ABNORMAL HIGH (ref 70–99)
Glucose-Capillary: 174 mg/dL — ABNORMAL HIGH (ref 70–99)

## 2019-03-10 MED ORDER — IPRATROPIUM-ALBUTEROL 0.5-2.5 (3) MG/3ML IN SOLN: 3.0000 mL | Freq: Four times a day (QID) | RESPIRATORY_TRACT | Status: DC | PRN

## 2019-03-10 MED ORDER — OXYCODONE HCL 5 MG/5ML PO SOLN
10.0000 mg | ORAL | Status: DC
  Administered 2019-03-11 (×2): 10 mg via ORAL
  Filled 2019-03-10 (×4): qty 10

## 2019-03-10 MED ORDER — QUETIAPINE FUMARATE 200 MG PO TABS
200.0000 mg | ORAL_TABLET | Freq: Three times a day (TID) | ORAL | Status: DC
  Administered 2019-03-10 – 2019-03-11 (×3): 200 mg via ORAL
  Filled 2019-03-10 (×3): qty 1

## 2019-03-10 MED ORDER — IBUPROFEN 100 MG/5ML PO SUSP: 600.0000 mg | Freq: Four times a day (QID) | ORAL | Status: DC | PRN

## 2019-03-10 MED ORDER — ACETAMINOPHEN 160 MG/5ML PO SOLN: 650.0000 mg | Freq: Four times a day (QID) | ORAL | Status: DC | PRN

## 2019-03-10 MED ORDER — PANTOPRAZOLE SODIUM 40 MG PO PACK
40.0000 mg | PACK | Freq: Every day | ORAL | Status: DC
  Administered 2019-03-11: 40 mg via ORAL
  Filled 2019-03-10 (×2): qty 20

## 2019-03-10 MED ORDER — ENSURE ENLIVE PO LIQD
237.0000 mL | Freq: Two times a day (BID) | ORAL | Status: DC
  Administered 2019-03-11: 237 mL via ORAL

## 2019-03-10 MED ORDER — VITAMIN B-1 100 MG PO TABS
100.0000 mg | ORAL_TABLET | Freq: Every day | ORAL | Status: DC
  Administered 2019-03-11: 100 mg via ORAL
  Filled 2019-03-10: qty 1

## 2019-03-10 MED ORDER — CLONAZEPAM 1 MG PO TABS
2.0000 mg | ORAL_TABLET | ORAL | Status: DC
  Administered 2019-03-10 – 2019-03-11 (×6): 2 mg via ORAL
  Filled 2019-03-10 (×6): qty 2

## 2019-03-10 MED ORDER — SENNOSIDES-DOCUSATE SODIUM 8.6-50 MG PO TABS: 1.0000 | ORAL_TABLET | Freq: Two times a day (BID) | ORAL | Status: DC | PRN

## 2019-03-10 MED ORDER — DOCUSATE SODIUM 50 MG/5ML PO LIQD
100.0000 mg | Freq: Two times a day (BID) | ORAL | Status: DC
  Administered 2019-03-11: 100 mg via ORAL
  Filled 2019-03-10 (×2): qty 10

## 2019-03-10 MED ORDER — METOPROLOL TARTRATE 25 MG/10 ML ORAL SUSPENSION
25.0000 mg | Freq: Two times a day (BID) | ORAL | Status: DC
  Administered 2019-03-10 – 2019-03-11 (×2): 25 mg via ORAL
  Filled 2019-03-10 (×2): qty 10

## 2019-03-10 NOTE — Progress Notes (Signed)
Patient up walking with therapy. Chest PT not performed this round. Will continue at 1600.

## 2019-03-10 NOTE — Progress Notes (Signed)
Inpatient Rehab Admissions:  Inpatient Rehab Consult received.  I met with patient at the bedside for rehabilitation assessment and to discuss goals and expectations of an inpatient rehab admission.  He initially was not interested in discussing the program, however asked for me to return and said he would like to be considered.  Pt very lethargic on my return.  I will need to confirm with patient's family that they are able to provide 24/7 supervision at discharge before I could potentially admit early next week.  I will follow up on Monday.   Signed: Shann Medal, PT, DPT Admissions Coordinator [phone] 03/10/19  5:00 PM
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
## 2019-03-10 NOTE — Progress Notes (Signed)
Patient refused CPT and breathing tx at this time.
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
## 2019-03-10 NOTE — Progress Notes (Signed)
Pt did not want abdominal dressing to be changed. Pt states "I got cleaned up this morning". RN informed pt dressing was not changed with bath and order from doctor to changed dressing. Pt still states "no". Did not escalate pt. Sitter at bedside will continue to monitor.

## 2019-03-10 NOTE — Discharge Instructions (Signed)
[facility]      [facility] °[phone] ° °OPEN ABDOMINAL SURGERY: POST OP INSTRUCTIONS ° °Always review your discharge instruction sheet given to you by the facility where your surgery was performed. ° °IF YOU HAVE DISABILITY OR FAMILY LEAVE FORMS, YOU MUST BRING THEM TO THE OFFICE FOR PROCESSING.  PLEASE DO NOT GIVE THEM TO YOUR DOCTOR. ° °1. A prescription for pain medication may be given to you upon discharge.  Take your pain medication as prescribed, if needed.  If narcotic pain medicine is not needed, then you may take acetaminophen (Tylenol) or ibuprofen (Advil) as needed. °2. Take your usually prescribed medications unless otherwise directed. °3. If you need a refill on your pain medication, please contact your pharmacy. They will contact our office to request authorization.  Prescriptions will not be filled after 5pm or on week-ends. °4. You should follow a light diet the first few days after arrival home, such as soup and crackers, pudding, etc.unless your doctor has advised otherwise. A high-fiber, low fat diet can be resumed as tolerated.   Be sure to include lots of fluids daily. Most patients will experience some swelling and bruising on the chest and neck area.  Ice packs will help.  Swelling and bruising can take several days to resolve °5. Most patients will experience some swelling and bruising in the area of the incision. Ice pack will help. Swelling and bruising can take several days to resolve..  °6. It is common to experience some constipation if taking pain medication after surgery.  Increasing fluid intake and taking a stool softener will usually help or prevent this problem from occurring.  A mild laxative (Milk of Magnesia or Miralax) should be taken according to package directions if there are no bowel movements after 48 hours. °7.  You may have steri-strips (small skin tapes) in place directly over the incision.  These strips should be left on the skin for 7-10 days.  If your  surgeon used skin glue on the incision, you may shower in 24 hours.  The glue will flake off over the next 2-3 weeks.  Any sutures or staples will be removed at the office during your follow-up visit. You may find that a light gauze bandage over your incision may keep your staples from being rubbed or pulled. You may shower and replace the bandage daily. °8. ACTIVITIES:  You may resume regular (light) daily activities beginning the next day--such as daily self-care, walking, climbing stairs--gradually increasing activities as tolerated.  You may have sexual intercourse when it is comfortable.  Refrain from any heavy lifting or straining until approved by your doctor. °a. You may drive when you no longer are taking prescription pain medication, you can comfortably wear a seatbelt, and you can safely maneuver your car and apply brakes °9. You should see your doctor in the office for a follow-up appointment approximately two weeks after your surgery.  Make sure that you call for this appointment within a day or two after you arrive home to insure a convenient appointment time. ° ° °WHEN TO CALL YOUR DOCTOR: °1. Fever over 101.0 °2. Inability to urinate °3. Nausea and/or vomiting °4. Extreme swelling or bruising °5. Continued bleeding from incision. °6. Increased pain, redness, or drainage from the incision. °7. Difficulty swallowing or breathing °8. Muscle cramping or spasms. °9. Numbness or tingling in hands or feet or around lips. ° °The clinic staff is available to answer your questions during regular business hours.  Please   dont hesitate to call and ask to speak to one of the nurses if you have concerns.  For further questions, please visit [email or web address]  MIDLINE WOUND CARE: - midline dressing to be changed twice daily - supplies: sterile saline, gauze, scissors, ABD pads, tape  - remove dressing and all packing carefully, moistening with sterile saline as needed to avoid packing/internal  dressing sticking to the wound. - clean edges of skin around the wound with water/gauze, making sure there is no tape debris or leakage left on skin that could cause skin irritation or breakdown. - dampen and clean gauze with sterile saline and pack wound from wound base to skin level, making sure to take note of any possible areas of wound tracking, tunneling and packing appropriately. Wound can be packed loosely. Trim gauze to size if a whole piece is not required. - cover wound with a dry ABD pad and secure with tape.  - write the date/time on the dry dressing/tape to better track when the last dressing change occurred. - apply any skin protectant/powder recommended by clinician to protect skin/skin folds. - change dressing as needed if leakage occurs, wound gets contaminated, or patient requests to shower. - patient may shower daily with wound open and following the shower the wound should be dried and a clean dressing placed.
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
## 2019-03-11 ENCOUNTER — Other Ambulatory Visit: Payer: Self-pay

## 2019-03-11 ENCOUNTER — Inpatient Hospital Stay (HOSPITAL_COMMUNITY)
Admission: EM | Admit: 2019-03-11 | Discharge: 2019-03-13 | DRG: 394 | Disposition: A | Discharge status: Home Health | Payer: Medicaid Other | Attending: General Surgery | Admitting: General Surgery

## 2019-03-11 ENCOUNTER — Encounter (HOSPITAL_COMMUNITY): Payer: Self-pay | Admitting: Emergency Medicine

## 2019-03-11 DIAGNOSIS — R1310 Dysphagia, unspecified: Secondary | ICD-10-CM | POA: Diagnosis present

## 2019-03-11 DIAGNOSIS — S36113S Laceration of liver, unspecified degree, sequela: Secondary | ICD-10-CM

## 2019-03-11 DIAGNOSIS — F1910 Other psychoactive substance abuse, uncomplicated: Secondary | ICD-10-CM | POA: Diagnosis present

## 2019-03-11 DIAGNOSIS — Y92410 Unspecified street and highway as the place of occurrence of the external cause: Secondary | ICD-10-CM

## 2019-03-11 DIAGNOSIS — S2242XS Multiple fractures of ribs, left side, sequela: Secondary | ICD-10-CM

## 2019-03-11 DIAGNOSIS — S36116S Major laceration of liver, sequela: Principal | ICD-10-CM

## 2019-03-11 DIAGNOSIS — Z1159 Encounter for screening for other viral diseases: Secondary | ICD-10-CM

## 2019-03-11 DIAGNOSIS — D62 Acute posthemorrhagic anemia: Secondary | ICD-10-CM | POA: Diagnosis present

## 2019-03-11 DIAGNOSIS — G8918 Other acute postprocedural pain: Secondary | ICD-10-CM

## 2019-03-11 LAB — BASIC METABOLIC PANEL
Anion gap: 16 — ABNORMAL HIGH (ref 5–15)
BUN: 12 mg/dL (ref 6–20)
CO2: 22 mmol/L (ref 22–32)
Calcium: 9.4 mg/dL (ref 8.9–10.3)
Chloride: 99 mmol/L (ref 98–111)
Creatinine, Ser: 0.78 mg/dL (ref 0.61–1.24)
GFR calc Af Amer: >60 mL/min (ref >60)
GFR calc non Af Amer: >60 mL/min (ref >60)
Glucose, Bld: 104 mg/dL — ABNORMAL HIGH (ref 70–99)
Potassium: 3.7 mmol/L (ref 3.5–5.1)
Sodium: 137 mmol/L (ref 135–145)

## 2019-03-11 LAB — CBC
HCT: 35.8 % — ABNORMAL LOW (ref 39.0–52.0)
Hemoglobin: 11.7 g/dL — ABNORMAL LOW (ref 13.0–17.0)
MCH: 30.3 pg (ref 26.0–34.0)
MCHC: 32.7 g/dL (ref 30.0–36.0)
MCV: 92.7 fL (ref 80.0–100.0)
Platelets: 761 10*3/uL — ABNORMAL HIGH (ref 150–400)
RBC: 3.86 MIL/uL — ABNORMAL LOW (ref 4.22–5.81)
RDW: 12.9 % (ref 11.5–15.5)
WBC: 7.6 10*3/uL (ref 4.0–10.5)
nRBC: 0 % (ref 0.0–0.2)

## 2019-03-11 LAB — GLUCOSE, CAPILLARY
Glucose-Capillary: 117 mg/dL — ABNORMAL HIGH (ref 70–99)
Glucose-Capillary: 117 mg/dL — ABNORMAL HIGH (ref 70–99)
Glucose-Capillary: 118 mg/dL — ABNORMAL HIGH (ref 70–99)
Glucose-Capillary: 99 mg/dL (ref 70–99)

## 2019-03-11 MED ORDER — OXYCODONE HCL 5 MG/5ML PO SOLN: 10.0000 mg | Freq: Four times a day (QID) | ORAL | Status: DC

## 2019-03-11 NOTE — ED Triage Notes (Signed)
Pt agitated in triage, able to be redirected w/ verbal de-esclation.  Pt left from hospital today AMA, came back b/c he is in great pain. C/O Abdominal/chest pain.  His mask is blood stained at the nose area, stated he started coughing a lot after he drank fluid.  Appears to be drainage around bandage on abdomen.
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
## 2019-03-12 ENCOUNTER — Emergency Department (HOSPITAL_COMMUNITY): Payer: Medicaid Other

## 2019-03-12 DIAGNOSIS — F1910 Other psychoactive substance abuse, uncomplicated: Secondary | ICD-10-CM | POA: Diagnosis present

## 2019-03-12 DIAGNOSIS — S36116S Major laceration of liver, sequela: Secondary | ICD-10-CM | POA: Diagnosis not present

## 2019-03-12 DIAGNOSIS — Z1159 Encounter for screening for other viral diseases: Secondary | ICD-10-CM | POA: Diagnosis not present

## 2019-03-12 DIAGNOSIS — R1310 Dysphagia, unspecified: Secondary | ICD-10-CM | POA: Diagnosis present

## 2019-03-12 DIAGNOSIS — Y92410 Unspecified street and highway as the place of occurrence of the external cause: Secondary | ICD-10-CM | POA: Diagnosis not present

## 2019-03-12 DIAGNOSIS — D62 Acute posthemorrhagic anemia: Secondary | ICD-10-CM | POA: Diagnosis present

## 2019-03-12 DIAGNOSIS — S36113S Laceration of liver, unspecified degree, sequela: Secondary | ICD-10-CM

## 2019-03-12 DIAGNOSIS — R109 Unspecified abdominal pain: Secondary | ICD-10-CM | POA: Diagnosis present

## 2019-03-12 DIAGNOSIS — S2242XS Multiple fractures of ribs, left side, sequela: Secondary | ICD-10-CM | POA: Diagnosis not present

## 2019-03-12 LAB — CBC WITH DIFFERENTIAL/PLATELET
Abs Immature Granulocytes: 0.11 10*3/uL — ABNORMAL HIGH (ref 0.00–0.07)
Basophils Absolute: 0.1 10*3/uL (ref 0.0–0.1)
Basophils Relative: 1 %
Eosinophils Absolute: 0.3 10*3/uL (ref 0.0–0.5)
Eosinophils Relative: 2 %
HCT: 38.7 % — ABNORMAL LOW (ref 39.0–52.0)
Hemoglobin: 12.7 g/dL — ABNORMAL LOW (ref 13.0–17.0)
Immature Granulocytes: 1 %
Lymphocytes Relative: 15 %
Lymphs Abs: 2 10*3/uL (ref 0.7–4.0)
MCH: 30.7 pg (ref 26.0–34.0)
MCHC: 32.8 g/dL (ref 30.0–36.0)
MCV: 93.5 fL (ref 80.0–100.0)
Monocytes Absolute: 0.8 10*3/uL (ref 0.1–1.0)
Monocytes Relative: 7 %
Neutro Abs: 9.5 10*3/uL — ABNORMAL HIGH (ref 1.7–7.7)
Neutrophils Relative %: 74 %
Platelets: 790 10*3/uL — ABNORMAL HIGH (ref 150–400)
RBC: 4.14 MIL/uL — ABNORMAL LOW (ref 4.22–5.81)
RDW: 13 % (ref 11.5–15.5)
WBC: 12.7 10*3/uL — ABNORMAL HIGH (ref 4.0–10.5)
nRBC: 0 % (ref 0.0–0.2)

## 2019-03-12 LAB — BASIC METABOLIC PANEL
Anion gap: 12 (ref 5–15)
BUN: 14 mg/dL (ref 6–20)
CO2: 24 mmol/L (ref 22–32)
Calcium: 9.8 mg/dL (ref 8.9–10.3)
Chloride: 101 mmol/L (ref 98–111)
Creatinine, Ser: 0.79 mg/dL (ref 0.61–1.24)
GFR calc Af Amer: >60 mL/min (ref >60)
GFR calc non Af Amer: >60 mL/min (ref >60)
Glucose, Bld: 99 mg/dL (ref 70–99)
Potassium: 3.6 mmol/L (ref 3.5–5.1)
Sodium: 137 mmol/L (ref 135–145)

## 2019-03-12 LAB — HIV ANTIBODY (ROUTINE TESTING W REFLEX): HIV Screen 4th Generation wRfx: NONREACTIVE

## 2019-03-12 LAB — SARS CORONAVIRUS 2 BY RT PCR (HOSPITAL ORDER, PERFORMED IN ~~LOC~~ HOSPITAL LAB): SARS Coronavirus 2: NEGATIVE

## 2019-03-12 MED ORDER — DEXTROSE-NACL 5-0.9 % IV SOLN
INTRAVENOUS | Status: DC
  Administered 2019-03-12 – 2019-03-13 (×3): via INTRAVENOUS

## 2019-03-12 MED ORDER — ENOXAPARIN SODIUM 40 MG/0.4ML ~~LOC~~ SOLN
40.0000 mg | SUBCUTANEOUS | Status: DC
  Administered 2019-03-12: 40 mg via SUBCUTANEOUS
  Filled 2019-03-12 (×2): qty 0.4

## 2019-03-12 MED ORDER — OXYCODONE HCL 5 MG PO TABS
5.0000 mg | ORAL_TABLET | ORAL | Status: DC | PRN
  Administered 2019-03-12 (×4): 5 mg via ORAL
  Filled 2019-03-12 (×4): qty 1

## 2019-03-12 MED ORDER — HYDRALAZINE HCL 20 MG/ML IJ SOLN: 10.0000 mg | INTRAMUSCULAR | Status: DC | PRN

## 2019-03-12 MED ORDER — CLONAZEPAM 1 MG PO TABS
1.0000 mg | ORAL_TABLET | Freq: Three times a day (TID) | ORAL | Status: DC | PRN
  Administered 2019-03-12: 1 mg via ORAL
  Filled 2019-03-12 (×2): qty 1

## 2019-03-12 MED ORDER — ONDANSETRON 4 MG PO TBDP: 4.0000 mg | ORAL_TABLET | Freq: Four times a day (QID) | ORAL | Status: DC | PRN

## 2019-03-12 MED ORDER — HYDROMORPHONE HCL 1 MG/ML IJ SOLN: 1.0000 mg | INTRAMUSCULAR | Status: DC | PRN

## 2019-03-12 MED ORDER — HYDROMORPHONE HCL 1 MG/ML IJ SOLN
1.0000 mg | Freq: Once | INTRAMUSCULAR | Status: AC
  Administered 2019-03-12: 1 mg via INTRAVENOUS
  Filled 2019-03-12: qty 1

## 2019-03-12 MED ORDER — QUETIAPINE FUMARATE 50 MG PO TABS
50.0000 mg | ORAL_TABLET | Freq: Every day | ORAL | Status: DC
  Administered 2019-03-12: 50 mg via ORAL
  Filled 2019-03-12: qty 1

## 2019-03-12 MED ORDER — ONDANSETRON HCL 4 MG/2ML IJ SOLN: 4.0000 mg | Freq: Four times a day (QID) | INTRAMUSCULAR | Status: DC | PRN
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
## 2019-03-12 NOTE — Plan of Care (Signed)
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
## 2019-03-12 NOTE — ED Notes (Signed)
ED TO INPATIENT HANDOFF REPORT  ED Nurse Name and Phone #: Raina Mina [phone]  S Name/Age/Gender Jeremy [name] 23 y.o. male Room/Bed: 016C/016C  Code Status   Code Status: Prior  Home/SNF/Other Home Patient oriented to: self, place, time and situation Is this baseline? Yes   Triage Complete: Triage complete  Chief Complaint Chest Pain  Triage Note Pt agitated in triage, able to be redirected w/ verbal de-esclation.  Pt left from hospital today AMA, came back b/c he is in great pain. C/O Abdominal/chest pain.  His mask is blood stained at the nose area, stated he started coughing a lot after he drank fluid.  Appears to be drainage around bandage on abdomen.   Allergies No Known Allergies  Level of Care/Admitting Diagnosis ED Disposition    ED Disposition Condition Comment   Admit  Hospital Area: [facility] [[id number]]  Level of Care: Med-Surg [16]  Covid Evaluation: N/A  Diagnosis: Liver laceration, closed, sequela [478295]  Admitting Physician: TRAUMA MD [[id number]]  Attending Physician: TRAUMA MD [[id number]]  Estimated length of stay: past midnight tomorrow  Certification:: I certify this patient will need inpatient services for at least 2 midnights  PT Class (Do Not Modify): Inpatient [101]  PT Acc Code (Do Not Modify): Private [1]       B Medical/Surgery History History reviewed. No pertinent past medical history. Past Surgical History:  Procedure Laterality Date  . APPLICATION OF WOUND VAC N/A 02/20/2019   Procedure: CHANGE OUT OF WOUND VAC;  Surgeon: Violeta Gelinas, MD;  Location: [facility] OR;  Service: General;  Laterality: N/A;  . LAPAROTOMY N/A 02/18/2019   Procedure: EXPLORATORY LAPAROTOMY LIVER LACERATION REPAIR, APPLICATION WOUND VAC.;  Surgeon: Harriette Bouillon, MD;  Location: [facility] OR;  Service: General;  Laterality: N/A;  5 LAPS LEFT IN ABDOMEN  . LAPAROTOMY N/A 02/20/2019   Procedure: EXPLORATORY LAPAROTOMY;  Surgeon: Violeta Gelinas, MD;  Location: [facility]  OR;  Service: General;  Laterality: N/A;  . LAPAROTOMY N/A 02/22/2019   Procedure: EXPLORATORY LAPAROTOMY WITH CLOSURE;  Surgeon: Violeta Gelinas, MD;  Location: [facility] OR;  Service: General;  Laterality: N/A;     A IV Location/Drains/Wounds Patient Lines/Drains/Airways Status   Active Line/Drains/Airways    Name:   Placement date:   Placement time:   Site:   Days:   Peripheral IV 03/12/19 Right Antecubital   03/12/19    0059    Antecubital   less than 1   Incision (Closed) 02/19/19 Sacrum Other (Comment)   02/19/19    0032     21   Incision (Closed) 02/22/19 Abdomen Other (Comment)   02/22/19    0821     18          Intake/Output Last 24 hours No intake or output data in the 24 hours ending 03/12/19 0216  Labs/Imaging Results for orders placed or performed during the hospital encounter of 03/11/19 (from the past 48 hour(s))  SARS Coronavirus 2 (CEPHEID - Performed in [facility] hospital lab), Hosp Order     Status: None   Collection Time: 03/12/19  1:03 AM  Result Value Ref Range   SARS Coronavirus 2 NEGATIVE NEGATIVE    Comment: (NOTE) If result is NEGATIVE SARS-CoV-2 target nucleic acids are NOT DETECTED. The SARS-CoV-2 RNA is generally detectable in upper and lower  respiratory specimens during the acute phase of infection. The lowest  concentration of SARS-CoV-2 viral copies this assay can detect is 250  copies / mL. A negative result does  not preclude SARS-CoV-2 infection  and should not be used as the sole basis for treatment or other  patient management decisions.  A negative result may occur with  improper specimen collection / handling, submission of specimen other  than nasopharyngeal swab, presence of viral mutation(s) within the  areas targeted by this assay, and inadequate number of viral copies  (<250 copies / mL). A negative result must be combined with clinical  observations, patient history, and epidemiological information. If result is POSITIVE SARS-CoV-2  target nucleic acids are DETECTED. The SARS-CoV-2 RNA is generally detectable in upper and lower  respiratory specimens dur ing the acute phase of infection.  Positive  results are indicative of active infection with SARS-CoV-2.  Clinical  correlation with patient history and other diagnostic information is  necessary to determine patient infection status.  Positive results do  not rule out bacterial infection or co-infection with other viruses. If result is PRESUMPTIVE POSTIVE SARS-CoV-2 nucleic acids MAY BE PRESENT.   A presumptive positive result was obtained on the submitted specimen  and confirmed on repeat testing.  While 2019 novel coronavirus  (SARS-CoV-2) nucleic acids may be present in the submitted sample  additional confirmatory testing may be necessary for epidemiological  and / or clinical management purposes  to differentiate between  SARS-CoV-2 and other Sarbecovirus currently known to infect humans.  If clinically indicated additional testing with an alternate test  methodology [phone]) is advised. The SARS-CoV-2 RNA is generally  detectable in upper and lower respiratory sp ecimens during the acute  phase of infection. The expected result is Negative. Fact Sheet for Patients:  BoilerBrush.com.cy Fact Sheet for Healthcare Providers: https://pope.com/ This test is not yet approved or cleared by the [place] FDA and has been authorized for detection and/or diagnosis of SARS-CoV-2 by FDA under an Emergency Use Authorization (EUA).  This EUA will remain in effect (meaning this test can be used) for the duration of the COVID-19 declaration under Section 564(b)(1) of the Act, 21 U.S.C. section 360bbb-3(b)(1), unless the authorization is terminated or revoked sooner. Performed at [facility], [address] [place], [place] [address]   Basic metabolic panel     Status: None   Collection Time: 03/12/19  1:09 AM   Result Value Ref Range   Sodium 137 135 - 145 mmol/L   Potassium 3.6 3.5 - 5.1 mmol/L   Chloride 101 98 - 111 mmol/L   CO2 24 22 - 32 mmol/L   Glucose, Bld 99 70 - 99 mg/dL   BUN 14 6 - 20 mg/dL   Creatinine, Ser 9.47 0.61 - 1.24 mg/dL   Calcium 9.8 8.9 - 09.6 mg/dL   GFR calc non Af Amer >60 >60 mL/min   GFR calc Af Amer >60 >60 mL/min   Anion gap 12 5 - 15    Comment: Performed at [facility], [address], [place], [place] [address]  CBC with Differential/Platelet     Status: Abnormal   Collection Time: 03/12/19  1:09 AM  Result Value Ref Range   WBC 12.7 (H) 4.0 - 10.5 K/uL   RBC 4.14 (L) 4.22 - 5.81 MIL/uL   Hemoglobin 12.7 (L) 13.0 - 17.0 g/dL   HCT 29.4 (L) 76.5 - 46.5 %   MCV 93.5 80.0 - 100.0 fL   MCH 30.7 26.0 - 34.0 pg   MCHC 32.8 30.0 - 36.0 g/dL   RDW 03.5 46.5 - 68.1 %   Platelets 790 (H) 150 -  400 K/uL   nRBC 0.0 0.0 - 0.2 %   Neutrophils Relative % 74 %   Neutro Abs 9.5 (H) 1.7 - 7.7 K/uL   Lymphocytes Relative 15 %   Lymphs Abs 2.0 0.7 - 4.0 K/uL   Monocytes Relative 7 %   Monocytes Absolute 0.8 0.1 - 1.0 K/uL   Eosinophils Relative 2 %   Eosinophils Absolute 0.3 0.0 - 0.5 K/uL   Basophils Relative 1 %   Basophils Absolute 0.1 0.0 - 0.1 K/uL   Immature Granulocytes 1 %   Abs Immature Granulocytes 0.11 (H) 0.00 - 0.07 K/uL    Comment: Performed at [facility], [address] [place], [place] [address]   Dg Chest Port 1 View  Result Date: 03/12/2019 CLINICAL DATA:  23 y/o  M; cough. EXAM: PORTABLE CHEST 1 VIEW COMPARISON:  03/07/2019 chest radiograph. FINDINGS: Stable cardiac silhouette given projection and technique. Improved aeration at the right lung base with minimal residual streaky opacities. No new consolidation, effusion, or pneumothorax. No acute osseous abnormality is evident. IMPRESSION: Improved aeration at the right lung base with minimal residual streaky opacities, probably atelectasis. No new focal consolidation.  Electronically Signed   By: Mitzi HansenLance  Furusawa-Stratton M.D.   On: 03/12/2019 01:10    Pending Labs Unresulted Labs (From admission, onward)    Start     Ordered   Signed and Held  HIV antibody (Routine Testing)  Once,   R     Signed and Held          Vitals/Pain Today's Vitals   03/12/19 0030 03/12/19 0045 03/12/19 0112 03/12/19 0123  BP: 135/76 (!) 156/96    Pulse: 85 88    Resp:      Temp:   98.3 F (36.8 C)   TempSrc:      SpO2: 93% 98%    PainSc:    6     Isolation Precautions No active isolations  Medications Medications  HYDROmorphone (DILAUDID) injection 1 mg (1 mg Intravenous Given 03/12/19 0058)    Mobility walks     Focused Assessments peripheral vascular assessment   R Recommendations: See Admitting Provider Note  Report given to:   Additional Notes:  Pt can get agitated but is usually understanding if you talk to him and explain things.

## 2019-03-12 NOTE — ED Notes (Signed)
Nurse collecting labs. 

## 2019-03-12 NOTE — ED Notes (Signed)
RN attempted to call report to 6N was told that they needed to call me back.
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
## 2019-03-13 LAB — CBC
HCT: 36.5 % — ABNORMAL LOW (ref 39.0–52.0)
Hemoglobin: 12 g/dL — ABNORMAL LOW (ref 13.0–17.0)
MCH: 30.4 pg (ref 26.0–34.0)
MCHC: 32.9 g/dL (ref 30.0–36.0)
MCV: 92.4 fL (ref 80.0–100.0)
Platelets: 715 10*3/uL — ABNORMAL HIGH (ref 150–400)
RBC: 3.95 MIL/uL — ABNORMAL LOW (ref 4.22–5.81)
RDW: 12.9 % (ref 11.5–15.5)
WBC: 7.2 10*3/uL (ref 4.0–10.5)
nRBC: 0 % (ref 0.0–0.2)

## 2019-03-13 MED ORDER — OXYCODONE HCL 5 MG PO TABS: 5.0000 mg | ORAL_TABLET | ORAL | 0 refills | Status: DC | PRN

## 2019-03-13 MED ORDER — ACETAMINOPHEN 500 MG PO TABS: 1000.0000 mg | ORAL_TABLET | Freq: Four times a day (QID) | ORAL | 2 refills | Status: AC | PRN

## 2019-03-13 MED FILL — oxyCODONE HCL 5 MG TABS: 5 | 4 days supply | Qty: 20 | Fill #0
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
## 2019-03-13 NOTE — Progress Notes (Signed)
Discharged pt to home, alert, oriented and stable. Instructions given and explained. 
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
## 2019-03-13 NOTE — Discharge Instructions (Signed)
Change midline wound dressing twice a day with saline moistened gauze and then cover with dry gauze.  Remove dressing prior to getting in the shower.     Managing Your Pain After Surgery Without Opioids    Thank you for participating in our program to help patients manage their pain after surgery without opioids. This is part of our effort to provide you with the best care possible, without exposing you or your family to the risk that opioids pose.  What pain can I expect after surgery? You can expect to have some pain after surgery. This is normal. The pain is typically worse the day after surgery, and quickly begins to get better. Many studies have found that many patients are able to manage their pain after surgery with Over-the-Counter (OTC) medications such as Tylenol and Motrin. If you have a condition that does not allow you to take Tylenol or Motrin, notify your surgical team.  How will I manage my pain? The best strategy for controlling your pain after surgery is around the clock pain control with Tylenol (acetaminophen) and Motrin (ibuprofen or Advil). Alternating these medications with each other allows you to maximize your pain control. In addition to Tylenol and Motrin, you can use heating pads or ice packs on your incisions to help reduce your pain.  How will I alternate your regular strength over-the-counter pain medication? You will take a dose of pain medication every three hours. ; Start by taking 650 mg of Tylenol (2 pills of 325 mg) ; 3 hours later take 600 mg of Motrin (3 pills of 200 mg) ; 3 hours after taking the Motrin take 650 mg of Tylenol ; 3 hours after that take 600 mg of Motrin.   - 1 -  See example - if your first dose of Tylenol is at 12:00 PM   12:00 PM Tylenol 650 mg (2 pills of 325 mg)  3:00 PM Motrin 600 mg (3 pills of 200 mg)  6:00 PM Tylenol 650 mg (2 pills of 325 mg)  9:00 PM Motrin 600 mg (3 pills of 200 mg)  Continue alternating  every 3 hours   We recommend that you follow this schedule around-the-clock for at least 3 days after surgery, or until you feel that it is no longer needed. Use the table on the last page of this handout to keep track of the medications you are taking. Important: Do not take more than 3000mg  of Tylenol or 3200mg  of Motrin in a 24-hour period. Do not take ibuprofen/Motrin if you have a history of bleeding stomach ulcers, severe kidney disease, &/or actively taking a blood thinner  What if I still have pain? If you have pain that is not controlled with the over-the-counter pain medications (Tylenol and Motrin or Advil) you might have what we call breakthrough pain. You will receive a prescription for a small amount of an opioid pain medication such as Oxycodone, Tramadol, or Tylenol with Codeine. Use these opioid pills in the first 24 hours after surgery if you have breakthrough pain. Do not take more than 1 pill every 4-6 hours.  If you still have uncontrolled pain after using all opioid pills, don't hesitate to call our staff using the number provided. We will help make sure you are managing your pain in the best way possible, and if necessary, we can provide a prescription for additional pain medication.   Day 1    Time  Name of Medication Number of pills taken  Amount of Acetaminophen  Pain Level   Comments  AM PM       AM PM       AM PM       AM PM       AM PM       AM PM       AM PM       AM PM       Total Daily amount of Acetaminophen Do not take more than  3,000 mg per day      Day 2    Time  Name of Medication Number of pills taken  Amount of Acetaminophen  Pain Level   Comments  AM PM       AM PM       AM PM       AM PM       AM PM       AM PM       AM PM       AM PM       Total Daily amount of Acetaminophen Do not take more than  3,000 mg per day      Day 3    Time  Name of Medication Number of pills taken  Amount of Acetaminophen  Pain Level    Comments  AM PM       AM PM       AM PM       AM PM          AM PM       AM PM       AM PM       AM PM       Total Daily amount of Acetaminophen Do not take more than  3,000 mg per day      Day 4    Time  Name of Medication Number of pills taken  Amount of Acetaminophen  Pain Level   Comments  AM PM       AM PM       AM PM       AM PM       AM PM       AM PM       AM PM       AM PM       Total Daily amount of Acetaminophen Do not take more than  3,000 mg per day      Day 5    Time  Name of Medication Number of pills taken  Amount of Acetaminophen  Pain Level   Comments  AM PM       AM PM       AM PM       AM PM       AM PM       AM PM       AM PM       AM PM       Total Daily amount of Acetaminophen Do not take more than  3,000 mg per day       Day 6    Time  Name of Medication Number of pills taken  Amount of Acetaminophen  Pain Level  Comments  AM PM       AM PM       AM PM       AM PM       AM PM       AM  PM       AM PM       AM PM       Total Daily amount of Acetaminophen Do not take more than  3,000 mg per day      Day 7    Time  Name of Medication Number of pills taken  Amount of Acetaminophen  Pain Level   Comments  AM PM       AM PM       AM PM       AM PM       AM PM       AM PM       AM PM       AM PM       Total Daily amount of Acetaminophen Do not take more than  3,000 mg per day        For additional information about how and where to safely dispose of unused opioid medications - [email or web address]  Disclaimer: This document contains information and/or instructional materials adapted from [facility] for the typical patient with your condition. It does not replace medical advice from your health care provider because your experience may differ from that of the typical patient. Talk to your health care provider if you have any questions about this document, your condition or your  treatment plan. Adapted from [facility], [place] [phone]  OPEN ABDOMINAL SURGERY: POST OP INSTRUCTIONS  Always review your discharge instruction sheet given to you by the facility where your surgery was performed.  IF YOU HAVE DISABILITY OR FAMILY LEAVE FORMS, YOU MUST BRING THEM TO THE OFFICE FOR PROCESSING.  PLEASE DO NOT GIVE THEM TO YOUR DOCTOR.  1. A prescription for pain medication may be given to you upon discharge.  Take your pain medication as prescribed, if needed.  If narcotic pain medicine is not needed, then you may take acetaminophen (Tylenol) or ibuprofen (Advil) as needed. 2. Take your usually prescribed medications unless otherwise directed. 3. If you need a refill on your pain medication, please contact your pharmacy. They will contact our office to request authorization.  Prescriptions will not be filled after 5pm or on week-ends. 4. You should follow a light diet the first few days after arrival home, such as soup and crackers, pudding, etc.unless your doctor has advised otherwise. A high-fiber, low fat diet can be resumed as tolerated.   Be sure to include lots of fluids daily. Most patients will experience some swelling and bruising on the chest and neck area.  Ice packs will help.  Swelling and bruising can take several days to resolve 5. Most patients will experience some swelling and bruising in the area of the incision. Ice pack will help. Swelling and bruising can take several days to resolve..  6. It is common to experience some constipation if taking pain medication after surgery.  Increasing fluid intake and taking a stool softener will usually help or prevent this problem from occurring.  A mild laxative (Milk of Magnesia or Miralax) should be taken according to package directions if there are no bowel movements after 48 hours. 7.  You may have steri-strips (small skin tapes) in place directly over the incision.  These  strips should be left on the skin for 7-10 days.  If your surgeon used skin glue on the incision, you may shower in 24 hours.  The glue will flake off over the  next 2-3 weeks.  Any sutures or staples will be removed at the office during your follow-up visit. You may find that a light gauze bandage over your incision may keep your staples from being rubbed or pulled. You may shower and replace the bandage daily. 8. ACTIVITIES:  You may resume regular (light) daily activities beginning the next day--such as daily self-care, walking, climbing stairs--gradually increasing activities as tolerated.  You may have sexual intercourse when it is comfortable.  Refrain from any heavy lifting or straining until approved by your doctor. a. You may drive when you no longer are taking prescription pain medication, you can comfortably wear a seatbelt, and you can safely maneuver your car and apply brakes b. Return to Work: ___________________________________ 9. You should see your doctor in the office for a follow-up appointment approximately two weeks after your surgery.  Make sure that you call for this appointment within a day or two after you arrive home to insure a convenient appointment time. OTHER INSTRUCTIONS:  _____________________________________________________________ _____________________________________________________________  WHEN TO CALL YOUR DOCTOR: 1. Fever over 101.0 2. Inability to urinate 3. Nausea and/or vomiting 4. Extreme swelling or bruising 5. Continued bleeding from incision. 6. Increased pain, redness, or drainage from the incision. 7. Difficulty swallowing or breathing 8. Muscle cramping or spasms. 9. Numbness or tingling in hands or feet or around lips.  The clinic staff is available to answer your questions during regular business hours.  Please dont hesitate to call and ask to speak to one of the nurses if you have concerns.  For further questions, please visit  [email or web address]

## 2019-03-14 ENCOUNTER — Encounter (HOSPITAL_COMMUNITY): Payer: Self-pay | Admitting: *Deleted

## 2019-03-29 ENCOUNTER — Emergency Department (HOSPITAL_COMMUNITY)
Admission: EM | Admit: 2019-03-29 | Discharge: 2019-03-30 | Disposition: A | Discharge status: Home / Self Care | Payer: Medicaid Other | Attending: Emergency Medicine | Admitting: Emergency Medicine

## 2019-03-29 ENCOUNTER — Other Ambulatory Visit: Payer: Self-pay

## 2019-03-29 ENCOUNTER — Encounter (HOSPITAL_COMMUNITY): Payer: Self-pay | Admitting: Emergency Medicine

## 2019-03-29 ENCOUNTER — Emergency Department (HOSPITAL_COMMUNITY): Payer: Medicaid Other

## 2019-03-29 DIAGNOSIS — Y908 Blood alcohol level of 240 mg/100 ml or more: Secondary | ICD-10-CM | POA: Diagnosis not present

## 2019-03-29 DIAGNOSIS — Z9889 Other specified postprocedural states: Secondary | ICD-10-CM | POA: Diagnosis not present

## 2019-03-29 DIAGNOSIS — F17210 Nicotine dependence, cigarettes, uncomplicated: Secondary | ICD-10-CM | POA: Diagnosis not present

## 2019-03-29 DIAGNOSIS — L929 Granulomatous disorder of the skin and subcutaneous tissue, unspecified: Secondary | ICD-10-CM | POA: Diagnosis present

## 2019-03-29 DIAGNOSIS — F10920 Alcohol use, unspecified with intoxication, uncomplicated: Secondary | ICD-10-CM

## 2019-03-29 DIAGNOSIS — T148XXD Other injury of unspecified body region, subsequent encounter: Secondary | ICD-10-CM | POA: Diagnosis not present

## 2019-03-29 LAB — COMPREHENSIVE METABOLIC PANEL
ALT: 15 U/L (ref 0–44)
AST: 17 U/L (ref 15–41)
Albumin: 4.1 g/dL (ref 3.5–5.0)
Alkaline Phosphatase: 113 U/L (ref 38–126)
Anion gap: 14 (ref 5–15)
BUN: 7 mg/dL (ref 6–20)
CO2: 20 mmol/L — ABNORMAL LOW (ref 22–32)
Calcium: 8.6 mg/dL — ABNORMAL LOW (ref 8.9–10.3)
Chloride: 109 mmol/L (ref 98–111)
Creatinine, Ser: 1.08 mg/dL (ref 0.61–1.24)
GFR calc Af Amer: >60 mL/min (ref >60)
GFR calc non Af Amer: >60 mL/min (ref >60)
Glucose, Bld: 102 mg/dL — ABNORMAL HIGH (ref 70–99)
Potassium: 3.5 mmol/L (ref 3.5–5.1)
Sodium: 143 mmol/L (ref 135–145)
Total Bilirubin: 0.4 mg/dL (ref 0.3–1.2)
Total Protein: 7.4 g/dL (ref 6.5–8.1)

## 2019-03-29 LAB — URINALYSIS, ROUTINE W REFLEX MICROSCOPIC
Bilirubin Urine: NEGATIVE
Glucose, UA: NEGATIVE mg/dL
Hgb urine dipstick: NEGATIVE
Ketones, ur: NEGATIVE mg/dL
Leukocytes,Ua: NEGATIVE
Nitrite: NEGATIVE
Protein, ur: NEGATIVE mg/dL
Specific Gravity, Urine: 1.003 — ABNORMAL LOW (ref 1.005–1.030)
pH: 6 (ref 5.0–8.0)

## 2019-03-29 LAB — CBC WITH DIFFERENTIAL/PLATELET
Abs Immature Granulocytes: 0.02 10*3/uL (ref 0.00–0.07)
Basophils Absolute: 0 10*3/uL (ref 0.0–0.1)
Basophils Relative: 1 %
Eosinophils Absolute: 0.2 10*3/uL (ref 0.0–0.5)
Eosinophils Relative: 3 %
HCT: 45.5 % (ref 39.0–52.0)
Hemoglobin: 14.3 g/dL (ref 13.0–17.0)
Immature Granulocytes: 0 %
Lymphocytes Relative: 33 %
Lymphs Abs: 1.9 10*3/uL (ref 0.7–4.0)
MCH: 30.5 pg (ref 26.0–34.0)
MCHC: 31.4 g/dL (ref 30.0–36.0)
MCV: 97 fL (ref 80.0–100.0)
Monocytes Absolute: 0.3 10*3/uL (ref 0.1–1.0)
Monocytes Relative: 6 %
Neutro Abs: 3.4 10*3/uL (ref 1.7–7.7)
Neutrophils Relative %: 57 %
Platelets: 242 10*3/uL (ref 150–400)
RBC: 4.69 MIL/uL (ref 4.22–5.81)
RDW: 14.8 % (ref 11.5–15.5)
WBC: 5.9 10*3/uL (ref 4.0–10.5)
nRBC: 0 % (ref 0.0–0.2)

## 2019-03-29 LAB — RAPID URINE DRUG SCREEN, HOSP PERFORMED
Amphetamines: NOT DETECTED
Barbiturates: NOT DETECTED
Benzodiazepines: NOT DETECTED
Cocaine: NOT DETECTED
Opiates: NOT DETECTED
Tetrahydrocannabinol: POSITIVE — AB

## 2019-03-29 LAB — LACTIC ACID, PLASMA
Lactic Acid, Venous: 2.4 mmol/L (ref 0.5–1.9)
Lactic Acid, Venous: 2.1 mmol/L (ref 0.5–1.9)
Lactic Acid, Venous: 1.7 mmol/L (ref 0.5–1.9)

## 2019-03-29 LAB — SALICYLATE LEVEL: Salicylate Lvl: <7 mg/dL (ref 2.8–30.0)

## 2019-03-29 LAB — ACETAMINOPHEN LEVEL: Acetaminophen (Tylenol), Serum: <10 ug/mL — ABNORMAL LOW (ref 10–30)

## 2019-03-29 LAB — ETHANOL: Alcohol, Ethyl (B): 281 mg/dL — ABNORMAL HIGH (ref <10)

## 2019-03-29 MED ORDER — SODIUM CHLORIDE 0.9 % IV BOLUS
1000.0000 mL | Freq: Once | INTRAVENOUS | Status: AC
  Administered 2019-03-29: 1000 mL via INTRAVENOUS

## 2019-03-29 MED ORDER — THIAMINE HCL 100 MG/ML IJ SOLN
100.0000 mg | Freq: Once | INTRAMUSCULAR | Status: AC
  Administered 2019-03-29: 100 mg via INTRAVENOUS
  Filled 2019-03-29: qty 2

## 2019-03-29 MED ORDER — FOLIC ACID 1 MG PO TABS
1.0000 mg | ORAL_TABLET | Freq: Once | ORAL | Status: AC
  Administered 2019-03-29: 1 mg via ORAL
  Filled 2019-03-29: qty 1

## 2019-03-29 NOTE — ED Notes (Signed)
Provided patient with water and crackers/peanut butter. Will ambulate after patient eats.
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
## 2019-03-29 NOTE — ED Notes (Signed)
Patient ambulated in hall with minimal assistance.

## 2019-03-29 NOTE — ED Notes (Signed)
Bed: AO13 Expected date:  Expected time:  Means of arrival:  Comments: EMS 23yo M Surgical Site Complication

## 2019-03-29 NOTE — ED Notes (Signed)
ED Provider at bedside. 

## 2019-03-29 NOTE — Discharge Instructions (Addendum)
You have been diagnosed today with delayed wound healing with granulation tissue, alcohol intoxication.  At this time there does not appear to be the presence of an emergent medical condition, however there is always the potential for conditions to change. Please read and follow the below instructions.  Please return to the Emergency Department immediately for any new or worsening symptoms. Please be sure to follow up with your Primary Care Provider within one week regarding your visit today; please call their office to schedule an appointment even if you are feeling better for a follow-up visit. The area at the bottom of your stomach incision does not appear infected today, this appears as granulation tissue which can happen with delayed healing.  No antibiotics are indicated at this time.  Please follow-up with your primary care provider next week for recheck.  Please continue to wash the area gently with soap and water daily and cover with clean and sterile bandages.  Return to the emergency department for any new or worsening symptoms.  Get help right away if: You have a red streak going away from your wound. The edges of the wound open up and separate. Your wound is bleeding, and the bleeding does not stop with gentle pressure. You have a rash. You faint. You have trouble breathing. You have fever/chills You have pus or your wound opens up Any new/concerning or worsening symptoms Get help right away if: You have any of the following: Moderate to severe trouble with coordination, speech, memory, or attention. Trouble staying awake. Severe confusion. A seizure. Light-headedness. Fainting. Vomiting bright red blood or material that looks like coffee grounds. Bloody stool (feces). The blood may make your stool bright red, black, or tarry. It may also smell bad. Shakiness when trying to stop drinking. Thoughts about hurting yourself or others.  Please read the additional information  packets attached to your discharge summary.  Do not take your medicine if  develop an itchy rash, swelling in your mouth or lips, or difficulty breathing.

## 2019-03-29 NOTE — ED Notes (Signed)
Date and time results received: 03/29/19 2132  Test: Lactic Acid  Critical Value: 2.1  Name of Provider Notified: Apolinar Junes, [place].   Orders Received? Or Actions Taken?: Will continue to monitor patient and await new orders.

## 2019-03-29 NOTE — ED Notes (Signed)
Patient transported to X-ray 

## 2019-03-29 NOTE — ED Provider Notes (Signed)
10:14 PM Handoff from [facility] at shift change. Pt is intoxicated. Midline wound from previous surgery does not appear grossly infected. He is awaiting repeat lactic acid. Anticipate d/c when sober.   BP 109/70 (BP Location: Right Arm)   Pulse 81   Temp 98.3 F (36.8 C) (Oral)   Resp 18   SpO2 95%   12:53 AM Patient ambulated safely. Repeat lactate 1.7. Ready for d/c.   BP (!) 110/52 (BP Location: Left Arm)   Pulse 83   Temp 98.3 F (36.8 C) (Oral)   Resp 17   SpO2 95%     Renne Crigler, PA-C 03/30/19 0053    Loren Racer, MD 04/03/19 [phone]

## 2019-03-29 NOTE — ED Notes (Signed)
Date and time results received: 03/29/19   Test: Lactic Acid Critical Value: 2.4  Name of Provider Notified: Dr. Berlinda Last   Orders Received? Or Actions Taken?: Will continue to monitor and await new orders.

## 2019-03-30 NOTE — ED Notes (Signed)
Pt verbalized discharge instructions and follow up care. Alert and ambulatory. No IV. Wound redressed

## 2019-04-01 LAB — AEROBIC CULTURE W GRAM STAIN (SUPERFICIAL SPECIMEN)
Gram Stain: NONE SEEN
Special Requests: NORMAL

## 2019-04-01 LAB — AEROBIC CULTURE? (SUPERFICIAL SPECIMEN)

## 2019-04-02 ENCOUNTER — Telehealth: Payer: Self-pay | Admitting: Emergency Medicine

## 2019-04-02 NOTE — Telephone Encounter (Signed)
Post ED Visit - Positive Culture Follow-up  Culture report reviewed by antimicrobial stewardship pharmacist: [facility] []  Enzo Bi, Pharm.D. []  Celedonio Miyamoto, Pharm.D., BCPS AQ-ID []  Garvin Fila, Pharm.D., BCPS []  [place]ina Pillion, Pharm.D., BCPS []  [place], [address].D., BCPS, AAHIVP []  Estella Husk, Pharm.D., BCPS, AAHIVP []  Lysle Pearl, PharmD, BCPS []  Phillips Climes, PharmD, BCPS []  Agapito Games, PharmD, BCPS []  Verlan Friends, PharmD []  Mervyn Gay, PharmD, BCPS []  Vinnie Level, PharmD  [facility] []  Len Childs, PharmD []  Greer Pickerel, PharmD []  Adalberto Cole, PharmD []  Perlie Gold, Rph []  Lonell Face) Jean Rosenthal, PharmD []  Earl Many, PharmD []  Junita Push, PharmD []  Dorna Leitz, PharmD []  Terrilee Files, PharmD []  Lynann Beaver, PharmD []  Keturah Barre, PharmD []  Loralee Pacas, PharmD [x]  Bernadene Person, PharmD   Positive Aerobic culture Likely contaminate, no further patient follow-up is required at this time.  Carollee Herter Kristie Bracewell 04/02/2019, 4:24 PM

## 2019-04-04 ENCOUNTER — Inpatient Hospital Stay (HOSPITAL_COMMUNITY)
Admission: EM | Admit: 2019-04-04 | Discharge: 2019-04-12 | DRG: 956 | Disposition: A | Discharge status: Rehabilitation Facility | Payer: Medicaid Other | Attending: Physician Assistant | Admitting: Physician Assistant

## 2019-04-04 ENCOUNTER — Emergency Department (HOSPITAL_COMMUNITY): Payer: Medicaid Other

## 2019-04-04 DIAGNOSIS — R410 Disorientation, unspecified: Secondary | ICD-10-CM | POA: Diagnosis not present

## 2019-04-04 DIAGNOSIS — T148XXA Other injury of unspecified body region, initial encounter: Secondary | ICD-10-CM | POA: Diagnosis not present

## 2019-04-04 DIAGNOSIS — S72302A Unspecified fracture of shaft of left femur, initial encounter for closed fracture: Secondary | ICD-10-CM | POA: Diagnosis present

## 2019-04-04 DIAGNOSIS — I10 Essential (primary) hypertension: Secondary | ICD-10-CM | POA: Diagnosis not present

## 2019-04-04 DIAGNOSIS — M7989 Other specified soft tissue disorders: Secondary | ICD-10-CM | POA: Diagnosis not present

## 2019-04-04 DIAGNOSIS — S065X9A Traumatic subdural hemorrhage with loss of consciousness of unspecified duration, initial encounter: Secondary | ICD-10-CM | POA: Diagnosis present

## 2019-04-04 DIAGNOSIS — F1010 Alcohol abuse, uncomplicated: Secondary | ICD-10-CM | POA: Diagnosis not present

## 2019-04-04 DIAGNOSIS — S069X1A Unspecified intracranial injury with loss of consciousness of 30 minutes or less, initial encounter: Secondary | ICD-10-CM | POA: Diagnosis not present

## 2019-04-04 DIAGNOSIS — T380X5A Adverse effect of glucocorticoids and synthetic analogues, initial encounter: Secondary | ICD-10-CM

## 2019-04-04 DIAGNOSIS — S27321A Contusion of lung, unilateral, initial encounter: Secondary | ICD-10-CM | POA: Diagnosis present

## 2019-04-04 DIAGNOSIS — S36115A Moderate laceration of liver, initial encounter: Secondary | ICD-10-CM | POA: Diagnosis present

## 2019-04-04 DIAGNOSIS — S7292XA Unspecified fracture of left femur, initial encounter for closed fracture: Secondary | ICD-10-CM | POA: Diagnosis present

## 2019-04-04 DIAGNOSIS — S2242XA Multiple fractures of ribs, left side, initial encounter for closed fracture: Secondary | ICD-10-CM | POA: Diagnosis present

## 2019-04-04 DIAGNOSIS — S069X0S Unspecified intracranial injury without loss of consciousness, sequela: Secondary | ICD-10-CM | POA: Diagnosis not present

## 2019-04-04 DIAGNOSIS — E876 Hypokalemia: Secondary | ICD-10-CM | POA: Diagnosis not present

## 2019-04-04 DIAGNOSIS — T1490XA Injury, unspecified, initial encounter: Secondary | ICD-10-CM | POA: Diagnosis not present

## 2019-04-04 DIAGNOSIS — Z20828 Contact with and (suspected) exposure to other viral communicable diseases: Secondary | ICD-10-CM | POA: Diagnosis present

## 2019-04-04 DIAGNOSIS — R402312 Coma scale, best motor response, none, at arrival to emergency department: Secondary | ICD-10-CM | POA: Diagnosis present

## 2019-04-04 DIAGNOSIS — R451 Restlessness and agitation: Secondary | ICD-10-CM | POA: Diagnosis not present

## 2019-04-04 DIAGNOSIS — S7222XA Displaced subtrochanteric fracture of left femur, initial encounter for closed fracture: Principal | ICD-10-CM | POA: Diagnosis present

## 2019-04-04 DIAGNOSIS — R0682 Tachypnea, not elsewhere classified: Secondary | ICD-10-CM

## 2019-04-04 DIAGNOSIS — S069X9A Unspecified intracranial injury with loss of consciousness of unspecified duration, initial encounter: Secondary | ICD-10-CM | POA: Diagnosis not present

## 2019-04-04 DIAGNOSIS — Y92410 Unspecified street and highway as the place of occurrence of the external cause: Secondary | ICD-10-CM | POA: Diagnosis not present

## 2019-04-04 DIAGNOSIS — S12101A Unspecified nondisplaced fracture of second cervical vertebra, initial encounter for closed fracture: Secondary | ICD-10-CM | POA: Diagnosis not present

## 2019-04-04 DIAGNOSIS — S12100A Unspecified displaced fracture of second cervical vertebra, initial encounter for closed fracture: Secondary | ICD-10-CM

## 2019-04-04 DIAGNOSIS — R739 Hyperglycemia, unspecified: Secondary | ICD-10-CM

## 2019-04-04 DIAGNOSIS — S72352A Displaced comminuted fracture of shaft of left femur, initial encounter for closed fracture: Secondary | ICD-10-CM | POA: Diagnosis not present

## 2019-04-04 DIAGNOSIS — J9601 Acute respiratory failure with hypoxia: Secondary | ICD-10-CM | POA: Diagnosis present

## 2019-04-04 DIAGNOSIS — S270XXA Traumatic pneumothorax, initial encounter: Secondary | ICD-10-CM | POA: Diagnosis present

## 2019-04-04 DIAGNOSIS — S069X3S Unspecified intracranial injury with loss of consciousness of 1 hour to 5 hours 59 minutes, sequela: Secondary | ICD-10-CM | POA: Diagnosis not present

## 2019-04-04 DIAGNOSIS — Z720 Tobacco use: Secondary | ICD-10-CM | POA: Diagnosis not present

## 2019-04-04 DIAGNOSIS — R402112 Coma scale, eyes open, never, at arrival to emergency department: Secondary | ICD-10-CM | POA: Diagnosis present

## 2019-04-04 DIAGNOSIS — Z419 Encounter for procedure for purposes other than remedying health state, unspecified: Secondary | ICD-10-CM

## 2019-04-04 DIAGNOSIS — R402212 Coma scale, best verbal response, none, at arrival to emergency department: Secondary | ICD-10-CM | POA: Diagnosis present

## 2019-04-04 DIAGNOSIS — S27329A Contusion of lung, unspecified, initial encounter: Secondary | ICD-10-CM

## 2019-04-04 DIAGNOSIS — I824Z1 Acute embolism and thrombosis of unspecified deep veins of right distal lower extremity: Secondary | ICD-10-CM | POA: Diagnosis not present

## 2019-04-04 DIAGNOSIS — F17210 Nicotine dependence, cigarettes, uncomplicated: Secondary | ICD-10-CM | POA: Diagnosis present

## 2019-04-04 DIAGNOSIS — S065X9D Traumatic subdural hemorrhage with loss of consciousness of unspecified duration, subsequent encounter: Secondary | ICD-10-CM | POA: Diagnosis not present

## 2019-04-04 DIAGNOSIS — S066X9A Traumatic subarachnoid hemorrhage with loss of consciousness of unspecified duration, initial encounter: Secondary | ICD-10-CM | POA: Diagnosis present

## 2019-04-04 DIAGNOSIS — Z1159 Encounter for screening for other viral diseases: Secondary | ICD-10-CM

## 2019-04-04 DIAGNOSIS — D62 Acute posthemorrhagic anemia: Secondary | ICD-10-CM

## 2019-04-04 DIAGNOSIS — S0990XA Unspecified injury of head, initial encounter: Secondary | ICD-10-CM

## 2019-04-04 DIAGNOSIS — R4689 Other symptoms and signs involving appearance and behavior: Secondary | ICD-10-CM | POA: Diagnosis not present

## 2019-04-04 LAB — PREPARE FRESH FROZEN PLASMA: Unit division: 0

## 2019-04-04 LAB — POCT I-STAT 7, (LYTES, BLD GAS, ICA,H+H)
Acid-base deficit: 4 mmol/L — ABNORMAL HIGH (ref 0.0–2.0)
Bicarbonate: 21 mmol/L (ref 20.0–28.0)
Calcium, Ion: 1.06 mmol/L — ABNORMAL LOW (ref 1.15–1.40)
HCT: 37 % — ABNORMAL LOW (ref 39.0–52.0)
Hemoglobin: 12.6 g/dL — ABNORMAL LOW (ref 13.0–17.0)
O2 Saturation: 100 %
Patient temperature: 98.6
Potassium: 3.3 mmol/L — ABNORMAL LOW (ref 3.5–5.1)
Sodium: 138 mmol/L (ref 135–145)
TCO2: 22 mmol/L (ref 22–32)
pCO2 arterial: 39.4 mmHg (ref 32.0–48.0)
pH, Arterial: 7.335 — ABNORMAL LOW (ref 7.350–7.450)
pO2, Arterial: 305 mmHg — ABNORMAL HIGH (ref 83.0–108.0)

## 2019-04-04 LAB — TYPE AND SCREEN
ABO/RH(D): A POS
Antibody Screen: NEGATIVE
Unit division: 0
Unit division: 0

## 2019-04-04 LAB — I-STAT CHEM 8, ED
BUN: 5 mg/dL — ABNORMAL LOW (ref 6–20)
Calcium, Ion: 0.94 mmol/L — ABNORMAL LOW (ref 1.15–1.40)
Chloride: 106 mmol/L (ref 98–111)
Creatinine, Ser: 1.4 mg/dL — ABNORMAL HIGH (ref 0.61–1.24)
Glucose, Bld: 133 mg/dL — ABNORMAL HIGH (ref 70–99)
HCT: 47 % (ref 39.0–52.0)
Hemoglobin: 16 g/dL (ref 13.0–17.0)
Potassium: 3.6 mmol/L (ref 3.5–5.1)
Sodium: 141 mmol/L (ref 135–145)
TCO2: 19 mmol/L — ABNORMAL LOW (ref 22–32)

## 2019-04-04 LAB — COMPREHENSIVE METABOLIC PANEL
ALT: 39 U/L (ref 0–44)
AST: 88 U/L — ABNORMAL HIGH (ref 15–41)
Albumin: 3.8 g/dL (ref 3.5–5.0)
Alkaline Phosphatase: 114 U/L (ref 38–126)
Anion gap: 14 (ref 5–15)
BUN: 5 mg/dL — ABNORMAL LOW (ref 6–20)
CO2: 19 mmol/L — ABNORMAL LOW (ref 22–32)
Calcium: 8.5 mg/dL — ABNORMAL LOW (ref 8.9–10.3)
Chloride: 107 mmol/L (ref 98–111)
Creatinine, Ser: 1 mg/dL (ref 0.61–1.24)
GFR calc Af Amer: >60 mL/min (ref >60)
GFR calc non Af Amer: >60 mL/min (ref >60)
Glucose, Bld: 142 mg/dL — ABNORMAL HIGH (ref 70–99)
Potassium: 3.5 mmol/L (ref 3.5–5.1)
Sodium: 140 mmol/L (ref 135–145)
Total Bilirubin: 0.5 mg/dL (ref 0.3–1.2)
Total Protein: 6.9 g/dL (ref 6.5–8.1)

## 2019-04-04 LAB — CBC
HCT: 45 % (ref 39.0–52.0)
Hemoglobin: 14.8 g/dL (ref 13.0–17.0)
MCH: 31.6 pg (ref 26.0–34.0)
MCHC: 32.9 g/dL (ref 30.0–36.0)
MCV: 95.9 fL (ref 80.0–100.0)
Platelets: 228 10*3/uL (ref 150–400)
RBC: 4.69 MIL/uL (ref 4.22–5.81)
RDW: 14.9 % (ref 11.5–15.5)
WBC: 13.7 10*3/uL — ABNORMAL HIGH (ref 4.0–10.5)
nRBC: 0.1 % (ref 0.0–0.2)

## 2019-04-04 LAB — BPAM RBC
Blood Product Expiration Date: 202006112359
Blood Product Expiration Date: 202006232359
ISSUE DATE / TIME: 202006022123
ISSUE DATE / TIME: 202006022123
Unit Type and Rh: 9500
Unit Type and Rh: 9500

## 2019-04-04 LAB — BPAM FFP
Blood Product Expiration Date: 202006072359
Blood Product Expiration Date: 202006072359
ISSUE DATE / TIME: 202006022123
ISSUE DATE / TIME: 202006022123
Unit Type and Rh: 6200
Unit Type and Rh: 6200

## 2019-04-04 LAB — PROTIME-INR
INR: 1.1 (ref 0.8–1.2)
Prothrombin Time: 14.3 seconds (ref 11.4–15.2)

## 2019-04-04 LAB — ETHANOL: Alcohol, Ethyl (B): 292 mg/dL — ABNORMAL HIGH (ref <10)

## 2019-04-04 LAB — LACTIC ACID, PLASMA: Lactic Acid, Venous: 4.2 mmol/L (ref 0.5–1.9)

## 2019-04-04 LAB — CDS SEROLOGY

## 2019-04-04 MED ORDER — ORAL CARE MOUTH RINSE
15.0000 mL | OROMUCOSAL | Status: DC
  Administered 2019-04-05 – 2019-04-06 (×16): 15 mL via OROMUCOSAL

## 2019-04-04 MED ORDER — PROPOFOL 1000 MG/100ML IV EMUL
0.0000 ug/kg/min | INTRAVENOUS | Status: DC
  Administered 2019-04-04: 30 ug/kg/min via INTRAVENOUS
  Administered 2019-04-05: 40 ug/kg/min via INTRAVENOUS
  Administered 2019-04-05: 25 ug/kg/min via INTRAVENOUS
  Administered 2019-04-05 – 2019-04-06 (×2): 30 ug/kg/min via INTRAVENOUS
  Filled 2019-04-04 (×4): qty 100

## 2019-04-04 MED ORDER — SODIUM CHLORIDE 0.9 % IV SOLN
INTRAVENOUS | Status: DC
  Administered 2019-04-06 – 2019-04-07 (×2): via INTRAVENOUS

## 2019-04-04 MED ORDER — PANTOPRAZOLE SODIUM 40 MG IV SOLR
40.0000 mg | Freq: Every day | INTRAVENOUS | Status: DC
  Administered 2019-04-05: 40 mg via INTRAVENOUS
  Filled 2019-04-04 (×2): qty 40

## 2019-04-04 MED ORDER — SODIUM CHLORIDE 0.9 % IV SOLN
INTRAVENOUS | Status: AC | PRN
  Administered 2019-04-04: 1000 mL via INTRAVENOUS

## 2019-04-04 MED ORDER — FENTANYL 2500MCG IN NS 250ML (10MCG/ML) PREMIX INFUSION
50.0000 ug/h | INTRAVENOUS | Status: DC
  Administered 2019-04-04 – 2019-04-06 (×3): 150 ug/h via INTRAVENOUS
  Filled 2019-04-04 (×5): qty 250

## 2019-04-04 MED ORDER — HYDRALAZINE HCL 20 MG/ML IJ SOLN: 10.0000 mg | INTRAMUSCULAR | Status: DC | PRN

## 2019-04-04 MED ORDER — CHLORHEXIDINE GLUCONATE CLOTH 2 % EX PADS
6.0000 | MEDICATED_PAD | Freq: Every day | CUTANEOUS | Status: DC
  Administered 2019-04-05 – 2019-04-06 (×2): 6 via TOPICAL

## 2019-04-04 MED ORDER — PANTOPRAZOLE SODIUM 40 MG PO TBEC: 40.0000 mg | DELAYED_RELEASE_TABLET | Freq: Every day | ORAL | Status: DC

## 2019-04-04 MED ORDER — PROPOFOL 1000 MG/100ML IV EMUL
INTRAVENOUS | Status: AC
  Filled 2019-04-04: qty 100

## 2019-04-04 MED ORDER — ONDANSETRON 4 MG PO TBDP
4.0000 mg | ORAL_TABLET | Freq: Four times a day (QID) | ORAL | Status: DC | PRN
  Filled 2019-04-04: qty 1

## 2019-04-04 MED ORDER — CHLORHEXIDINE GLUCONATE 0.12% ORAL RINSE (MEDLINE KIT)
15.0000 mL | Freq: Two times a day (BID) | OROMUCOSAL | Status: DC
  Administered 2019-04-04 – 2019-04-06 (×5): 15 mL via OROMUCOSAL

## 2019-04-04 MED ORDER — FENTANYL CITRATE (PF) 100 MCG/2ML IJ SOLN: 50.0000 ug | Freq: Once | INTRAMUSCULAR | Status: DC

## 2019-04-04 MED ORDER — ONDANSETRON HCL 4 MG/2ML IJ SOLN
4.0000 mg | Freq: Four times a day (QID) | INTRAMUSCULAR | Status: DC | PRN
  Administered 2019-04-09 – 2019-04-12 (×3): 4 mg via INTRAVENOUS
  Filled 2019-04-04 (×3): qty 2

## 2019-04-04 MED ORDER — SODIUM CHLORIDE 0.9 % IV BOLUS
1000.0000 mL | Freq: Once | INTRAVENOUS | Status: AC
  Administered 2019-04-04: 1000 mL via INTRAVENOUS

## 2019-04-04 MED ORDER — IOHEXOL 300 MG/ML  SOLN
100.0000 mL | Freq: Once | INTRAMUSCULAR | Status: AC | PRN
  Administered 2019-04-04: 100 mL via INTRAVENOUS

## 2019-04-04 MED ORDER — FENTANYL BOLUS VIA INFUSION
50.0000 ug | INTRAVENOUS | Status: DC | PRN
  Filled 2019-04-04: qty 50

## 2019-04-04 MED ORDER — SUCCINYLCHOLINE CHLORIDE 20 MG/ML IJ SOLN
INTRAMUSCULAR | Status: AC | PRN
  Administered 2019-04-04: 120 mg via INTRAVENOUS

## 2019-04-04 MED ORDER — ETOMIDATE 2 MG/ML IV SOLN
INTRAVENOUS | Status: AC | PRN
  Administered 2019-04-04: 20 mg via INTRAVENOUS

## 2019-04-04 MED ORDER — POTASSIUM CHLORIDE IN NACL 20-0.9 MEQ/L-% IV SOLN
INTRAVENOUS | Status: DC
  Administered 2019-04-04 – 2019-04-06 (×4): via INTRAVENOUS
  Filled 2019-04-04 (×5): qty 1000
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
## 2019-04-04 NOTE — ED Notes (Signed)
Pt to ct with resp and primary RN
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
## 2019-04-04 NOTE — Progress Notes (Signed)
Orthopedic Tech Progress Note Patient Details:  Jeremy [name] 11/02/1875 [id number]      Post Interventions Patient Tolerated: Well Instructions Provided: Adjustment of device, Care of device   Norva Karvonen T 04/04/2019, 9:57 PM

## 2019-04-05 ENCOUNTER — Inpatient Hospital Stay (HOSPITAL_COMMUNITY): Payer: Medicaid Other

## 2019-04-05 ENCOUNTER — Encounter (HOSPITAL_COMMUNITY): Payer: Self-pay | Admitting: Certified Registered Nurse Anesthetist

## 2019-04-05 ENCOUNTER — Inpatient Hospital Stay (HOSPITAL_COMMUNITY): Payer: Medicaid Other | Admitting: Certified Registered Nurse Anesthetist

## 2019-04-05 ENCOUNTER — Encounter (HOSPITAL_COMMUNITY): Admission: EM | Disposition: A | Discharge status: Rehabilitation Facility | Payer: Self-pay | Source: Home / Self Care

## 2019-04-05 HISTORY — PX: FEMUR IM NAIL: SHX1597

## 2019-04-05 LAB — URINALYSIS, ROUTINE W REFLEX MICROSCOPIC
Bacteria, UA: NONE SEEN
Bilirubin Urine: NEGATIVE
Glucose, UA: NEGATIVE mg/dL
Ketones, ur: NEGATIVE mg/dL
Leukocytes,Ua: NEGATIVE
Nitrite: NEGATIVE
Protein, ur: NEGATIVE mg/dL
Specific Gravity, Urine: >1.046 — ABNORMAL HIGH (ref 1.005–1.030)
pH: 5 (ref 5.0–8.0)

## 2019-04-05 LAB — CBC
HCT: 40.9 % (ref 39.0–52.0)
Hemoglobin: 13.1 g/dL (ref 13.0–17.0)
MCH: 30.8 pg (ref 26.0–34.0)
MCHC: 32 g/dL (ref 30.0–36.0)
MCV: 96.2 fL (ref 80.0–100.0)
Platelets: 158 10*3/uL (ref 150–400)
RBC: 4.25 MIL/uL (ref 4.22–5.81)
RDW: 15.3 % (ref 11.5–15.5)
WBC: 9.8 10*3/uL (ref 4.0–10.5)
nRBC: 0 % (ref 0.0–0.2)

## 2019-04-05 LAB — BASIC METABOLIC PANEL
Anion gap: 13 (ref 5–15)
BUN: 5 mg/dL — ABNORMAL LOW (ref 6–20)
CO2: 19 mmol/L — ABNORMAL LOW (ref 22–32)
Calcium: 8.7 mg/dL — ABNORMAL LOW (ref 8.9–10.3)
Chloride: 109 mmol/L (ref 98–111)
Creatinine, Ser: 0.82 mg/dL (ref 0.61–1.24)
GFR calc Af Amer: >60 mL/min (ref >60)
GFR calc non Af Amer: >60 mL/min (ref >60)
Glucose, Bld: 117 mg/dL — ABNORMAL HIGH (ref 70–99)
Potassium: 5 mmol/L (ref 3.5–5.1)
Sodium: 141 mmol/L (ref 135–145)

## 2019-04-05 LAB — ABO/RH: ABO/RH(D): A POS

## 2019-04-05 LAB — TRIGLYCERIDES: Triglycerides: 79 mg/dL (ref <150)

## 2019-04-05 LAB — BLOOD PRODUCT ORDER (VERBAL) VERIFICATION

## 2019-04-05 LAB — HIV ANTIBODY (ROUTINE TESTING W REFLEX): HIV Screen 4th Generation wRfx: NONREACTIVE

## 2019-04-05 LAB — LACTIC ACID, PLASMA: Lactic Acid, Venous: 1.4 mmol/L (ref 0.5–1.9)

## 2019-04-05 LAB — MRSA PCR SCREENING: MRSA by PCR: NEGATIVE

## 2019-04-05 LAB — SARS CORONAVIRUS 2 BY RT PCR (HOSPITAL ORDER, PERFORMED IN ~~LOC~~ HOSPITAL LAB): SARS Coronavirus 2: NEGATIVE

## 2019-04-05 SURGERY — INSERTION, INTRAMEDULLARY ROD, FEMUR
Anesthesia: General | Laterality: Left

## 2019-04-05 MED ORDER — 0.9 % SODIUM CHLORIDE (POUR BTL) OPTIME
TOPICAL | Status: DC | PRN
  Administered 2019-04-05: 1000 mL

## 2019-04-05 MED ORDER — CEFAZOLIN SODIUM-DEXTROSE 2-3 GM-%(50ML) IV SOLR
INTRAVENOUS | Status: DC | PRN
  Administered 2019-04-05: 2 g via INTRAVENOUS

## 2019-04-05 MED ORDER — CEFAZOLIN SODIUM-DEXTROSE 2-4 GM/100ML-% IV SOLN
2.0000 g | Freq: Three times a day (TID) | INTRAVENOUS | Status: AC
  Administered 2019-04-05 – 2019-04-06 (×3): 2 g via INTRAVENOUS
  Filled 2019-04-05 (×3): qty 100

## 2019-04-05 MED ORDER — VANCOMYCIN HCL 500 MG IV SOLR
INTRAVENOUS | Status: AC
  Filled 2019-04-05: qty 500

## 2019-04-05 MED ORDER — LACTATED RINGERS IV SOLN
INTRAVENOUS | Status: DC | PRN
  Administered 2019-04-05: 12:00:00 via INTRAVENOUS

## 2019-04-05 MED ORDER — ROCURONIUM BROMIDE 100 MG/10ML IV SOLN
INTRAVENOUS | Status: DC | PRN
  Administered 2019-04-05: 30 mg via INTRAVENOUS
  Administered 2019-04-05: 20 mg via INTRAVENOUS
  Administered 2019-04-05: 50 mg via INTRAVENOUS
  Administered 2019-04-05: 30 mg via INTRAVENOUS

## 2019-04-05 MED ORDER — FENTANYL CITRATE (PF) 100 MCG/2ML IJ SOLN
INTRAMUSCULAR | Status: DC | PRN
  Administered 2019-04-05: 50 ug via INTRAVENOUS

## 2019-04-05 MED ORDER — ONDANSETRON HCL 4 MG/2ML IJ SOLN
INTRAMUSCULAR | Status: DC | PRN
  Administered 2019-04-05: 4 mg via INTRAVENOUS

## 2019-04-05 MED ORDER — MIDAZOLAM HCL 2 MG/2ML IJ SOLN
INTRAMUSCULAR | Status: AC
  Filled 2019-04-05: qty 2

## 2019-04-05 MED ORDER — CHLORHEXIDINE GLUCONATE 4 % EX LIQD
60.0000 mL | Freq: Once | CUTANEOUS | Status: AC
  Administered 2019-04-05: 4 via TOPICAL
  Filled 2019-04-05: qty 60

## 2019-04-05 MED ORDER — DEXAMETHASONE SODIUM PHOSPHATE 10 MG/ML IJ SOLN
INTRAMUSCULAR | Status: DC | PRN
  Administered 2019-04-05: 10 mg via INTRAVENOUS

## 2019-04-05 MED ORDER — FENTANYL CITRATE (PF) 250 MCG/5ML IJ SOLN
INTRAMUSCULAR | Status: AC
  Filled 2019-04-05: qty 5

## 2019-04-05 MED ORDER — MIDAZOLAM HCL 5 MG/5ML IJ SOLN
INTRAMUSCULAR | Status: DC | PRN
  Administered 2019-04-05: 2 mg via INTRAVENOUS

## 2019-04-05 MED ORDER — VANCOMYCIN HCL 1000 MG IV SOLR
INTRAVENOUS | Status: AC
  Filled 2019-04-05: qty 1000

## 2019-04-05 MED ORDER — VANCOMYCIN HCL 500 MG IV SOLR
INTRAVENOUS | Status: DC | PRN
  Administered 2019-04-05: 500 mg

## 2019-04-05 SURGICAL SUPPLY — 47 items
ADH SKN CLS LQ APL DERMABOND (GAUZE/BANDAGES/DRESSINGS) ×1
APL PRP STRL LF DISP 70% ISPRP (MISCELLANEOUS) ×2
BIT DRILL SHORT 4.2 (BIT) IMPLANT
BNDG COHESIVE 4X5 TAN STRL (GAUZE/BANDAGES/DRESSINGS) ×3 IMPLANT
BNDG GAUZE ELAST 4 BULKY (GAUZE/BANDAGES/DRESSINGS) ×3 IMPLANT
CHLORAPREP W/TINT 26 (MISCELLANEOUS) ×4 IMPLANT
CLOSURE STERI-STRIP 1/2X4 (GAUZE/BANDAGES/DRESSINGS)
CLSR STERI-STRIP ANTIMIC 1/2X4 (GAUZE/BANDAGES/DRESSINGS) ×1 IMPLANT
COVER PERINEAL POST (MISCELLANEOUS) ×1 IMPLANT
COVER SURGICAL LIGHT HANDLE (MISCELLANEOUS) ×3 IMPLANT
COVER WAND RF STERILE (DRAPES) ×3 IMPLANT
DERMABOND ADHESIVE PROPEN (GAUZE/BANDAGES/DRESSINGS) ×2
DERMABOND ADVANCED .7 DNX6 (GAUZE/BANDAGES/DRESSINGS) IMPLANT
DRAPE STERI IOBAN 125X83 (DRAPES) ×3 IMPLANT
DRILL BIT SHORT 4.2 (BIT) ×6
DRSG AQUACEL AG ADV 3.5X 6 (GAUZE/BANDAGES/DRESSINGS) ×3 IMPLANT
DRSG MEPILEX BORDER 4X4 (GAUZE/BANDAGES/DRESSINGS) ×6 IMPLANT
DURAPREP 26ML APPLICATOR (WOUND CARE) ×1 IMPLANT
ELECT REM PT RETURN 9FT ADLT (ELECTROSURGICAL) ×3
ELECTRODE REM PT RTRN 9FT ADLT (ELECTROSURGICAL) ×1 IMPLANT
GLOVE BIOGEL PI IND STRL 8 (GLOVE) ×1 IMPLANT
GLOVE BIOGEL PI INDICATOR 8 (GLOVE) ×2
GLOVE BIOGEL PI ORTHO PRO SZ8 (GLOVE)
GLOVE ECLIPSE 8.0 STRL XLNG CF (GLOVE) ×6 IMPLANT
GLOVE PI ORTHO PRO STRL SZ8 (GLOVE) IMPLANT
GLOVE SURG ORTHO 8.0 STRL STRW (GLOVE) IMPLANT
GOWN STRL REUS W/ TWL LRG LVL3 (GOWN DISPOSABLE) ×2 IMPLANT
GOWN STRL REUS W/TWL 2XL LVL3 (GOWN DISPOSABLE) IMPLANT
GOWN STRL REUS W/TWL LRG LVL3 (GOWN DISPOSABLE) ×6
GUIDEWIRE 3.2X400 (WIRE) ×6 IMPLANT
KIT TURNOVER KIT B (KITS) ×3 IMPLANT
MANIFOLD NEPTUNE II (INSTRUMENTS) ×3 IMPLANT
NAIL CANN FRN LFT 10X400 (Nail) ×2 IMPLANT
NS IRRIG 1000ML POUR BTL (IV SOLUTION) ×3 IMPLANT
PACK TOTAL JOINT (CUSTOM PROCEDURE TRAY) ×2 IMPLANT
PAD ARMBOARD 7.5X6 YLW CONV (MISCELLANEOUS) ×3 IMPLANT
REAMER ROD DEEP FLUTE 2.5X950 (INSTRUMENTS) ×2 IMPLANT
SCREW LOCK STAR 5X46 (Screw) ×4 IMPLANT
SCREW RECON 6.5X100MM (Screw) ×2 IMPLANT
SCREW RECON 6.5X105MM (Screw) ×2 IMPLANT
SUT MNCRL AB 4-0 PS2 18 (SUTURE) ×1 IMPLANT
SUT VIC AB 0 CT1 27 (SUTURE) ×3
SUT VIC AB 0 CT1 27XBRD ANBCTR (SUTURE) ×1 IMPLANT
SUT VIC AB 2-0 CT1 27 (SUTURE) ×3
SUT VIC AB 2-0 CT1 TAPERPNT 27 (SUTURE) ×1 IMPLANT
TOWEL OR 17X24 6PK STRL BLUE (TOWEL DISPOSABLE) ×3 IMPLANT
TOWEL OR 17X26 10 PK STRL BLUE (TOWEL DISPOSABLE) ×3 IMPLANT

## 2019-04-05 NOTE — Anesthesia Procedure Notes (Signed)
Performed by: Jed Limerick, CRNA Comments: Previously intubated from ICU.

## 2019-04-05 NOTE — Consult Note (Signed)
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 variant.  Keep immobilized in collar for now.  Once patient is stable enough to tolerate upright x-rays we will assess for gross instability.  No indication for operative intervention at this point.  Sherilyn Cooter A Roben Schliep 04/05/2019, 12:19 AM

## 2019-04-05 NOTE — Anesthesia Preprocedure Evaluation (Signed)
Anesthesia Evaluation  Patient identified by MRN, date of birth, ID band  Reviewed: Allergy & Precautions, Patient's Chart, lab work & pertinent test results, Unable to perform ROS - Chart review only  Airway Mallampati: Intubated       Dental no notable dental hx.    Pulmonary neg pulmonary ROS,    Pulmonary exam normal breath sounds clear to auscultation       Cardiovascular negative cardio ROS Normal cardiovascular exam Rhythm:Regular Rate:Normal     Neuro/Psych negative neurological ROS     GI/Hepatic   Endo/Other    Renal/GU      Musculoskeletal   Abdominal   Peds  Hematology   Anesthesia Other Findings   Reproductive/Obstetrics                             Anesthesia Physical Anesthesia Plan  ASA: II  Anesthesia Plan: General   Post-op Pain Management:    Induction: Inhalational  PONV Risk Score and Plan: Treatment may vary due to age or medical condition  Airway Management Planned:   Additional Equipment:   Intra-op Plan:   Post-operative Plan: Post-operative intubation/ventilation  Informed Consent: I have reviewed the patients History and Physical, chart, labs and discussed the procedure including the risks, benefits and alternatives for the proposed anesthesia with the patient or authorized representative who has indicated his/her understanding and acceptance.       Plan Discussed with: CRNA and Anesthesiologist  Anesthesia Plan Comments:         Anesthesia Quick Evaluation
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
## 2019-04-05 NOTE — Anesthesia Postprocedure Evaluation (Signed)
Anesthesia Post Note  Patient: Shea Stakes  Procedure(s) Performed: INTRAMEDULLARY (IM) NAIL FEMORAL (Left )     Patient location during evaluation: PACU Anesthesia Type: General Level of consciousness: awake and alert Pain management: pain level controlled Vital Signs Assessment: post-procedure vital signs reviewed and stable Respiratory status: spontaneous breathing, nonlabored ventilation, respiratory function stable and patient connected to nasal cannula oxygen Cardiovascular status: blood pressure returned to baseline and stable Postop Assessment: no apparent nausea or vomiting Anesthetic complications: no    Last Vitals:  Vitals:   04/05/19 1430 04/05/19 1507  BP:  (!) 148/100  Pulse: 94 92  Resp: 18 18  Temp:    SpO2: 100% 100%    Last Pain:  Vitals:   04/05/19 1158  TempSrc: Axillary                 Trevor Iha
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
## 2019-04-05 NOTE — Transfer of Care (Signed)
Immediate Anesthesia Transfer of Care Note  Patient: Shea Stakes  Procedure(s) Performed: INTRAMEDULLARY (IM) NAIL FEMORAL (Left )  Patient Location: 4N23  Anesthesia Type:General  Level of Consciousness: Patient remains intubated per anesthesia plan  Airway & Oxygen Therapy: Patient remains intubated per anesthesia plan and Patient placed on Ventilator (see vital sign flow sheet for setting)  Post-op Assessment: Report given to RN and Post -op Vital signs reviewed and stable  Post vital signs: Reviewed and stable  Last Vitals:  Vitals Value Taken Time  BP    Temp    Pulse    Resp    SpO2      Last Pain:  Vitals:   04/05/19 1158  TempSrc: Axillary         Complications: No apparent anesthesia complications

## 2019-04-05 NOTE — Progress Notes (Signed)
Pts cousin and granndmother updated on plan of care, that pt had returned from OR and was following commands and was comfortable.

## 2019-04-05 NOTE — Consult Note (Signed)
Patient consult not completed as handed off to Dr. Jena Gauss.  Note created in error.

## 2019-04-05 NOTE — Progress Notes (Signed)
Pt given pre op CHG and Hibiclens bath.

## 2019-04-05 NOTE — Progress Notes (Signed)
Received call from family member (cousin) Morene Rankins. He states that he and the grandmother Alfredo Martinez are the appropriate family contact persons. He states that the patient has a mother who "isn't in his life". Password obtained of Denyse Amass [id number]. Phone number for Mr Darrick Penna is [phone].

## 2019-04-05 NOTE — Progress Notes (Signed)
Report given to [facility] CRNA pre-OR

## 2019-04-05 NOTE — Progress Notes (Signed)
Pt back to floor post op VSS.

## 2019-04-05 NOTE — Progress Notes (Signed)
Social Work found number for pt's Grandmother Alfredo Martinez. I called her number at [phone]. No answer. Left message to call Consuella Lose at [facility] at [phone].
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
## 2019-04-06 LAB — BASIC METABOLIC PANEL
Anion gap: 9 (ref 5–15)
BUN: <5 mg/dL — ABNORMAL LOW (ref 6–20)
CO2: 21 mmol/L — ABNORMAL LOW (ref 22–32)
Calcium: 8.4 mg/dL — ABNORMAL LOW (ref 8.9–10.3)
Chloride: 105 mmol/L (ref 98–111)
Creatinine, Ser: 0.7 mg/dL (ref 0.61–1.24)
GFR calc Af Amer: >60 mL/min (ref >60)
GFR calc non Af Amer: >60 mL/min (ref >60)
Glucose, Bld: 126 mg/dL — ABNORMAL HIGH (ref 70–99)
Potassium: 4.4 mmol/L (ref 3.5–5.1)
Sodium: 135 mmol/L (ref 135–145)

## 2019-04-06 LAB — CBC
HCT: 33.9 % — ABNORMAL LOW (ref 39.0–52.0)
Hemoglobin: 10.9 g/dL — ABNORMAL LOW (ref 13.0–17.0)
MCH: 30.8 pg (ref 26.0–34.0)
MCHC: 32.2 g/dL (ref 30.0–36.0)
MCV: 95.8 fL (ref 80.0–100.0)
Platelets: 124 10*3/uL — ABNORMAL LOW (ref 150–400)
RBC: 3.54 MIL/uL — ABNORMAL LOW (ref 4.22–5.81)
RDW: 14.8 % (ref 11.5–15.5)
WBC: 7.2 10*3/uL (ref 4.0–10.5)
nRBC: 0 % (ref 0.0–0.2)

## 2019-04-06 LAB — MAGNESIUM: Magnesium: 1.8 mg/dL (ref 1.7–2.4)

## 2019-04-06 LAB — TRIGLYCERIDES: Triglycerides: 79 mg/dL (ref <150)

## 2019-04-06 MED ORDER — HYDROMORPHONE HCL 1 MG/ML IJ SOLN
1.0000 mg | INTRAMUSCULAR | Status: DC | PRN
  Administered 2019-04-06 – 2019-04-10 (×12): 1 mg via INTRAVENOUS
  Filled 2019-04-06 (×12): qty 1

## 2019-04-06 MED ORDER — LORAZEPAM 2 MG/ML IJ SOLN
2.0000 mg | INTRAMUSCULAR | Status: DC | PRN
  Administered 2019-04-06 – 2019-04-12 (×12): 2 mg via INTRAVENOUS
  Filled 2019-04-06 (×13): qty 1

## 2019-04-06 MED ORDER — LEVETIRACETAM 500 MG PO TABS
500.0000 mg | ORAL_TABLET | Freq: Two times a day (BID) | ORAL | Status: AC
  Administered 2019-04-06 – 2019-04-10 (×10): 500 mg via ORAL
  Filled 2019-04-06 (×10): qty 1

## 2019-04-06 NOTE — Procedures (Signed)
Extubation Procedure Note  Patient Details:   Name: Jeremy [name] DOB: 1996/01/26 MRN: [id number]   Airway Documentation:    Vent end date: 04/06/19 Vent end time: 0825   Evaluation  O2 sats: stable throughout Complications: No apparent complications Patient did tolerate procedure well. Bilateral Breath Sounds: Clear, Diminished   Yes   Positive cuff leak. Patient placed on 3L [place] with humidity. No stridor noted. Patient is able to talk.   Marlene Pfluger A Kaushal Vannice 04/06/2019, 8:33 AM

## 2019-04-06 NOTE — Progress Notes (Signed)
Assisted tele visit to patient with family member.  Ayriana Wix McEachran, RN  
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
## 2019-04-06 NOTE — Progress Notes (Signed)
Patient still intubated on ventilator.  Now awakens to voice.  Will follow some simple commands bilaterally.  Strength appears intact bilaterally.  Follow-up head CT scan yesterday without worsening of his small rim of subdural hematoma.  I think it is unlikely this subdural hematoma will progress over time.  He probably should have a follow-up head CT scan tomorrow morning but if this is stable no further scans will be necessary.  He has an atypical C2 fracture which may eventually require posterior fixation and fusion.  Continue collar for now.  Once patient is able to be out of bed I would like to get an upright cervical spine x-ray to evaluate for stability.

## 2019-04-06 NOTE — Progress Notes (Addendum)
SLP Cancellation Note  Patient Details Name: Jeremy [name] MRN: [id number] DOB: 29-Nov-1995   Cancelled treatment:       Reason Eval/Treat Not Completed: Fatigue/lethargy limiting ability to participate. Pt was approached for evaluation and was agitated and shouting regarding his pain upon the SLP's arrival. Pt was given dilaudid by RN for pain and then demonstrated difficulty maintaining adequate alertness to participate in the evaluation. SLP will follow up.  Albina Gosney I. Vear Clock, MS, CCC-SLP Acute Rehabilitation Services Office number [phone] Pager [phone]  Scheryl Marten 04/06/2019, 10:02 AM
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
## 2019-04-06 NOTE — Progress Notes (Signed)
Sister Coralee North updated over phone. She has been listed as a contact person. [phone].

## 2019-04-07 ENCOUNTER — Inpatient Hospital Stay (HOSPITAL_COMMUNITY): Payer: Medicaid Other

## 2019-04-07 ENCOUNTER — Encounter (HOSPITAL_COMMUNITY): Payer: Self-pay | Admitting: Student

## 2019-04-07 DIAGNOSIS — R739 Hyperglycemia, unspecified: Secondary | ICD-10-CM

## 2019-04-07 DIAGNOSIS — T1490XA Injury, unspecified, initial encounter: Secondary | ICD-10-CM

## 2019-04-07 DIAGNOSIS — F1010 Alcohol abuse, uncomplicated: Secondary | ICD-10-CM

## 2019-04-07 DIAGNOSIS — I10 Essential (primary) hypertension: Secondary | ICD-10-CM

## 2019-04-07 DIAGNOSIS — T380X5A Adverse effect of glucocorticoids and synthetic analogues, initial encounter: Secondary | ICD-10-CM

## 2019-04-07 DIAGNOSIS — S7292XA Unspecified fracture of left femur, initial encounter for closed fracture: Secondary | ICD-10-CM

## 2019-04-07 DIAGNOSIS — R0682 Tachypnea, not elsewhere classified: Secondary | ICD-10-CM

## 2019-04-07 DIAGNOSIS — S12100A Unspecified displaced fracture of second cervical vertebra, initial encounter for closed fracture: Secondary | ICD-10-CM

## 2019-04-07 DIAGNOSIS — S12101A Unspecified nondisplaced fracture of second cervical vertebra, initial encounter for closed fracture: Secondary | ICD-10-CM

## 2019-04-07 DIAGNOSIS — T148XXA Other injury of unspecified body region, initial encounter: Secondary | ICD-10-CM

## 2019-04-07 DIAGNOSIS — S069X9A Unspecified intracranial injury with loss of consciousness of unspecified duration, initial encounter: Secondary | ICD-10-CM

## 2019-04-07 DIAGNOSIS — D62 Acute posthemorrhagic anemia: Secondary | ICD-10-CM

## 2019-04-07 LAB — BASIC METABOLIC PANEL
Anion gap: 8 (ref 5–15)
BUN: <5 mg/dL — ABNORMAL LOW (ref 6–20)
CO2: 25 mmol/L (ref 22–32)
Calcium: 8.6 mg/dL — ABNORMAL LOW (ref 8.9–10.3)
Chloride: 106 mmol/L (ref 98–111)
Creatinine, Ser: 0.69 mg/dL (ref 0.61–1.24)
GFR calc Af Amer: >60 mL/min (ref >60)
GFR calc non Af Amer: >60 mL/min (ref >60)
Glucose, Bld: 107 mg/dL — ABNORMAL HIGH (ref 70–99)
Potassium: 3.5 mmol/L (ref 3.5–5.1)
Sodium: 139 mmol/L (ref 135–145)

## 2019-04-07 LAB — CBC
HCT: 31.2 % — ABNORMAL LOW (ref 39.0–52.0)
Hemoglobin: 10 g/dL — ABNORMAL LOW (ref 13.0–17.0)
MCH: 31.2 pg (ref 26.0–34.0)
MCHC: 32.1 g/dL (ref 30.0–36.0)
MCV: 97.2 fL (ref 80.0–100.0)
Platelets: 135 10*3/uL — ABNORMAL LOW (ref 150–400)
RBC: 3.21 MIL/uL — ABNORMAL LOW (ref 4.22–5.81)
RDW: 14.6 % (ref 11.5–15.5)
WBC: 7.8 10*3/uL (ref 4.0–10.5)
nRBC: 0 % (ref 0.0–0.2)

## 2019-04-07 LAB — TRIGLYCERIDES: Triglycerides: 95 mg/dL (ref <150)

## 2019-04-07 MED ORDER — CHLORHEXIDINE GLUCONATE CLOTH 2 % EX PADS
6.0000 | MEDICATED_PAD | Freq: Every day | CUTANEOUS | Status: DC
  Administered 2019-04-11: 6 via TOPICAL

## 2019-04-07 MED ORDER — OXYCODONE HCL 5 MG PO TABS
10.0000 mg | ORAL_TABLET | ORAL | Status: DC | PRN
  Administered 2019-04-07 – 2019-04-10 (×10): 10 mg via ORAL
  Filled 2019-04-07 (×10): qty 2

## 2019-04-07 MED ORDER — BOOST / RESOURCE BREEZE PO LIQD CUSTOM
1.0000 | Freq: Three times a day (TID) | ORAL | Status: DC
  Administered 2019-04-08 – 2019-04-12 (×10): 1 via ORAL

## 2019-04-07 MED ORDER — METHOCARBAMOL 750 MG PO TABS
750.0000 mg | ORAL_TABLET | Freq: Three times a day (TID) | ORAL | Status: DC
  Administered 2019-04-07 – 2019-04-12 (×15): 750 mg via ORAL
  Filled 2019-04-07 (×3): qty 1
  Filled 2019-04-07: qty 2
  Filled 2019-04-07: qty 1
  Filled 2019-04-07: qty 2
  Filled 2019-04-07 (×2): qty 1
  Filled 2019-04-07: qty 2
  Filled 2019-04-07 (×2): qty 1
  Filled 2019-04-07 (×2): qty 2
  Filled 2019-04-07 (×3): qty 1

## 2019-04-07 NOTE — Progress Notes (Signed)
Inpatient Rehabilitation-Admissions Coordinator   Met with pt at the bedside as follow up from PM&R consult. Pt able to engage in conversation with [facility], however continues to present with confusion and agitation that limited extent of information that could be conveyed regarding CIR program. Pt did give [facility] permission to call his mother and grandmother at this time. AC will follow up with pt Monday to further determine interest and candidacy.   Please call if questions.   Jhonnie Garner, OTR/L  Rehab Admissions Coordinator  [phone] 04/07/2019 4:17 PM
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
## 2019-04-08 ENCOUNTER — Other Ambulatory Visit: Payer: Self-pay

## 2019-04-08 LAB — BASIC METABOLIC PANEL
Anion gap: 8 (ref 5–15)
BUN: <5 mg/dL — ABNORMAL LOW (ref 6–20)
CO2: 25 mmol/L (ref 22–32)
Calcium: 8.6 mg/dL — ABNORMAL LOW (ref 8.9–10.3)
Chloride: 106 mmol/L (ref 98–111)
Creatinine, Ser: 0.64 mg/dL (ref 0.61–1.24)
GFR calc Af Amer: >60 mL/min (ref >60)
GFR calc non Af Amer: >60 mL/min (ref >60)
Glucose, Bld: 115 mg/dL — ABNORMAL HIGH (ref 70–99)
Potassium: 3.3 mmol/L — ABNORMAL LOW (ref 3.5–5.1)
Sodium: 139 mmol/L (ref 135–145)

## 2019-04-08 LAB — CBC
HCT: 29.5 % — ABNORMAL LOW (ref 39.0–52.0)
Hemoglobin: 9.7 g/dL — ABNORMAL LOW (ref 13.0–17.0)
MCH: 31.6 pg (ref 26.0–34.0)
MCHC: 32.9 g/dL (ref 30.0–36.0)
MCV: 96.1 fL (ref 80.0–100.0)
Platelets: 153 10*3/uL (ref 150–400)
RBC: 3.07 MIL/uL — ABNORMAL LOW (ref 4.22–5.81)
RDW: 14.3 % (ref 11.5–15.5)
WBC: 6.4 10*3/uL (ref 4.0–10.5)
nRBC: 0 % (ref 0.0–0.2)

## 2019-04-08 LAB — TRIGLYCERIDES: Triglycerides: 61 mg/dL (ref <150)

## 2019-04-08 MED ORDER — POTASSIUM CHLORIDE CRYS ER 20 MEQ PO TBCR
30.0000 meq | EXTENDED_RELEASE_TABLET | Freq: Once | ORAL | Status: AC
  Administered 2019-04-08: 30 meq via ORAL
  Filled 2019-04-08: qty 1

## 2019-04-08 NOTE — Progress Notes (Signed)
Patient ID: Jeremy [name], male   DOB: Aug 15, 1996, 23 y.o.   MRN: [id number] Denies headache.  States his neck feels pretty good.  He is in a cervical collar.  Denies arm pain or numbness tingling or weakness.  He is awake and alert and interactive.  He moves all extremities well.  No new recommendations.  Following.
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
## 2019-04-08 NOTE — Progress Notes (Signed)
CSW met with patient to assess for substance use concerns and provide SBIRT assessment. CSW noted patient scored a 12 on the SBIRT scale. CSW attempted to engage in a brief intervention and provide referral information for substance use and noted patient had declined.  Lamonte Richer, LCSW, [facility] [phone]

## 2019-04-09 LAB — TRIGLYCERIDES: Triglycerides: 70 mg/dL (ref <150)

## 2019-04-09 MED ORDER — DOCUSATE SODIUM 100 MG PO CAPS
100.0000 mg | ORAL_CAPSULE | Freq: Every day | ORAL | Status: DC
  Administered 2019-04-09 – 2019-04-11 (×3): 100 mg via ORAL
  Filled 2019-04-09 (×2): qty 1

## 2019-04-09 MED ORDER — ACETAMINOPHEN 325 MG PO TABS
650.0000 mg | ORAL_TABLET | Freq: Four times a day (QID) | ORAL | Status: DC
  Administered 2019-04-09 – 2019-04-12 (×12): 650 mg via ORAL
  Filled 2019-04-09 (×13): qty 2

## 2019-04-09 MED ORDER — POLYETHYLENE GLYCOL 3350 17 G PO PACK
17.0000 g | PACK | Freq: Every day | ORAL | Status: DC
  Administered 2019-04-09 – 2019-04-11 (×3): 17 g via ORAL
  Filled 2019-04-09 (×3): qty 1

## 2019-04-09 MED ORDER — POTASSIUM CHLORIDE CRYS ER 10 MEQ PO TBCR
10.0000 meq | EXTENDED_RELEASE_TABLET | Freq: Two times a day (BID) | ORAL | Status: DC
  Administered 2019-04-09 – 2019-04-12 (×7): 10 meq via ORAL
  Filled 2019-04-09 (×7): qty 1
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
## 2019-04-09 NOTE — Progress Notes (Signed)
Patient ID: GRAHM ETSITTY, male   DOB: Dec 05, 1995, 23 y.o.   MRN: [id number] Awake and alert.  Moving all extremities.  Complains of neck pain today.  Collar in place.  Denies numbness tingling or weakness.  Stable through the weekend.  Dr. Annette Stable to resume care tomorrow.
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
## 2019-04-10 LAB — CBC
HCT: 34.5 % — ABNORMAL LOW (ref 39.0–52.0)
Hemoglobin: 11.3 g/dL — ABNORMAL LOW (ref 13.0–17.0)
MCH: 31 pg (ref 26.0–34.0)
MCHC: 32.8 g/dL (ref 30.0–36.0)
MCV: 94.8 fL (ref 80.0–100.0)
Platelets: 257 10*3/uL (ref 150–400)
RBC: 3.64 MIL/uL — ABNORMAL LOW (ref 4.22–5.81)
RDW: 14 % (ref 11.5–15.5)
WBC: 6.2 10*3/uL (ref 4.0–10.5)
nRBC: 0 % (ref 0.0–0.2)

## 2019-04-10 LAB — BASIC METABOLIC PANEL
Anion gap: 9 (ref 5–15)
BUN: <5 mg/dL — ABNORMAL LOW (ref 6–20)
CO2: 23 mmol/L (ref 22–32)
Calcium: 9.1 mg/dL (ref 8.9–10.3)
Chloride: 106 mmol/L (ref 98–111)
Creatinine, Ser: 0.63 mg/dL (ref 0.61–1.24)
GFR calc Af Amer: >60 mL/min (ref >60)
GFR calc non Af Amer: >60 mL/min (ref >60)
Glucose, Bld: 121 mg/dL — ABNORMAL HIGH (ref 70–99)
Potassium: 3.8 mmol/L (ref 3.5–5.1)
Sodium: 138 mmol/L (ref 135–145)

## 2019-04-10 LAB — TRIGLYCERIDES: Triglycerides: 81 mg/dL (ref <150)

## 2019-04-10 MED ORDER — HYDROMORPHONE HCL 1 MG/ML IJ SOLN
0.5000 mg | INTRAMUSCULAR | Status: DC | PRN
  Administered 2019-04-11: 0.5 mg via INTRAVENOUS
  Filled 2019-04-10: qty 1

## 2019-04-10 MED ORDER — OXYCODONE HCL 5 MG PO TABS
10.0000 mg | ORAL_TABLET | ORAL | Status: DC | PRN
  Administered 2019-04-10 – 2019-04-11 (×6): 15 mg via ORAL
  Filled 2019-04-10 (×6): qty 3

## 2019-04-10 MED ORDER — NICOTINE 14 MG/24HR TD PT24
14.0000 mg | MEDICATED_PATCH | Freq: Every day | TRANSDERMAL | Status: DC
  Administered 2019-04-10 – 2019-04-12 (×3): 14 mg via TRANSDERMAL
  Filled 2019-04-10 (×3): qty 1
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
## 2019-04-10 NOTE — Progress Notes (Signed)
Inpatient Rehabilitation-Admissions Coordinator   Met with pt at the bedside. Pt slightly agitated during assessment and asking when he can go home. AC will continue to follow pt progress with therapies as PT unable to work with pt this AM due to sedation. Will need to see that pt is able to meaningfully participate in therapy sessions and show some indication that he is willing to remain in the hospital for treatment. Will follow up tomorrow.   Jhonnie Garner, OTR/L  Rehab Admissions Coordinator  [phone] 04/10/2019 1:03 PM
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
## 2019-04-10 NOTE — Progress Notes (Signed)
Patient still agitated and restless.  Requiring Haldol for sedation.  Cervical collar in place.  Neck pain stable.  Patient complains of thigh pain.  Motor and sensory function intact.  Follow-up C-spine x-ray demonstrates stable appearance of his C2 fracture.  I do not be there any good options for this patient with regard to his C2 fracture.  I think he is a poor candidate for halo treatment because of his mental status.  We will continue with Aspen collar for now as this appears to be adequate for stability and hopefully will allow for good healing.

## 2019-04-11 LAB — TRIGLYCERIDES: Triglycerides: 62 mg/dL (ref <150)

## 2019-04-11 MED ORDER — DIPHENHYDRAMINE HCL 50 MG/ML IJ SOLN
25.0000 mg | Freq: Four times a day (QID) | INTRAMUSCULAR | Status: DC | PRN
  Administered 2019-04-11 – 2019-04-12 (×2): 25 mg via INTRAVENOUS
  Filled 2019-04-11 (×3): qty 1
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
## 2019-04-11 NOTE — Progress Notes (Signed)
Overall stable.  Remains agitated but somewhat better today.  Cervical collar in place.  Continue supportive care.  Continue collar.  No new recommendations.
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
## 2019-04-12 ENCOUNTER — Inpatient Hospital Stay (HOSPITAL_COMMUNITY)
Admission: RE | Admit: 2019-04-12 | Discharge: 2019-04-25 | DRG: 945 | Disposition: A | Discharge status: Home Health | Payer: Medicaid Other | Source: Intra-hospital | Attending: Physical Medicine & Rehabilitation | Admitting: Physical Medicine & Rehabilitation

## 2019-04-12 ENCOUNTER — Other Ambulatory Visit: Payer: Self-pay

## 2019-04-12 ENCOUNTER — Encounter (HOSPITAL_COMMUNITY): Payer: Self-pay | Admitting: *Deleted

## 2019-04-12 DIAGNOSIS — S069X0S Unspecified intracranial injury without loss of consciousness, sequela: Secondary | ICD-10-CM | POA: Diagnosis not present

## 2019-04-12 DIAGNOSIS — S069X9A Unspecified intracranial injury with loss of consciousness of unspecified duration, initial encounter: Secondary | ICD-10-CM | POA: Diagnosis present

## 2019-04-12 DIAGNOSIS — S069X3S Unspecified intracranial injury with loss of consciousness of 1 hour to 5 hours 59 minutes, sequela: Secondary | ICD-10-CM

## 2019-04-12 DIAGNOSIS — S065X9D Traumatic subdural hemorrhage with loss of consciousness of unspecified duration, subsequent encounter: Secondary | ICD-10-CM | POA: Diagnosis present

## 2019-04-12 DIAGNOSIS — S12190D Other displaced fracture of second cervical vertebra, subsequent encounter for fracture with routine healing: Secondary | ICD-10-CM | POA: Diagnosis not present

## 2019-04-12 DIAGNOSIS — D62 Acute posthemorrhagic anemia: Secondary | ICD-10-CM | POA: Diagnosis not present

## 2019-04-12 DIAGNOSIS — M7989 Other specified soft tissue disorders: Secondary | ICD-10-CM | POA: Diagnosis not present

## 2019-04-12 DIAGNOSIS — E871 Hypo-osmolality and hyponatremia: Secondary | ICD-10-CM | POA: Diagnosis present

## 2019-04-12 DIAGNOSIS — I824Z1 Acute embolism and thrombosis of unspecified deep veins of right distal lower extremity: Secondary | ICD-10-CM | POA: Diagnosis not present

## 2019-04-12 DIAGNOSIS — R451 Restlessness and agitation: Secondary | ICD-10-CM | POA: Diagnosis present

## 2019-04-12 DIAGNOSIS — K5900 Constipation, unspecified: Secondary | ICD-10-CM | POA: Diagnosis present

## 2019-04-12 DIAGNOSIS — I82451 Acute embolism and thrombosis of right peroneal vein: Secondary | ICD-10-CM | POA: Diagnosis present

## 2019-04-12 DIAGNOSIS — F17210 Nicotine dependence, cigarettes, uncomplicated: Secondary | ICD-10-CM | POA: Diagnosis present

## 2019-04-12 DIAGNOSIS — S2242XD Multiple fractures of ribs, left side, subsequent encounter for fracture with routine healing: Secondary | ICD-10-CM | POA: Diagnosis not present

## 2019-04-12 DIAGNOSIS — Y92410 Unspecified street and highway as the place of occurrence of the external cause: Secondary | ICD-10-CM | POA: Diagnosis not present

## 2019-04-12 DIAGNOSIS — S72302D Unspecified fracture of shaft of left femur, subsequent encounter for closed fracture with routine healing: Secondary | ICD-10-CM | POA: Diagnosis not present

## 2019-04-12 DIAGNOSIS — S72352A Displaced comminuted fracture of shaft of left femur, initial encounter for closed fracture: Secondary | ICD-10-CM

## 2019-04-12 DIAGNOSIS — S069XAA Unspecified intracranial injury with loss of consciousness status unknown, initial encounter: Secondary | ICD-10-CM | POA: Diagnosis present

## 2019-04-12 DIAGNOSIS — Z720 Tobacco use: Secondary | ICD-10-CM | POA: Diagnosis not present

## 2019-04-12 DIAGNOSIS — R410 Disorientation, unspecified: Secondary | ICD-10-CM | POA: Diagnosis not present

## 2019-04-12 DIAGNOSIS — S069X1A Unspecified intracranial injury with loss of consciousness of 30 minutes or less, initial encounter: Secondary | ICD-10-CM

## 2019-04-12 DIAGNOSIS — R4689 Other symptoms and signs involving appearance and behavior: Secondary | ICD-10-CM | POA: Diagnosis not present

## 2019-04-12 LAB — TRIGLYCERIDES: Triglycerides: 81 mg/dL (ref <150)

## 2019-04-12 MED ORDER — ONDANSETRON HCL 4 MG/2ML IJ SOLN
4.0000 mg | Freq: Four times a day (QID) | INTRAMUSCULAR | Status: DC | PRN
  Filled 2019-04-12: qty 2

## 2019-04-12 MED ORDER — SORBITOL 70 % SOLN
30.0000 mL | Freq: Every day | Status: DC | PRN
  Filled 2019-04-12: qty 30

## 2019-04-12 MED ORDER — DIPHENHYDRAMINE HCL 25 MG PO CAPS: 25.0000 mg | ORAL_CAPSULE | Freq: Four times a day (QID) | ORAL | Status: DC | PRN

## 2019-04-12 MED ORDER — POLYETHYLENE GLYCOL 3350 17 G PO PACK
17.0000 g | PACK | Freq: Every day | ORAL | Status: DC
  Administered 2019-04-13 – 2019-04-24 (×11): 17 g via ORAL
  Filled 2019-04-12 (×13): qty 1

## 2019-04-12 MED ORDER — METHOCARBAMOL 750 MG PO TABS
750.0000 mg | ORAL_TABLET | Freq: Three times a day (TID) | ORAL | Status: DC
  Administered 2019-04-12 – 2019-04-25 (×39): 750 mg via ORAL
  Filled 2019-04-12 (×39): qty 1

## 2019-04-12 MED ORDER — ONDANSETRON 4 MG PO TBDP
4.0000 mg | ORAL_TABLET | Freq: Four times a day (QID) | ORAL | Status: DC | PRN
  Administered 2019-04-13: 01:00:00 4 mg via ORAL
  Filled 2019-04-12 (×3): qty 1

## 2019-04-12 MED ORDER — SODIUM CHLORIDE 0.9% FLUSH
10.0000 mL | Freq: Two times a day (BID) | INTRAVENOUS | Status: DC
  Administered 2019-04-12: 10 mL

## 2019-04-12 MED ORDER — NICOTINE 14 MG/24HR TD PT24
14.0000 mg | MEDICATED_PATCH | Freq: Every day | TRANSDERMAL | Status: DC
  Administered 2019-04-13 – 2019-04-18 (×3): 14 mg via TRANSDERMAL
  Filled 2019-04-12 (×11): qty 1

## 2019-04-12 MED ORDER — TRAZODONE HCL 50 MG PO TABS
50.0000 mg | ORAL_TABLET | Freq: Every day | ORAL | Status: DC
  Administered 2019-04-12 – 2019-04-24 (×13): 50 mg via ORAL
  Filled 2019-04-12 (×13): qty 1

## 2019-04-12 MED ORDER — LORAZEPAM 1 MG PO TABS
1.0000 mg | ORAL_TABLET | Freq: Four times a day (QID) | ORAL | Status: DC | PRN
  Administered 2019-04-12 – 2019-04-13 (×2): 1 mg via ORAL
  Filled 2019-04-12 (×3): qty 1

## 2019-04-12 MED ORDER — SENNOSIDES-DOCUSATE SODIUM 8.6-50 MG PO TABS
1.0000 | ORAL_TABLET | Freq: Every day | ORAL | Status: DC
  Administered 2019-04-12 – 2019-04-17 (×6): 1 via ORAL
  Filled 2019-04-12 (×6): qty 1

## 2019-04-12 MED ORDER — OXYCODONE HCL 5 MG PO TABS
10.0000 mg | ORAL_TABLET | ORAL | Status: DC | PRN
  Administered 2019-04-12 – 2019-04-24 (×34): 10 mg via ORAL
  Filled 2019-04-12 (×34): qty 2

## 2019-04-12 MED ORDER — LORAZEPAM 2 MG/ML IJ SOLN: 1.0000 mg | Freq: Four times a day (QID) | INTRAMUSCULAR | Status: DC | PRN

## 2019-04-12 MED ORDER — BOOST / RESOURCE BREEZE PO LIQD CUSTOM
1.0000 | Freq: Three times a day (TID) | ORAL | Status: DC
  Administered 2019-04-12 – 2019-04-23 (×14): 1 via ORAL

## 2019-04-12 MED ORDER — SODIUM CHLORIDE 0.9% FLUSH: 10.0000 mL | INTRAVENOUS | Status: DC | PRN
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
## 2019-04-12 NOTE — Plan of Care (Signed)
  Problem: Education: Goal: Knowledge of the prescribed therapeutic regimen Outcome: Progressing   Problem: Education: Goal: Knowledge of disease or condition will improve Outcome: Progressing   Problem: Clinical Measurements: Goal: Neurologic status will improve Outcome: Progressing   Problem: Tissue Perfusion: Goal: Ability to maintain intracranial pressure will improve Outcome: Progressing   Problem: Respiratory: Goal: Will regain and/or maintain adequate ventilation Outcome: Progressing   Problem: Skin Integrity: Goal: Risk for impaired skin integrity will decrease Outcome: Progressing   Problem: Psychosocial: Goal: Ability to verbalize positive feelings about self will improve Outcome: Progressing   Problem: Psychosocial: Goal: Ability to identify appropriate support needs will improve Outcome: Progressing   Problem: Health Behavior/Discharge Planning: Goal: Ability to manage health-related needs will improve Outcome: Progressing

## 2019-04-12 NOTE — Care Management (Signed)
Conversation with pt's aunt, Beryle Flock, regarding inpatient rehab stay and care needed afterwards.  Aunt confirms that she will be able to provide 24h care at discharge.  She would like to apply for CAPS assistance through pt's Medicaid.  Instructed her to follow up with pt's DSS Case Worker to apply for CAPS services.  She verbalizes understanding of this.    Reinaldo Raddle, RN, BSN  Trauma/Neuro ICU Case Manager [phone]

## 2019-04-12 NOTE — Discharge Instructions (Signed)
How to Use a Hard Cervical Collar A hard cervical collar limits the movement of the top part of your spine (cervical spine). The collar holds your head and neck in a straight position. A cervical collar may be used to treat:  A fracture in the neck.  Damage to the ligaments. Ligaments are tissues that connect bones.  Injury to the spinal cord. There are several types of hard cervical collars. Follow the manufacturer's instructions for use. These are general guidelines. What are the risks? Wearing a cervical collar is safe. However, problems may occur, including:  Skin breakdown.  Sores that form due to rubbing or pressure on the skin (pressure ulcers).  Pain.  Difficulty breathing.  Worsening of your condition if the collar is not placed correctly.  Increased risk of inhaling food or liquid into your lungs (aspiration). Supplies needed:  Extra cervical collar and replacement pads.  Ice.  Plastic bag.  Towel.  A watertight covering to put over the collar during bathing, if needed. How to use a cervical collar  Wear the cervical collar as told by your health care provider. Do not remove it unless told to do so by your health care provider.  You may be directed to remove it only when you check your skin and change the pads. While the collar is off: ? Ask another person to assist you if needed. ? Keep your head and neck straight.  Do not bend your neck or turn your head. ? Check your skin daily for red areas. Ask for help or use a mirror to check areas you cannot see. ? After checking your skin, wear the extra cervical collar while cleaning and changing the pads of the other collar.  Change the pads daily or more often if they become wet or dirty. Keep a clean set of replacement pads.  Do not let hard plastic edges touch your skin. Cover them with a soft pad.  If your cervical collar is not waterproof: ? Do not let it get wet. ? Cover it with a watertight covering when  you take a bath or a shower. Follow these instructions at home:   Put ice on the injured area to manage pain, stiffness, and swelling. ? Do not remove your cervical collar unless told to do so by your health care provider. ? Put ice in a plastic bag. ? Place a towel between your skin and the bag or between your cervical collar and the bag. ? Leave the ice on for 20 minutes, 2-3 times a day for the first 2 days after your injury.  Do not drive any vehicle until your health care provider approves.  Keep all follow-up visits as told by your health care provider. This is important. Any delay in getting the care that you need can prevent proper healing of your injury. Contact a health care provider if you have:  Red areas of skin under your cervical collar.  Pain that is not controlled with your medicines. Get help right away if you have:  Numbness or weakness in your arms or legs.  Difficulty breathing. These symptoms may represent a serious problem that is an emergency. Do not wait to see if the symptoms will go away. Get medical help right away. Call your local emergency services (911 in the [place] Do not drive yourself to the hospital. Summary  A cervical collar is a device that supports the chin and the back of the head. It restricts movement of the neck to  prevent more damage after a severe injury.  A cervical collar may be used to treat a fracture in the neck, damage to tissues that hold bones together (ligaments), or injury to the spinal cord.  Wear the cervical collar as told by your health care provider. Ask if you may remove the collar to shower, bathe, or eat, or to put ice on your neck. This information is not intended to replace advice given to you by your health care provider. Make sure you discuss any questions you have with your health care provider. Document Released: 07/11/2004 Document Revised: 04/26/2018 Document Reviewed: 09/10/2017 Elsevier Interactive Patient  Education  2019 [employer].
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
## 2019-04-12 NOTE — Progress Notes (Signed)
Patient ID: Jeremy [name], male   DOB: 05/03/1996, 23 y.o.   MRN: [id number] Admit to unit, oriented to rehab, plan of care, labs, medications and therapy schedule. States an understanding of information reviewed. Margarito Liner

## 2019-04-12 NOTE — Progress Notes (Signed)
Inpatient Rehabilitation-Admissions Coordinator   [facility] has confirmed DC plan with family after CIR. AC has received medical clearance for admit to CIR. Pt is willing to proceed with CIR and has been updated on expectations of a CIR stay. AC will notify RN, CM/SW.   Please call if questions.   Jhonnie Garner, OTR/L  Rehab Admissions Coordinator  [phone] 04/12/2019 12:07 PM
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
## 2019-04-12 NOTE — Progress Notes (Signed)
Pt being transferred to inpatient rehab. Report called. Pt's vitals stable. No concerns at this time.
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
## 2019-04-12 NOTE — Progress Notes (Signed)
Overall stable.  No new recommendations.  Continue cervical collar.
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
## 2019-04-13 ENCOUNTER — Inpatient Hospital Stay (HOSPITAL_COMMUNITY): Payer: Medicaid Other

## 2019-04-13 ENCOUNTER — Inpatient Hospital Stay (HOSPITAL_COMMUNITY): Payer: Medicaid Other | Admitting: Speech Pathology

## 2019-04-13 ENCOUNTER — Encounter (HOSPITAL_COMMUNITY): Payer: Self-pay

## 2019-04-13 DIAGNOSIS — M7989 Other specified soft tissue disorders: Secondary | ICD-10-CM

## 2019-04-13 DIAGNOSIS — S069X0S Unspecified intracranial injury without loss of consciousness, sequela: Secondary | ICD-10-CM

## 2019-04-13 DIAGNOSIS — S069X3S Unspecified intracranial injury with loss of consciousness of 1 hour to 5 hours 59 minutes, sequela: Secondary | ICD-10-CM

## 2019-04-13 DIAGNOSIS — D62 Acute posthemorrhagic anemia: Secondary | ICD-10-CM

## 2019-04-13 DIAGNOSIS — R4689 Other symptoms and signs involving appearance and behavior: Secondary | ICD-10-CM

## 2019-04-13 LAB — CBC WITH DIFFERENTIAL/PLATELET
Abs Immature Granulocytes: 0.04 10*3/uL (ref 0.00–0.07)
Basophils Absolute: 0 10*3/uL (ref 0.0–0.1)
Basophils Relative: 0 %
Eosinophils Absolute: 0 10*3/uL (ref 0.0–0.5)
Eosinophils Relative: 0 %
HCT: 35.8 % — ABNORMAL LOW (ref 39.0–52.0)
Hemoglobin: 12.4 g/dL — ABNORMAL LOW (ref 13.0–17.0)
Immature Granulocytes: 1 %
Lymphocytes Relative: 11 %
Lymphs Abs: 1 10*3/uL (ref 0.7–4.0)
MCH: 30.6 pg (ref 26.0–34.0)
MCHC: 34.6 g/dL (ref 30.0–36.0)
MCV: 88.4 fL (ref 80.0–100.0)
Monocytes Absolute: 0.9 10*3/uL (ref 0.1–1.0)
Monocytes Relative: 11 %
Neutro Abs: 6.7 10*3/uL (ref 1.7–7.7)
Neutrophils Relative %: 77 %
Platelets: 422 10*3/uL — ABNORMAL HIGH (ref 150–400)
RBC: 4.05 MIL/uL — ABNORMAL LOW (ref 4.22–5.81)
RDW: 13.3 % (ref 11.5–15.5)
WBC: 8.6 10*3/uL (ref 4.0–10.5)
nRBC: 0 % (ref 0.0–0.2)

## 2019-04-13 LAB — COMPREHENSIVE METABOLIC PANEL
ALT: 27 U/L (ref 0–44)
AST: 26 U/L (ref 15–41)
Albumin: 3.9 g/dL (ref 3.5–5.0)
Alkaline Phosphatase: 136 U/L — ABNORMAL HIGH (ref 38–126)
Anion gap: 11 (ref 5–15)
BUN: 11 mg/dL (ref 6–20)
CO2: 21 mmol/L — ABNORMAL LOW (ref 22–32)
Calcium: 9.5 mg/dL (ref 8.9–10.3)
Chloride: 93 mmol/L — ABNORMAL LOW (ref 98–111)
Creatinine, Ser: 0.61 mg/dL (ref 0.61–1.24)
GFR calc Af Amer: >60 mL/min (ref >60)
GFR calc non Af Amer: >60 mL/min (ref >60)
Glucose, Bld: 135 mg/dL — ABNORMAL HIGH (ref 70–99)
Potassium: 3.5 mmol/L (ref 3.5–5.1)
Sodium: 125 mmol/L — ABNORMAL LOW (ref 135–145)
Total Bilirubin: 1.3 mg/dL — ABNORMAL HIGH (ref 0.3–1.2)
Total Protein: 7 g/dL (ref 6.5–8.1)

## 2019-04-13 MED ORDER — QUETIAPINE FUMARATE 50 MG PO TABS
50.0000 mg | ORAL_TABLET | Freq: Once | ORAL | Status: AC
  Administered 2019-04-13: 14:00:00 50 mg via ORAL

## 2019-04-13 MED ORDER — QUETIAPINE FUMARATE 25 MG PO TABS
25.0000 mg | ORAL_TABLET | Freq: Two times a day (BID) | ORAL | Status: DC | PRN
  Administered 2019-04-14: 09:00:00 25 mg via ORAL
  Filled 2019-04-13: qty 1

## 2019-04-13 MED ORDER — LORAZEPAM 1 MG PO TABS
1.0000 mg | ORAL_TABLET | ORAL | Status: DC | PRN
  Administered 2019-04-13 – 2019-04-15 (×6): 1 mg via ORAL
  Filled 2019-04-13 (×6): qty 1

## 2019-04-13 MED ORDER — SODIUM CHLORIDE 0.9% FLUSH: 10.0000 mL | INTRAVENOUS | Status: DC | PRN

## 2019-04-13 MED ORDER — LORAZEPAM 2 MG/ML IJ SOLN: 1.0000 mg | Freq: Four times a day (QID) | INTRAMUSCULAR | Status: DC | PRN

## 2019-04-13 MED ORDER — QUETIAPINE FUMARATE 50 MG PO TABS
50.0000 mg | ORAL_TABLET | Freq: Every day | ORAL | Status: DC
  Administered 2019-04-13: 22:00:00 50 mg via ORAL
  Filled 2019-04-13 (×2): qty 1

## 2019-04-13 NOTE — Progress Notes (Addendum)
Pt impulsive, agitated and restless throughout shift. Pt slept 1.5-2hrs after PRN doses of ativan and Seroquel. Refused to eat breakfast and lunch. OT and nursing assisted pt to get in enclosure bed around 1630. Pt refused to get in enclosure bed at first. Attempted to leave unit, walked around in unit looking for exit to leave unit. Pt finally agreed to get in enclosure bed. At 1741, PRN pain med given for complaint of left leg pain. Pt then started screaming and asking to get out around 1800. Pt stood up while inside enclosure bed, with neck bent forward without C-Collar. Staff attempted to apply C-collar several times throughout shift, pt refused. Pt currently in enclosure bed with urinal, and call bell within reach. Continue plan of care.    Gerald Stabs, RN

## 2019-04-13 NOTE — Progress Notes (Signed)
Patient up to bathroom several times throughout the night. Sleeping for only brief periods. Impulsive, however accepting of direction of staff. Resistive to assessment, however allowed a limited assessment. Refused assessment of abdominal wound, refused dressing change. Given prn ativan for anxiety-brief effects noted. Oxycodone po x2 for neck pain-partial relief noted. Pt refusing to wear his cervical collar, attempts made several times throughout the night. Pt took collar out of this nurse's hand and placed in chair. Pt educated on risks associated with not complying with collar, pt stated "I'm not wearing it, that thing hurts my neck!" Zofran given x1 for c/o nausea-effective. Dan Angiulli-PA on unit, updated on patient's condition and noncompliance with collar.

## 2019-04-13 NOTE — Progress Notes (Signed)
04/13/19 0810  What Happened  Was fall witnessed? Yes  Who witnessed fall? Chonte Ricke, RN, Automotive engineerZach, NT  Patients activity before fall other (comment) (pt was in bed resting around 0800)  Point of contact hip/leg (pt's knees touched the floor)  Was patient injured? No  Follow Up  MD notified Jesusita Okaan, [place]  Time MD notified [phone]  Family notified No- patient refusal  Additional tests No  Simple treatment Other (comment) (non-needed)  Progress note created (see row info) Yes  Adult Fall Risk Assessment  Risk Factor Category (scoring not indicated) Fall has occurred during this admission (document High fall risk)  Age 23  Fall History: Fall within 6 months prior to admission 5  Elimination; Bowel and/or Urine Incontinence 0  Elimination; Bowel and/or Urine Urgency/Frequency 2  Medications: includes PCA/Opiates, Anti-convulsants, Anti-hypertensives, Diuretics, Hypnotics, Laxatives, Sedatives, and Psychotropics 5  Patient Care Equipment 1  Mobility-Assistance 2  Mobility-Gait 2  Mobility-Sensory Deficit 2  Altered awareness of immediate physical environment 1  Impulsiveness 2  Lack of understanding of one's physical/cognitive limitations 4  Total Score 26  Patient Fall Risk Level High fall risk  Adult Fall Risk Interventions  Required Bundle Interventions *See Row Information* High fall risk - low, moderate, and high requirements implemented  Additional Interventions Use of appropriate toileting equipment (bedpan, BSC, etc.) (unable to obtain telesitter. obtained order for enclosure bed)  Screening for Fall Injury Risk (To be completed on HIGH fall risk patients) - Assessing Need for Low Bed  Risk For Fall Injury- Low Bed Criteria Previous fall this admission  Will Implement Low Bed and Floor Mats Yes  Screening for Fall Injury Risk (To be completed on HIGH fall risk patients who do not meet crieteria for Low Bed) - Assessing Need for Floor Mats Only  Risk For Fall Injury- Criteria for Floor  Mats Confusion/dementia (+NuDESC, CIWA, TBI, etc.);Noncompliant with safety precautions  Will Implement Floor Mats Yes  Pain Assessment  Pain Scale Faces  Faces Pain Scale 4  Pain Type Acute pain  Pain Location Leg  Pain Orientation Left  Pain Onset On-going  Pain Intervention(s) Medication (See eMAR) (robaxin)  Neurological  Neuro (WDL) X  Level of Consciousness Alert  Orientation Level Oriented to person;Disoriented to time;Disoriented to place;Oriented to situation  Cognition Poor safety awareness;Poor judgement;Poor attention/concentration;Impulsive;Memory impairment;Unable to follow commands  Speech Clear  Motor Function/Sensation Assessment Grip  R Hand Grip Moderate  L Hand Grip Moderate   R Foot Dorsiflexion Moderate  L Foot Dorsiflexion Moderate  R Foot Plantar Flexion Moderate  L Foot Plantar Flexion Moderate  RUE Motor Response Purposeful movement  RUE Motor Strength 4  LUE Motor Response Purposeful movement  LUE Motor Strength 4  RLE Motor Response Purposeful movement  RLE Motor Strength 4  LLE Motor Response Purposeful movement  LLE Motor Strength 3  Neuro Symptoms Agitation;Combative;Anxiety;Forgetful  Neuro symptoms relieved by Anti-anxiety medication;Rest  Musculoskeletal  Musculoskeletal (WDL) X  Assistive Device None (pt refuses to use equipments )  Generalized Weakness Yes  Weight Bearing Restrictions Yes  LLE Weight Bearing WBAT  Musculoskeletal Details  LLE Weakness  Neck Injury/trauma  Neck Ortho/Supportive Device Collar (refused)  Collar Off;C-Collar (pt refused to apply)  Integumentary  Integumentary (WDL) X  Skin Color Appropriate for ethnicity  Skin Condition Dry  Skin Integrity Abrasion;Ecchymosis;Rash  Abrasion Location Arm;Shoulder  Abrasion Location Orientation Bilateral  Ecchymosis Location Foot;Leg  Ecchymosis Location Orientation Bilateral  Ecchymosis Intervention Other (Comment) (assessed, present on admission)  Rash  Location  Back  Rash Location Orientation Lower;Mid  Rash Intervention Other (Comment) (assessed, present on admission)   Assisted fall at 0810. Writer heard bed alarm going off from pt's room around 0810. Upon arrival, pt was sitting on chair near window leaning forward as though he lost balance while sitting. Writer held pt's upper body up to prevent hiting head against bed rail. In the process pt slid off of the chair and touched the floor with his knees. Pt stated that he was trying to get out of bed. Pt was last seen by writer around 0740, pt was in bed sleeping.  No injuries to the pt. Vital signs assessed and stable. [name], PA made aware. New order obtained.  Pt gets out of bed unassisted, unable to follow directions, forgetful and gets easily agitated. Will implement new orders. Continue plan of care.   Gerald Stabs, RN

## 2019-04-13 NOTE — Progress Notes (Signed)
Neurosurgery Dr. Annette Stable contacted in regards to patient refusing to wear his hard cervical collar for unstable C2 fracture.  He is currently in a enclosure bed for safety.  There is no plan for halo brace at this time.  Recommendations were made to tape the Velcro sides to prevent him from taking off his collar.
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
## 2019-04-13 NOTE — Progress Notes (Signed)
Patient getting out of bed constantly.  Has been perseverating on the need to urinate.  Has been to the restroom over 5 times in the last hour.  Bladder scanned, patient IS emptying as it is less than 50 ml residual.  Notified Marlowe Shores, PA of current status.  Increased frequency for ativan from every 6 hours to every 4 hours as needed.  Also recommended we give him a PRN dose of seroquel now due to agitation, restlessness, and noncompliance.  Dan spoke with Dr. Trenton Gammon about how the patient refuses to wear the hard c-collar for his unstable C2 fracture.  Dr. Trenton Gammon recommended we "tape the Velcro sides to prevent him from taking it off."  Patient is adamant about not wearing the collar.  Will attempt again once patient calms.  Will monitor.  Brita Romp, RN

## 2019-04-13 NOTE — Progress Notes (Signed)
[name], PA made aware of doppler exam results. No new orders at this time. Continue plan of care.   Jeremy [name] W Laney Louderback

## 2019-04-13 NOTE — Plan of Care (Signed)
  Problem: Consults Goal: RH GENERAL PATIENT EDUCATION Description: See Patient Education module for education specifics. Outcome: Progressing   Problem: RH BOWEL ELIMINATION Goal: RH STG MANAGE BOWEL WITH ASSISTANCE Description: STG Manage Bowel with mod I Assistance.  Outcome: Progressing   Problem: RH SKIN INTEGRITY Goal: RH STG SKIN FREE OF INFECTION/BREAKDOWN Description: Cues/reminders  Outcome: Progressing Goal: RH STG MAINTAIN SKIN INTEGRITY WITH ASSISTANCE Description: STG Maintain Skin Integrity With cues/reminders Assistance.  Outcome: Progressing Goal: RH STG ABLE TO PERFORM INCISION/WOUND CARE W/ASSISTANCE Description: STG Able To Perform Incision/Wound Care With min Assistance.  Outcome: Progressing   Problem: RH SAFETY Goal: RH STG ADHERE TO SAFETY PRECAUTIONS W/ASSISTANCE/DEVICE Description: STG Adhere to Safety Precautions With cues/reminders Assistance/Device.  Outcome: Progressing   Problem: RH PAIN MANAGEMENT Goal: RH STG PAIN MANAGED AT OR BELOW PT'S PAIN GOAL Description: At or below level 4  Outcome: Progressing   Problem: RH KNOWLEDGE DEFICIT GENERAL Goal: RH STG INCREASE KNOWLEDGE OF SELF CARE AFTER HOSPITALIZATION Description: Independently using handouts and educational materials  Outcome: Progressing   

## 2019-04-13 NOTE — Progress Notes (Addendum)
Physical Therapy Note  Patient Details  Name: Jeremy [name] MRN: [id number] Date of Birth: [birth year] Today's Date: 04/13/2019   Attempted PT eval this afternoon. Patient was asleep upon entry and was lethargic this afternoon. He would intermittently open eyes and nod his head to verbal stimulation, but did not verbalize any responses to PT's questions for orientation. PT attempted to don patient's C-collar and patient pushed the collar away. PT then asked if the patient would sit EOB and he nodded yes, but when PT attempted to assist the patient to sit he pushed PTs hands away and covered himself in the blanket and was no longer responsive to PT. PT will re-attempt evaluation as able when patient is less lethargic and agreeable to participate.    Jeremy [name] PT, DPT  04/13/2019, 3:22 PM

## 2019-04-13 NOTE — Plan of Care (Addendum)
Behavioral Plan Jeremy [name]  PLEASE BE AWARE PATIENT HAS NECK PRECAUTIONS AND SHOULD HAVE A C-COLLAR ON!  Elopement Risk as of 6/11 16:45  Updated: 6/12 8:45 am- Two staff members to go in and see the pt; no staff alone.   Updated Safety sitter present as of 6/12 pm   Behavior to decrease/ eliminate:  Patient has been on the floor on more than one occasion, has refused dressing changes for abdomen, refuses to wear c - collar, demonstrating a lot of restless behaviors.  Plan: MD/PA is aware of pt not wearing c -collar.  Pt's bed is being changed to enclosure bed to reduce chance of him getting up without assistance that may result in a fall.   Pt now with blood clot   Agitated Behavioral Scale (ABS) to be filled out with each therapy session and on every shift to track behaviors.   A sleep wake chart has been initiated - starting 04/13/19  RN to discuss with medical team about options of scheduled meds v prn for restlessness/agitation  Interventions:   - Pt is inconsistently oriented to place, time and situation- gently reorient patient with environmental cues -Pt to be offered toileting every 3 hrs. (MD made aware of urgency issues) -Pt does not respond well with authority.  Explain what you are trying to do and approach as a friend instead of an authority.  Explain the objective of therapy   Explain why the neck brace is important due to his fracture.  -Start slow.  -Work towards a set therapy schedule -Shut blinds (works better without so much light) -Don't offer tv - if it is off leave it off.

## 2019-04-13 NOTE — Progress Notes (Signed)
Bilateral lower extremity venous duplex completed. Refer to "CV Proc" under chart review to view preliminary results.  Critical results discussed with Mekides, RN, who will relay results to Irving Burton.  04/13/2019 10:13 AM Maudry Mayhew, MHA, RVT, RDCS, RDMS
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
## 2019-04-13 NOTE — Progress Notes (Signed)
Patient information reviewed and entered into [facility] System by Becky Oliviarose Punch, PPS coordinator. Information including medical coding, function ability, and quality indicators will be reviewed and updated through discharge.   
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
## 2019-04-14 ENCOUNTER — Inpatient Hospital Stay (HOSPITAL_COMMUNITY): Payer: Medicaid Other

## 2019-04-14 ENCOUNTER — Inpatient Hospital Stay (HOSPITAL_COMMUNITY): Payer: Medicaid Other | Admitting: Physical Therapy

## 2019-04-14 MED ORDER — QUETIAPINE FUMARATE 50 MG PO TABS
100.0000 mg | ORAL_TABLET | Freq: Every day | ORAL | Status: DC
  Administered 2019-04-14 – 2019-04-24 (×11): 100 mg via ORAL
  Filled 2019-04-14 (×8): qty 2
  Filled 2019-04-14: qty 4
  Filled 2019-04-14 (×4): qty 2

## 2019-04-14 MED ORDER — PROPRANOLOL HCL 20 MG PO TABS
20.0000 mg | ORAL_TABLET | Freq: Three times a day (TID) | ORAL | Status: DC
  Administered 2019-04-14 – 2019-04-25 (×32): 20 mg via ORAL
  Filled 2019-04-14 (×33): qty 1

## 2019-04-14 MED ORDER — QUETIAPINE FUMARATE 50 MG PO TABS
50.0000 mg | ORAL_TABLET | Freq: Two times a day (BID) | ORAL | Status: DC | PRN
  Administered 2019-04-14 – 2019-04-18 (×5): 50 mg via ORAL
  Filled 2019-04-14 (×5): qty 1

## 2019-04-14 MED ORDER — PROPRANOLOL HCL 20 MG PO TABS
20.0000 mg | ORAL_TABLET | Freq: Two times a day (BID) | ORAL | Status: DC
  Administered 2019-04-14: 20 mg via ORAL
  Filled 2019-04-14: qty 1
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
## 2019-04-14 NOTE — Progress Notes (Signed)
Occupational Therapy Session Note  Patient Details  Name: Jeremy [name] MRN: [id number] Date of Birth: [birth year]  Today's Date: 04/14/2019 OT Individual Time: 1300-1315 OT Individual Time Calculation (min): 15 min  and Today's Date: 04/14/2019 OT Missed Time: 8 Minutes Missed Time Reason: Other (comment)(agitation)   Short Term Goals: Week 1:  OT Short Term Goal 1 (Week 1): Pt will sequence bathing body parts with no more than min VC OT Short Term Goal 2 (Week 1): Pt will thread BLE into pants wiht supervision OT Short Term Goal 3 (Week 1): Pt will sustain attention to task for <5 min wiht min VC OT Short Term Goal 4 (Week 1): Pt will transfer to toilet with CGA at ambualtory level with LRAD  Skilled Therapeutic Interventions/Progress Updates:    Pt received in hallway propelling w/c with feet with nursing staff following. Pt yelling and agitated, disoriented. Pt encouraged to pull up to window in hopes of increasing orientation to place by looking out window. Pt used cell phone to call friend and became agitated with friend when she would not immediately pick him up. Attempted therapeutic use of self to redirect and calm pt but pt unable to self regulate. Pt yelling and demanding to be taken to his "front porch". Pt redirected via vc to propel w/c back to room. Another staff member intervened and was able to convince pt to get back to bed. Pt removed cervical collar despite cueing not to. Pt left supine with all 4 bed rails up, bed alarm set. Discussed behavior plan with clinical supervisor and nursing staff.   Therapy Documentation Precautions:  Precautions Precautions: Fall, Cervical Precaution Comments: No learned evidence of cervical precuations Required Braces or Orthoses: Cervical Brace Cervical Brace: Hard collar, At all times Restrictions Weight Bearing Restrictions: Yes LLE Weight Bearing: Weight bearing as tolerated   Therapy/Group: Individual Therapy  Curtis Sites 04/14/2019, 1:26 PM

## 2019-04-14 NOTE — IPOC Note (Signed)
Overall Plan of Care [facility](IPOC) Patient Details Name: Jeremy [name] MRN: [id number] DOB: October 08, 1996  Admitting Diagnosis: <principal problem not specified>  Hospital Problems: Active Problems:   TBI (traumatic brain injury) (HCC)     Functional Problem List: Nursing Pain, Endurance, Medication Management, Safety  PT Balance, Behavior, Safety, Edema, Sensory, Endurance, Skin Integrity, Motor, Pain  OT Balance, Behavior, Cognition, Endurance, Motor, Pain, Perception, Safety, Sensory  SLP Cognition, Behavior, Perception, Safety  TR         Basic ADL's: OT Grooming, Bathing, Dressing, Toileting     Advanced  ADL's: OT       Transfers: PT Bed Mobility, Bed to Chair, Car, Occupational psychologisturniture  OT Toilet, Research scientist (life sciences)Tub/Shower     Locomotion: PT Ambulation, Stairs     Additional Impairments: OT None  SLP Social Cognition   Social Interaction, Problem Solving, Memory, Attention, Awareness  TR      Anticipated Outcomes Item Anticipated Outcome  Self Feeding MOD I  Swallowing      Basic self-care  S  Toileting  S   Bathroom Transfers S  Bowel/Bladder  manage bowel with mod I assist  Transfers  supervision with LRAD  Locomotion  supervision with LRAD  Communication     Cognition  Mod A  Pain  pain at or below level 3  Safety/Judgment  maintain safety with cues/supervision   Therapy Plan: PT Intensity: Minimum of 1-2 x/day ,45 to 90 minutes PT Frequency: 5 out of 7 days PT Duration Estimated Length of Stay: 7-10 days OT Intensity: Minimum of 1-2 x/day, 45 to 90 minutes OT Frequency: 5 out of 7 days OT Duration/Estimated Length of Stay: 10-12 SLP Intensity: Minumum of 1-2 x/day, 30 to 90 minutes SLP Frequency: 3 to 5 out of 7 days SLP Duration/Estimated Length of Stay: 10 to 12 days   Due to the current state of emergency, patients may not be receiving their 3-hours of Medicare-mandated therapy.   Team Interventions: Nursing Interventions Patient/Family Education, Bowel  Management, Pain Management, Psychosocial Support, Medication Management, Discharge Planning  PT interventions Ambulation/gait training, Cognitive remediation/compensation, Discharge planning, DME/adaptive equipment instruction, Functional mobility training, Pain management, Psychosocial support, Therapeutic Activities, UE/LE Strength taining/ROM, UE/LE Coordination activities, Therapeutic Exercise, Stair training, Wheelchair propulsion/positioning, Skin care/wound management, Patient/family education, Neuromuscular re-education, Functional electrical stimulation, Disease management/prevention, FirefighterCommunity reintegration, Warden/rangerBalance/vestibular training  OT Interventions Warden/rangerBalance/vestibular training, Discharge planning, Pain management, Self Care/advanced ADL retraining, Therapeutic Activities, UE/LE Coordination activities, Disease mangement/prevention, Cognitive remediation/compensation, Functional mobility training, Patient/family education, Skin care/wound managment, Therapeutic Exercise, Visual/perceptual remediation/compensation, FirefighterCommunity reintegration, Fish farm managerDME/adaptive equipment instruction, Neuromuscular re-education, Psychosocial support, Splinting/orthotics, UE/LE Strength taining/ROM, Wheelchair propulsion/positioning  SLP Interventions Cognitive remediation/compensation, Therapeutic Activities, Patient/family education, Functional tasks  TR Interventions    SW/CM Interventions Discharge Planning, Psychosocial Support, Patient/Family Education   Barriers to Discharge MD  Behavior  Nursing      PT Inaccessible home environment, Decreased caregiver support, Medical stability, Home environment access/layout, Lack of/limited family support, Weight bearing restrictions, Behavior    OT Decreased caregiver support, Home environment access/layout, Lack of/limited family support, Behavior adherence to precautions  SLP      SW       Team Discharge Planning: Destination: PT-Home ,OT- Home ,  SLP-Home Projected Follow-up: PT-24 hour supervision/assistance, Home health PT, OT-  Home health OT, SLP-24 hour supervision/assistance, Home Health SLP, Outpatient SLP Projected Equipment Needs: PT-To be determined, OT- 3 in 1 bedside comode, Tub/shower bench, To be determined, SLP-None recommended by SLP Equipment Details: PT- , OT-  Patient/family  involved in discharge planning: PT- Patient unable/family or caregiver not available,  OT-Patient, SLP-Patient unable/family or caregive not available  MD ELOS: 10 days Medical Rehab Prognosis:  Excellent Assessment: The patient has been admitted for CIR therapies with the diagnosis of TBI with polytrauma. The team will be addressing functional mobility, strength, stamina, balance, safety, adaptive techniques and equipment, self-care, bowel and bladder mgt, patient and caregiver education, cognition, behavior, pain mgt, Goals are supervision with self-care and mobility and min to mod assist with cognition. .   Due to the current state of emergency, patients may not be receiving their 3 hours per day of Medicare-mandated therapy.    Meredith Staggers, MD, FAAPMR      See Team Conference Notes for weekly updates to the plan of care

## 2019-04-14 NOTE — Plan of Care (Signed)
  Problem: Consults Goal: RH GENERAL PATIENT EDUCATION Description: See Patient Education module for education specifics. Outcome: Progressing   Problem: RH BOWEL ELIMINATION Goal: RH STG MANAGE BOWEL WITH ASSISTANCE Description: STG Manage Bowel with mod I Assistance.  Outcome: Progressing   Problem: RH SKIN INTEGRITY Goal: RH STG SKIN FREE OF INFECTION/BREAKDOWN Description: Cues/reminders  Outcome: Progressing Goal: RH STG MAINTAIN SKIN INTEGRITY WITH ASSISTANCE Description: STG Maintain Skin Integrity With cues/reminders Assistance.  Outcome: Progressing Goal: RH STG ABLE TO PERFORM INCISION/WOUND CARE W/ASSISTANCE Description: STG Able To Perform Incision/Wound Care With min Assistance.  Outcome: Progressing   Problem: RH SAFETY Goal: RH STG ADHERE TO SAFETY PRECAUTIONS W/ASSISTANCE/DEVICE Description: STG Adhere to Safety Precautions With cues/reminders Assistance/Device.  Outcome: Progressing   Problem: RH PAIN MANAGEMENT Goal: RH STG PAIN MANAGED AT OR BELOW PT'S PAIN GOAL Description: At or below level 4  Outcome: Progressing   Problem: RH KNOWLEDGE DEFICIT GENERAL Goal: RH STG INCREASE KNOWLEDGE OF SELF CARE AFTER HOSPITALIZATION Description: Independently using handouts and educational materials  Outcome: Progressing   
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
## 2019-04-14 NOTE — Care Management (Signed)
Inpatient Rehabilitation Center Individual Statement of Services  Patient Name:  Jeremy [name]  Date:  04/14/2019  Welcome to the [facility].  Our goal is to provide you with an individualized program based on your diagnosis and situation, designed to meet your specific needs.  With this comprehensive rehabilitation program, you will be expected to participate in at least 3 hours of rehabilitation therapies Monday-Friday, with modified therapy programming on the weekends.  Your rehabilitation program will include the following services:  Physical Therapy (PT), Occupational Therapy (OT), Speech Therapy (ST), 24 hour per day rehabilitation nursing, Therapeutic Recreaction (TR), Neuropsychology, Case Management (Social Worker), Rehabilitation Medicine, Nutrition Services and Pharmacy Services  Weekly team conferences will be held on Tuesdays to discuss your progress.  Your Social Worker will talk with you frequently to get your input and to update you on team discussions.  Team conferences with you and your family in attendance may also be held.  Expected length of stay: 10-12 days   Overall anticipated outcome: supervision  Depending on your progress and recovery, your program may change. Your Social Worker will coordinate services and will keep you informed of any changes. Your Social Worker's name and contact numbers are listed  below.  The following services may also be recommended but are not provided by the [facility] will be made to provide these services after discharge if needed.  Arrangements include referral to agencies that provide these services.  Your insurance has been verified to be:  Medicaid Your primary doctor is:  None  Pertinent information will be shared with your doctor and your  insurance company.  Social Worker:  [place], [place] or ([phone]   Information discussed with and copy given to patient by: Lennart Pall, 04/14/2019, 5:21 PM

## 2019-04-14 NOTE — Progress Notes (Signed)
Pt is yelling and screaming that he wants to walk home or go to [place] was given about 20 minutes ago and has not kicked in. Provider on-call has been notified who recommended that patient should be kept safe in the enclosure bed as he is not stable and refusing C-Collar. Currently, pt is not in respiratory distress or discomfort. Will continue monitoring

## 2019-04-14 NOTE — Progress Notes (Signed)
Pt stayed in bed for the most part of the afternoon. Slept on and off for about 3 hours, from around 1600-1900. Got up twice to use bathroom, and returned to bed without any issues. Still refusing to wear C-collar. Pt did well with 1 on 1 sitter. Continue plan of care.   Gerald Stabs, RN

## 2019-04-14 NOTE — Progress Notes (Signed)
When pt was screaming and yelling earlier, the security staff was notified, pt already contracted for safety and in bed before the security staff members arrived. No intervention was needed from the security by the time they arrived to the unit.

## 2019-04-14 NOTE — Significant Event (Signed)
Patient continues to exhibit unsafe and aggressive behaviors.  Patient again raising voice and cussing at staff and attempting to leave the hospital.  Still perseverating on the death of his friend.  Patient is unable to be reoriented by either staff or friends (on telephone).  Patient restless, moving from bed to upright chair.   Dr. Naaman Plummer and Marlowe Sh[place], PA made aware of continued behavior.  Dr. Naaman Plummer recommended giving a 50 mg dose of seroquel now.  Patient walked out of room and therapy team attempted to redirect patient.  Patient was pushed in the wheelchair to the dayroom where the patient continued to escalate.  Spoke with team about implementing 1 on 1 sitter for patient and staff safety.  House supervisor notified, sent male sitter who seemed to develop a rapport with the patient and was able to get the patient back to his room and in bed to eat his lunch.  Will continue to monitor.  Brita Romp, RN
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
## 2019-04-15 ENCOUNTER — Inpatient Hospital Stay (HOSPITAL_COMMUNITY): Payer: Medicaid Other

## 2019-04-15 ENCOUNTER — Inpatient Hospital Stay (HOSPITAL_COMMUNITY): Payer: Medicaid Other | Admitting: Physical Therapy

## 2019-04-15 DIAGNOSIS — R410 Disorientation, unspecified: Secondary | ICD-10-CM

## 2019-04-15 DIAGNOSIS — R451 Restlessness and agitation: Secondary | ICD-10-CM

## 2019-04-15 MED ORDER — ACETAMINOPHEN 325 MG PO TABS
650.0000 mg | ORAL_TABLET | ORAL | Status: DC | PRN
  Administered 2019-04-15 – 2019-04-21 (×8): 650 mg via ORAL
  Filled 2019-04-15 (×8): qty 2
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
## 2019-04-15 NOTE — Plan of Care (Addendum)
  Problem: RH SAFETY Goal: RH STG ADHERE TO SAFETY PRECAUTIONS W/ASSISTANCE/DEVICE Description: STG Adhere to Safety Precautions With cues/reminders Assistance/Device.  Outcome: Not Progressing; sitter ; meds adjusted yesterday per report
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
## 2019-04-15 NOTE — Progress Notes (Signed)
Patient starting to get agitated wanting to go home. Wanting to see her aunt. Prn med given. RN and Camera operator talked with patient.Sitter in room.

## 2019-04-15 NOTE — Progress Notes (Signed)
Alert, awake, cooperative with treatment on this shift. Did not sleep well but at the same time he wasn't too agitated. He did not yell or scream at all, minimal anxiety. 1:1 sitter for safety. Will continue with the plan of care.

## 2019-04-15 NOTE — Progress Notes (Signed)
Approximately 1430 patient walked up to nurses station while on the phone with family. Patient insisting on going home or going outside to talk with Aunt. Patient mildly agitated, using curse words intermittently. Patient informed of visitor restrictions and also made aware that if family wanted to drop off food for him we would certainly pick it up for him. Patient reported ok and walked back to room with sitter following. Patient now resting in bed with sitter at bedside.

## 2019-04-15 NOTE — Progress Notes (Addendum)
Patient refused nicotine patch. Refused to wear collar.  Uncooperative.

## 2019-04-16 MED ORDER — IBUPROFEN 400 MG PO TABS
600.0000 mg | ORAL_TABLET | Freq: Two times a day (BID) | ORAL | Status: DC | PRN
  Administered 2019-04-16: 600 mg via ORAL
  Filled 2019-04-16: qty 2
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
## 2019-04-16 NOTE — Progress Notes (Signed)
Occupational Therapy Session Note  Patient Details  Name: Jeremy [name] MRN: [id number] Date of Birth: [birth year]  Today's Date: 04/16/2019 OT Individual Time: 1345-1400 OT Individual Time Calculation (min): 15 min    Short Term Goals: Week 1:  OT Short Term Goal 1 (Week 1): Pt will sequence bathing body parts with no more than min VC OT Short Term Goal 2 (Week 1): Pt will thread BLE into pants wiht supervision OT Short Term Goal 3 (Week 1): Pt will sustain attention to task for <5 min wiht min VC OT Short Term Goal 4 (Week 1): Pt will transfer to toilet with CGA at ambualtory level with LRAD  Skilled Therapeutic Interventions/Progress Updates:    Pt received supine with c/o pain in L leg and neck. Pt restless throughout session, moving often and talking rapidly. Pt completed bed mobility to EOB where he allowed c-collar to be donned. Pt distracted by cell phone and frequent notifications from messages. Pt returned to supine and left c-collar, as suggested. Encouraged pt to eat lunch, which he stated he would do after lunch. Pt politely asked therapist to leave as he "doesn't like people watching him eat"- big improvement in appropriate interaction with staff. Pt was given an ice pack for leg pain and medication from RN for neck pain. Pt left supine with all needs met, c-collar donned.   Therapy Documentation Precautions:  Precautions Precautions: Fall, Cervical Precaution Comments: No learned evidence of cervical precuations Required Braces or Orthoses: Cervical Brace Cervical Brace: Hard collar, At all times Restrictions Weight Bearing Restrictions: Yes LLE Weight Bearing: Weight bearing as tolerated   Therapy/Group: Individual Therapy  Curtis Sites 04/16/2019, 3:13 PM

## 2019-04-16 NOTE — Progress Notes (Addendum)
Patient slept for 6 hours, towards the later half of the shift. Pt cooperative with direction of staff, no aggressive behaviors, no attempts to leave unit. Continues to be easily agitated, however responds well to reassurance and emotional support from the staff. Medication compliant. Medicated x3 for c/o generalized pain-partially effective. Continues to be impulsive at times, unsteady gait, poor safety awareness, sitter at bedside. Pt allowed wound care to be performed to mid abdomen. Continues to refuse to wear collar, pt encouraged and educated on risks associated with noncompliance, pt continues to refuse. Placed collar on nedk briefly and then removed a few minutes later.

## 2019-04-16 NOTE — Progress Notes (Signed)
Patient refuses to wear his collar and refuses nicotine patch.

## 2019-04-16 NOTE — Plan of Care (Signed)
  Problem: RH BOWEL ELIMINATION Goal: RH STG MANAGE BOWEL WITH ASSISTANCE Description: STG Manage Bowel with mod I Assistance.  Outcome: Not Progressing;; unknown BM per report ; laxatives given    Problem: RH SKIN INTEGRITY Goal: RH STG SKIN FREE OF INFECTION/BREAKDOWN Description: Cues/reminders  Outcome: Progressing   Problem: RH SAFETY Goal: RH STG ADHERE TO SAFETY PRECAUTIONS W/ASSISTANCE/DEVICE Description: STG Adhere to Safety Precautions With cues/reminders Assistance/Device.  Outcome: Not Progressing; sitter 1:1   Problem: RH PAIN MANAGEMENT Goal: RH STG PAIN MANAGED AT OR BELOW PT'S PAIN GOAL Description: At or below level 4  Outcome: Not Progressing; c/o pain every 4 hrs   Problem: RH KNOWLEDGE DEFICIT GENERAL Goal: RH STG INCREASE KNOWLEDGE OF SELF CARE AFTER HOSPITALIZATION Description: Independently using handouts and educational materials  Outcome: Not Progressing

## 2019-04-16 NOTE — Progress Notes (Signed)
Patient getting agitated wanting to go home  to get some underwear after speaking with one of his family members on the phone. Wanting to speak with the doctor and charge nurse. RN talked to the patient. Prn med given.

## 2019-04-16 NOTE — Progress Notes (Addendum)
Patient c/o headache unrelieved oxycodone, tylenol given.RN offered ice therapy patient  claims it does not help.  MD notified new order noted.

## 2019-04-17 ENCOUNTER — Inpatient Hospital Stay (HOSPITAL_COMMUNITY): Payer: Medicaid Other | Admitting: Occupational Therapy

## 2019-04-17 ENCOUNTER — Inpatient Hospital Stay (HOSPITAL_COMMUNITY): Payer: Medicaid Other | Admitting: Speech Pathology

## 2019-04-17 ENCOUNTER — Inpatient Hospital Stay (HOSPITAL_COMMUNITY): Payer: Medicaid Other | Admitting: Physical Therapy

## 2019-04-17 LAB — BASIC METABOLIC PANEL
Anion gap: 12 (ref 5–15)
BUN: 9 mg/dL (ref 6–20)
CO2: 21 mmol/L — ABNORMAL LOW (ref 22–32)
Calcium: 9.2 mg/dL (ref 8.9–10.3)
Chloride: 103 mmol/L (ref 98–111)
Creatinine, Ser: 0.91 mg/dL (ref 0.61–1.24)
GFR calc Af Amer: >60 mL/min (ref >60)
GFR calc non Af Amer: >60 mL/min (ref >60)
Glucose, Bld: 121 mg/dL — ABNORMAL HIGH (ref 70–99)
Potassium: 3.7 mmol/L (ref 3.5–5.1)
Sodium: 136 mmol/L (ref 135–145)

## 2019-04-17 MED ORDER — TOPIRAMATE 25 MG PO TABS
25.0000 mg | ORAL_TABLET | Freq: Every day | ORAL | Status: DC
  Administered 2019-04-17 – 2019-04-24 (×8): 25 mg via ORAL
  Filled 2019-04-17 (×8): qty 1

## 2019-04-17 MED ORDER — IBUPROFEN 400 MG PO TABS: 600.0000 mg | ORAL_TABLET | Freq: Four times a day (QID) | ORAL | Status: DC | PRN

## 2019-04-17 NOTE — Progress Notes (Signed)
Speech Language Pathology Daily Session Note  Patient Details  Name: Jeremy [name] MRN: [id number] Date of Birth: 01-17-1996  Today's Date: 04/17/2019 SLP Individual Time: 1345-1445 SLP Individual Time Calculation (min): 60 min  Short Term Goals: Week 1: SLP Short Term Goal 1 (Week 1): Pt will follow basic directions related to ADLs in 5 out of 10 opportunities with Mod A cues. SLP Short Term Goal 2 (Week 1): Pt will utilize external memory aids to locate orientation information in 8 out of 10 opportunities with Max A cues. SLP Short Term Goal 3 (Week 1): Pt will perform basic problem solving tasks with Max A cues. SLP Short Term Goal 4 (Week 1): Pt will demonstrate sustained attention to task for 15 minutes with Max A cues. SLP Short Term Goal 5 (Week 1): Pt will initiate task in 9 out of 10 opportunities with Mod A cues.  Skilled Therapeutic Interventions:  Skilled treatment session focused on cognition goals. SLP facilitated session by providing supervision cues for selective attention and recall of card game known to pt/ Pt able to teach this Probation officer card game in sequencil concise manner. He was able to problem solve game as well as agreeable to wearing cervical collar. Pt much improvement over previous sessions. Pt left in bed with all needs within reach.      Pain Pain Assessment Pain Scale: 0-10 Pain Score: 6  Pain Type: Acute pain Pain Location: Head Pain Descriptors / Indicators: Aching Pain Frequency: Constant Pain Onset: On-going Patients Stated Pain Goal: 0 Pain Intervention(s): Medication (See eMAR);Repositioned;Emotional support Multiple Pain Sites: No  Therapy/Group: Individual Therapy  Shriya Aker 04/17/2019, 3:56 PM

## 2019-04-17 NOTE — Progress Notes (Signed)
Did not sleep well through the night and c/o headache this AM. Oxycodone and Tylenol given. No periods of agitation noted during shift. Patient was polite and appropriate throughout. Upon OOB to BR after having taken HS meds, patient c/o sudden dizziness and weakness. Charge nurse, Roselyn Reef, entered bathroom and found patient attempting to remain standing and assisted him back to bed. Patient also c/o external neck pain and "irritation." Will request Sarna cream from MD.
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
## 2019-04-17 NOTE — Progress Notes (Signed)
Physical Therapy Session Note  Patient Details  Name: Jeremy [name] MRN: [id number] Date of Birth: 1996/04/14  Today's Date: 04/17/2019 PT Individual Time: 4259-5638 PT Individual Time Calculation (min): 40 min   Short Term Goals: Week 1:  PT Short Term Goal 1 (Week 1): STG = LTG due to short ELOS.  Skilled Therapeutic Interventions/Progress Updates:  Pt received in bed. Pt transfers in/out of bed with mod I and ambulates in room/bathroom without AD & supervision to use bathroom mod I. With encouragement pt agreeable to ambulating in hallway. Pt elects to use RW to ambulate room<>dayroom with supervision with forward flexed posture and decreased weight shifting to L. Pt engaged in Wii [place] with supervision for standing balance and intermittent LUE support during task. Pt reports need to return to room to use bathroom then in room reports he no longer wanted to [place]. Phlebotomist present & pt willing to let her draw blood. Provided pt with sprite & pt able to open can and pour drink. Pt then declining further participation in therapy, reporting he wanted to lie down. Pt left in bed with call bell in reach & sitter present.  Pain: Pt reports pain in LLE & neck & RN In room & aware. Pt dons c-collar during session without cuing to do so.  Therapy Documentation Precautions:  Precautions Precautions: Fall, Cervical Precaution Comments: No learned evidence of cervical precuations Required Braces or Orthoses: Cervical Brace Cervical Brace: Hard collar, At all times Restrictions Weight Bearing Restrictions: Yes LLE Weight Bearing: Weight bearing as tolerated   General: PT Amount of Missed Time (min): 35 Minutes PT Missed Treatment Reason: Patient unwilling to participate    Therapy/Group: Individual Therapy  [name] 04/17/2019, 11:04 AM

## 2019-04-17 NOTE — Progress Notes (Signed)
Occupational Therapy Session Note  Patient Details  Name: Jeremy [name] MRN: [id number] Date of Birth: 1995-12-18  Today's Date: 04/17/2019 OT Individual Time: 0539-7673 OT Individual Time Calculation (min): 14 min  and Today's Date: 04/17/2019 OT Missed Time: 60 Minutes Missed Time Reason: Patient unwilling/refused to participate without medical reason;Pain   Short Term Goals: Week 1:  OT Short Term Goal 1 (Week 1): Pt will sequence bathing body parts with no more than min VC OT Short Term Goal 2 (Week 1): Pt will thread BLE into pants wiht supervision OT Short Term Goal 3 (Week 1): Pt will sustain attention to task for <5 min wiht min VC OT Short Term Goal 4 (Week 1): Pt will transfer to toilet with CGA at ambualtory level with LRAD  Skilled Therapeutic Interventions/Progress Updates:    Upon entering the room, pt supine in bed with eyes closed. Sitter present in room. OT sitting quietly next to pt and asking questions in non threatening manner. Pt keeping eyes closed and reporting headache and not sleeping well. Each question increasing frustration and and he rolls away from therapist and closing eyes to rest. Pt does not appear to be interested in participation and verbalized. " i'll do it later" when given several choices of OT intervention. Pt remained in bed with sitter present and all needs within reach.   Therapy Documentation Precautions:  Precautions Precautions: Fall, Cervical Precaution Comments: No learned evidence of cervical precuations Required Braces or Orthoses: Cervical Brace Cervical Brace: Hard collar, At all times Restrictions Weight Bearing Restrictions: Yes LLE Weight Bearing: Weight bearing as tolerated General: General OT Amount of Missed Time: 60 Minutes Vital Signs:  Pain: Pain Assessment Pain Scale: 0-10 Pain Score: 9  Pain Type: Acute pain Pain Location: Head Pain Orientation: Other (Comment)(generalized) Pain Descriptors / Indicators:  Aching;Throbbing Pain Frequency: Constant Patients Stated Pain Goal: 0 Pain Intervention(s): Medication (See eMAR);Emotional support;Relaxation;Rest ADL: ADL Upper Body Bathing: Moderate cueing, Minimal assistance Where Assessed-Upper Body Bathing: Edge of bed Upper Body Dressing: Supervision/safety Where Assessed-Upper Body Dressing: Edge of bed Lower Body Dressing: Moderate assistance Where Assessed-Lower Body Dressing: Edge of bed Toilet Transfer: Minimal assistance Toilet Transfer Method: Ambulating   Therapy/Group: Individual Therapy  Gypsy Decant 04/17/2019, 9:02 AM

## 2019-04-18 ENCOUNTER — Inpatient Hospital Stay (HOSPITAL_COMMUNITY): Payer: Medicaid Other | Admitting: Rehabilitation

## 2019-04-18 ENCOUNTER — Inpatient Hospital Stay (HOSPITAL_COMMUNITY): Payer: Medicaid Other | Admitting: Physical Therapy

## 2019-04-18 ENCOUNTER — Inpatient Hospital Stay (HOSPITAL_COMMUNITY): Payer: Medicaid Other

## 2019-04-18 MED ORDER — QUETIAPINE FUMARATE 50 MG PO TABS
50.0000 mg | ORAL_TABLET | Freq: Every day | ORAL | Status: DC
  Administered 2019-04-19 – 2019-04-25 (×7): 50 mg via ORAL
  Filled 2019-04-18 (×8): qty 1

## 2019-04-18 MED ORDER — SENNOSIDES-DOCUSATE SODIUM 8.6-50 MG PO TABS
2.0000 | ORAL_TABLET | Freq: Every day | ORAL | Status: DC
  Administered 2019-04-18 – 2019-04-24 (×7): 2 via ORAL
  Filled 2019-04-18 (×7): qty 2

## 2019-04-18 MED ORDER — MAGNESIUM CITRATE PO SOLN
1.0000 | Freq: Once | ORAL | Status: DC
  Filled 2019-04-18 (×3): qty 296

## 2019-04-18 NOTE — Progress Notes (Signed)
Physical Therapy Session Note  Patient Details  Name: Jeremy [name] MRN: [id number] Date of Birth: Jul 19, 1996  Today's Date: 04/18/2019 PT Individual Time: 7902-4097 PT Individual Time Calculation (min): 39 min   Short Term Goals: Week 1:  PT Short Term Goal 1 (Week 1): STG = LTG due to short ELOS.  Skilled Therapeutic Interventions/Progress Updates:  Pt received in bed & agreeable to tx. Pt transfers OOB with mod I & ambulates in room/bathroom without AD & mod I. Pt dons c-collar at beginning of session but keeps it loose, reporting he won't wear it tighter, and pt elects to doff it at end of session despite encouragement to wear it to alleviate neck pain. Pt performs toileting tasks with mod I.   Pt ambulates room<>gym with RW with forward lean on RW and LLE antalgic gait. Pt engages in basketball game of Horse with task focusing on sustained attention, standing balance & tolerance, and strengthening with pt requiring distant supervision for standing balance. Pt able to sustain attention to game in busy environment but unable to keep track of score. Pt then engaged in peg board task, correctly completing 2 most complex designs from choice of many with only cuing to initiate 2nd pattern. Pt unable to recall date but recognized ability to use phone to check today's date. Pt returned to bed in room & left in bed with sitter present & call bell in bed.  Pain: pt with c/o significant pain in posterior neck & LLE & rest breaks provided PRN & meds requested from RN.  Therapy Documentation Precautions:  Precautions Precautions: Fall, Cervical Precaution Comments: No learned evidence of cervical precuations Required Braces or Orthoses: Cervical Brace Cervical Brace: Hard collar, At all times Restrictions Weight Bearing Restrictions: Yes LLE Weight Bearing: Weight bearing as tolerated     Therapy/Group: Individual Therapy  [name] 04/18/2019, 3:47 PM

## 2019-04-18 NOTE — Plan of Care (Signed)
  Problem: RH Cognition - SLP Goal: RH LTG Patient will demonstrate orientation with cues Description:  LTG:  Patient will demonstrate orientation to person/place/time/situation with cues (SLP)   Flowsheets (Taken 04/18/2019 1903) LTG: Patient will demonstrate orientation using cueing (SLP): Supervision Note: Updated d/t progress   Problem: RH Problem Solving Goal: LTG Patient will demonstrate problem solving for (SLP) Description: LTG:  Patient will demonstrate problem solving for basic/complex daily situations with cues  (SLP) Flowsheets (Taken 04/18/2019 1903) LTG Patient will demonstrate problem solving for: Minimal Assistance - Patient > 75% Note: Updated d/t progress   Problem: RH Attention Goal: LTG Patient will demonstrate this level of attention during functional activites (SLP) Description: LTG:  Patient will will demonstrate this level of attention during functional activites (SLP) Flowsheets (Taken 04/18/2019 1903) LTG: Patient will demonstrate this level of attention during cognitive/linguistic activities with assistance of (SLP): Minimal Assistance - Patient > 75% Number of minutes patient will demonstrate attention during cognitive/linguistic activities: 30 Note: Updated d/t progress

## 2019-04-18 NOTE — Plan of Care (Signed)
  Problem: Consults Goal: RH GENERAL PATIENT EDUCATION Description: See Patient Education module for education specifics. Outcome: Progressing   Problem: RH BOWEL ELIMINATION Goal: RH STG MANAGE BOWEL WITH ASSISTANCE Description: STG Manage Bowel with mod I Assistance.  Outcome: Progressing   Problem: RH SKIN INTEGRITY Goal: RH STG SKIN FREE OF INFECTION/BREAKDOWN Description: Cues/reminders  Outcome: Progressing Goal: RH STG MAINTAIN SKIN INTEGRITY WITH ASSISTANCE Description: STG Maintain Skin Integrity With cues/reminders Assistance.  Outcome: Progressing Goal: RH STG ABLE TO PERFORM INCISION/WOUND CARE W/ASSISTANCE Description: STG Able To Perform Incision/Wound Care With min Assistance.  Outcome: Progressing   Problem: RH SAFETY Goal: RH STG ADHERE TO SAFETY PRECAUTIONS W/ASSISTANCE/DEVICE Description: STG Adhere to Safety Precautions With cues/reminders Assistance/Device.  Outcome: Progressing   Problem: RH PAIN MANAGEMENT Goal: RH STG PAIN MANAGED AT OR BELOW PT'S PAIN GOAL Description: At or below level 4  Outcome: Progressing   Problem: RH KNOWLEDGE DEFICIT GENERAL Goal: RH STG INCREASE KNOWLEDGE OF SELF CARE AFTER HOSPITALIZATION Description: Independently using handouts and educational materials  Outcome: Progressing
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
## 2019-04-18 NOTE — Progress Notes (Signed)
Occupational Therapy Session Note  Patient Details  Name: Jeremy [name] MRN: [id number] Date of Birth: [birth year]  Today's Date: 04/18/2019 OT Individual Time: 1100-1139 OT Individual Time Calculation (min): 39 min  36 min missed d/t pt declining further participation.    Short Term Goals: Week 1:  OT Short Term Goal 1 (Week 1): Pt will sequence bathing body parts with no more than min VC OT Short Term Goal 2 (Week 1): Pt will thread BLE into pants wiht supervision OT Short Term Goal 3 (Week 1): Pt will sustain attention to task for <5 min wiht min VC OT Short Term Goal 4 (Week 1): Pt will transfer to toilet with CGA at ambualtory level with LRAD  Skilled Therapeutic Interventions/Progress Updates:    Pt received supine with c/o soreness in neck and leg, no request for intervention. Pt agreeable to OT session. Cueing for initiation provided. C-collar donned EOB with max A.  Pt completed toileting with mod I, completing ambulatory transfer into bathroom and standing to urinate. Room was adjusted to minimize sensory input while pt was in bathroom, including opening blinds and turning down loud TV. Pt declined bathing/dressing stating he will "do that when he gets home". Pt used RW and completed 100 ft of functional mobility into dayroom. Pt transferred onto NuStep and completed 15 min of reciprocal stepping with BUE pushing/pulling as well on level 5 resistance. Pt was able to attend to task and self-regulate appropriately despite multiple other people being in room and volume being medium-loud, which is a good improvement. Pt was able to choose from 3 choices of activities to maximize participation. Pt completed 250 ft of functional mobility with RW. Pt interacted appropriately with staff throughout session and politely requested an ice cream at the end. Pt was left supine with sitter present and all needs met. C-collar still on.   Therapy Documentation Precautions:  Precautions Precautions:  Fall, Cervical Precaution Comments: No learned evidence of cervical precuations Required Braces or Orthoses: Cervical Brace Cervical Brace: Hard collar, At all times Restrictions Weight Bearing Restrictions: No LLE Weight Bearing: Weight bearing as tolerated   Therapy/Group: Individual Therapy  Curtis Sites 04/18/2019, 7:18 AM

## 2019-04-18 NOTE — Progress Notes (Signed)
Speech Language Pathology Daily Session Note  Patient Details  Name: Jeremy [name] MRN: [id number] Date of Birth: 04/19/1996  Today's Date: 04/18/2019 SLP Individual Time: 2248-2500 SLP Individual Time Calculation (min): 45 min  Short Term Goals: Week 1: SLP Short Term Goal 1 (Week 1): Pt will follow basic directions related to ADLs in 5 out of 10 opportunities with Mod A cues. SLP Short Term Goal 2 (Week 1): Pt will utilize external memory aids to locate orientation information in 8 out of 10 opportunities with Max A cues. SLP Short Term Goal 3 (Week 1): Pt will perform basic problem solving tasks with Max A cues. SLP Short Term Goal 4 (Week 1): Pt will demonstrate sustained attention to task for 15 minutes with Max A cues. SLP Short Term Goal 5 (Week 1): Pt will initiate task in 9 out of 10 opportunities with Mod A cues.  Skilled Therapeutic Interventions:   Skilled treatment session focused on cognition goals. SLP facilitated session by administering MOCA version 7.1. Pt obtained score of 24 out of 30 with n=> 26. Pt demonstrated increased error awareness when completing initial problem solving task but was not able to figure out how to fix. However given Min A cues, pt able to fix problem and complete line drawing (not counted as correct in scoring). Pt with improved sustained attention to task and improved task tolerance. Pt left upright in bed, bed alarm on and sitter present. Continue per current plan of care.   Pain Pain Assessment Pain Scale: 0-10 Pain Score: 7  Faces Pain Scale: Hurts a little bit Pain Type: Acute pain Pain Location: Leg Pain Orientation: Left Pain Descriptors / Indicators: Aching Pain Frequency: Constant Pain Onset: On-going Patients Stated Pain Goal: 2 Pain Intervention(s): Medication (See eMAR)  Therapy/Group: Individual Therapy  Jeremy [name] 04/18/2019, 7:06 PM
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
## 2019-04-18 NOTE — Progress Notes (Signed)
Pt bed alarm continued to go off and pt ambulated to the bathroom with out staff present 3x. Pt stated he knew he needed to wait but he could not hold it any longer. Other than ambulating with out staff present, pt was calm and cooperative through out the night.  The pts sleep chart was filled out,   pt did not sleep from 2000 to 0700. Pt is up sitting on the side of the bed eating breakfast at this time. No complaints of pain at this time. Will continue to monitor.

## 2019-04-18 NOTE — Progress Notes (Signed)
This nurse attempted to perform a head to toe assessment on the pt, pt refused. Pt appeared to be agitated and having difficulty sleeping. Pt was also being cooperative with staff when redirected to get back in the bed. PT was found standing up out of bed multiple times. Bed alarm on, floor mats in place, and non slip sock on the pt. Risk and benefits explained to the pt about not getting out of bed without a staff member in the room. Pt was giving several snacks through out the night. When ask about the LBM; pt stated "just know I have had one since the last time you think I have. Pt is resting in bed at this time, awake. No c/o of pain at this time. This nurse will continue to sit and monitor the patient for 1:1 care.

## 2019-04-19 ENCOUNTER — Inpatient Hospital Stay (HOSPITAL_COMMUNITY): Payer: Medicaid Other

## 2019-04-19 ENCOUNTER — Encounter (HOSPITAL_COMMUNITY): Payer: Self-pay | Admitting: *Deleted

## 2019-04-19 ENCOUNTER — Inpatient Hospital Stay (HOSPITAL_COMMUNITY): Payer: Medicaid Other | Admitting: Physical Therapy

## 2019-04-19 ENCOUNTER — Inpatient Hospital Stay (HOSPITAL_COMMUNITY): Payer: Medicaid Other | Admitting: Speech Pathology

## 2019-04-19 NOTE — Progress Notes (Signed)
Speech Language Pathology Daily Session Note  Patient Details  Name: Jeremy [name] MRN: [id number] Date of Birth: [birth year]  Today's Date: 04/19/2019 SLP Individual Time: 3235-5732 SLP Individual Time Calculation (min): 25 min  Short Term Goals: Week 1: SLP Short Term Goal 1 (Week 1): Pt will follow basic directions related to ADLs in 5 out of 10 opportunities with Mod A cues. SLP Short Term Goal 2 (Week 1): Pt will utilize external memory aids to locate orientation information in 8 out of 10 opportunities with Max A cues. SLP Short Term Goal 3 (Week 1): Pt will perform basic problem solving tasks with Max A cues. SLP Short Term Goal 4 (Week 1): Pt will demonstrate sustained attention to task for 15 minutes with Max A cues. SLP Short Term Goal 5 (Week 1): Pt will initiate task in 9 out of 10 opportunities with Mod A cues.  Skilled Therapeutic Interventions: Skilled treatment session focused on cognitive goals. SLP facilitated session by providing extra time and overall Mod I for functional problem solving with a basic money management task. Patient independently recalled the date and generated a calendar to hang in his room with supervision verbal cues. Patient also verbalized problem solving in regards to a mildly complex medication management task with supervision verbal cues. Patient left supine in bed with alarm on and all needs within reach. Continue with current plan of care.      Pain No/Denies Pain   Therapy/Group: Individual Therapy  [name] 04/19/2019, 2:03 PM

## 2019-04-19 NOTE — Progress Notes (Signed)
Pt refused to let this nurse perform shift assessment. Pt stated " I am fine and they checked me out earlier today, and my dressing does not need to be changed." will continue to monitor.

## 2019-04-19 NOTE — Progress Notes (Signed)
Occupational Therapy Session Note  Patient Details  Name: Jeremy [name] MRN: [id number] Date of Birth: [birth year]  Today's Date: 04/19/2019  Session 1: OT Individual Time: 1660-6301 OT Individual Time Calculation (min): 30 min   Session 2: OT Individual Time: 1400-1430 OT Individual Time Calculation (min): 30 min   60 min missed total in OT d/t refusal   Short Term Goals: Week 1:  OT Short Term Goal 1 (Week 1): Pt will sequence bathing body parts with no more than min VC OT Short Term Goal 2 (Week 1): Pt will thread BLE into pants wiht supervision OT Short Term Goal 3 (Week 1): Pt will sustain attention to task for <5 min wiht min VC OT Short Term Goal 4 (Week 1): Pt will transfer to toilet with CGA at ambualtory level with LRAD  Skilled Therapeutic Interventions/Progress Updates:    Session focused on ADLs at shower level. Pt completed ambulatory transfer into bathroom with (S). Pt completed toileting in standing with (S). Pt becoming frustrated with supervision and closing door to maintain privacy. Provided BSC in shower and reviewed safety and then pt was able to complete bathing sit <> stand and dressing with mod I. Per discussion with nurse, abdominal bandage applied. Pt returned to supine and declined any further participation. Bed alarm set.  Session 2: Pt received supine in bed with c/o neck hurting. Pt completed bed mobility to EOB and allowed C-collar to be donned. He completed toileting tasks and transfer with mod I in room. Pt returned to EOB and became increasingly frustrated with encouragement to leave room for therapy stating "I've done too much today already". Offered to bring activity into room and pt agreeable to this. Pt completed 3x pipe tree puzzle EOB to address problem solving from a near model. Pt able to attend to task for 5 minutes before stating he no longer wanted to complete this. Pt took off c-collar despite edu. Pt was given cues that his voice volume was  raising and he was becoming frustrated for no reason and pt was able to self regulate his anger through this conversation- which is a big improvement. Pt left supine with all needs met. 30 min missed from each session for a total of 60 min missed.   Therapy Documentation Precautions:  Precautions Precautions: Fall, Cervical Precaution Comments: No learned evidence of cervical precuations Required Braces or Orthoses: Cervical Brace Cervical Brace: Hard collar, At all times Restrictions Weight Bearing Restrictions: Yes LLE Weight Bearing: Weight bearing as tolerated   Therapy/Group: Individual Therapy  Curtis Sites 04/19/2019, 7:11 AM
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
## 2019-04-19 NOTE — Progress Notes (Signed)
Physical Therapy Session Note  Patient Details  Name: Jeremy [name] MRN: [id number] Date of Birth: [birth year]  Today's Date: 04/19/2019 PT Individual Time: 0930-1015 PT Individual Time Calculation (min): 45 min   Short Term Goals: Week 1:  PT Short Term Goal 1 (Week 1): STG = LTG due to short ELOS.  Skilled Therapeutic Interventions/Progress Updates:  Pt received in bed & agreeable to tx. Pt completes bed mobility independently but with decreased awareness as he leaves bed rails up. Pt ambulates around room without AD & mod I and completes toileting tasks with mod I (pt reports he has a BM). Pt ambulates room>gym>ortho gym>room with RW & supervision with min cuing to find room on return trip. Pt with less antalgic gait on this date but still decreased gait speed.  Pt negotiates 4 steps with B rails & CGA, unwilling to negotiate stairs without rails to simulate aunt's home despite encouragement. Pt completes car transfer at sedan simulated height with supervision & RW. Pt requests to return to room & does so. Pt willing to engage in 2 games of Spot It & sustains attention to task ~15 minutes, making correct matches. At end of session pt left in bed with sitter present.  Pt with c/o neck & LLE pain with rest breaks provided & pt encouraged to wear cervical collar.   Therapy Documentation Precautions:  Precautions Precautions: Fall, Cervical Precaution Comments: No learned evidence of cervical precuations Required Braces or Orthoses: Cervical Brace Cervical Brace: Hard collar, At all times Restrictions Weight Bearing Restrictions: Yes LLE Weight Bearing: Weight bearing as tolerated    Therapy/Group: Individual Therapy  [name] 04/19/2019, 10:35 AM

## 2019-04-20 ENCOUNTER — Inpatient Hospital Stay (HOSPITAL_COMMUNITY): Payer: Medicaid Other

## 2019-04-20 ENCOUNTER — Inpatient Hospital Stay (HOSPITAL_COMMUNITY): Payer: Medicaid Other | Admitting: Occupational Therapy

## 2019-04-20 ENCOUNTER — Inpatient Hospital Stay (HOSPITAL_COMMUNITY): Payer: Medicaid Other | Admitting: Speech Pathology

## 2019-04-20 NOTE — Progress Notes (Signed)
Social Work Patient ID: Jeremy [name], male   DOB: Sep 06, 1996, 23 y.o.   MRN: [id number]   Have reviewed team conference with pt and his aunt who are aware and agreeable with targeted d/c date of 6/23 and supervision goals.  Pt very calm.  Asks if his family knows and if aunt is willing to pick him up.  Aunt very agreeable.  Will discuss with team about plans for any family ed needs.  Jeremy Corella, LCSW

## 2019-04-20 NOTE — Progress Notes (Signed)
This nurse offered to assess and change the dressing on the abd. Pt refused stating he is fine, and doesn't need to be changed.

## 2019-04-20 NOTE — Progress Notes (Signed)
Speech Language Pathology Weekly Progress and Session Note  Patient Details  Name: Jeremy [name] MRN: [id number] Date of Birth: 1996/02/10  Beginning of progress report period: April 13, 2019 End of progress report period: April 20, 2019  Today's Date: 04/20/2019 SLP Individual Time: 1430-1455 SLP Individual Time Calculation (min): 25 min  Short Term Goals: Week 1: SLP Short Term Goal 1 (Week 1): Pt will follow basic directions related to ADLs in 5 out of 10 opportunities with Mod A cues. SLP Short Term Goal 1 - Progress (Week 1): Met SLP Short Term Goal 2 (Week 1): Pt will utilize external memory aids to locate orientation information in 8 out of 10 opportunities with Max A cues. SLP Short Term Goal 2 - Progress (Week 1): Met SLP Short Term Goal 3 (Week 1): Pt will perform basic problem solving tasks with Max A cues. SLP Short Term Goal 3 - Progress (Week 1): Met SLP Short Term Goal 4 (Week 1): Pt will demonstrate sustained attention to task for 15 minutes with Max A cues. SLP Short Term Goal 4 - Progress (Week 1): Met SLP Short Term Goal 5 (Week 1): Pt will initiate task in 9 out of 10 opportunities with Mod A cues. SLP Short Term Goal 5 - Progress (Week 1): Met    New Short Term Goals: Week 2: SLP Short Term Goal 1 (Week 2): STGs=LTGs  Weekly Progress Updates: Patient has made excellent gains and has met 5 of 5 STGs this reporting period. Currently, patient demonstrates behaviors consistent with a Rancho Level VII and requires overall Min A verbal cues to complete functional and familiar tasks safely in regards to problem solving, recall with use strategies, awareness and attention. Patient demonstrates improved participation in therapies with minimal episodes of agitation. Patient and family education ongoing. Patient would benefit from continued skilled SLP intervention to maximize his cognitive functioning and overall safety prior to discharge.      Intensity: Minumum of 1-2  x/day, 30 to 90 minutes Frequency: 3 to 5 out of 7 days Duration/Length of Stay: 04/25/19 Treatment/Interventions: Cognitive remediation/compensation;Therapeutic Activities;Patient/family education;Functional tasks;Internal/external aids;Environmental controls;Cueing hierarchy   Daily Session  Skilled Therapeutic Interventions: Skilled treatment session focused on cognitive goals. SLP facilitated session by providing supervision level verbal cues for complex problem solving/organization during a novel card task. Patient sustained attention to task for ~15 minutes with Mod I. Patient independently recalled the date and required Mod verbal cues to recall events from previous therapy sessions. Patient reporting pain in his neck and requested to lay supine in bed. RN made aware. Patient left supine in bed with alarm on and all needs within reach. Continue with current plan of care.     Pain Pain in neck, RN made aware and patient repositioned   Therapy/Group: Individual Therapy  [name] 04/20/2019, 6:51 AM

## 2019-04-20 NOTE — Progress Notes (Signed)
Occupational Therapy Weekly Progress Note  Patient Details  Name: Jeremy [name] MRN: [id number] Date of Birth: 28-May-1996  Beginning of progress report period: April 13, 2019 End of progress report period: April 20, 2019  Today's Date: 04/20/2019 OT Individual Time: 1530-1555 OT Individual Time Calculation (min): 25 min    Patient has met 4 of 4 short term goals.  Pt has made good progress this reporting period in BADLs and behavior management. Pt is completing BADLs at Supervision level with VC for donning C collar prior to mobility and continued education on neck fx being unstable requiring hard collar at all time. Pt continues to take collar off despite education. Pt is able to be redirected and self regulate outbursts and sustain attention to tasks ~15 min in moderately noisy environment.   Patient continues to demonstrate the following deficits: muscle weakness, decreased cardiorespiratoy endurance, decreased attention, decreased awareness, decreased problem solving, decreased safety awareness and decreased memory and decreased standing balance, decreased postural control, decreased balance strategies and difficulty maintaining precautions and therefore will continue to benefit from skilled OT intervention to enhance overall performance with BADL and iADL.  Patient progressing toward long term goals..  Continue plan of care.  OT Short Term Goals Week 1:  OT Short Term Goal 1 (Week 1): Pt will sequence bathing body parts with no more than min VC OT Short Term Goal 1 - Progress (Week 1): Met OT Short Term Goal 2 (Week 1): Pt will thread BLE into pants wiht supervision OT Short Term Goal 2 - Progress (Week 1): Met OT Short Term Goal 3 (Week 1): Pt will sustain attention to task for <5 min wiht min VC OT Short Term Goal 3 - Progress (Week 1): Met OT Short Term Goal 4 (Week 1): Pt will transfer to toilet with CGA at ambualtory level with LRAD OT Short Term Goal 4 - Progress (Week 1):  Met  Skilled Therapeutic Interventions/Progress Updates:    1;1. Pt received in bed, initially declining intervention, however willing to participate in room activities per pt suggestion. Pt cued to don collar after sitting up and pt completes mod I. Pt able to maintain attention to familiar card task ~10 min at MOD I level and learned novel card task requiring MOD VC for following rules ~10 min. Exited session with pt supine in bed, no collar despite cues to keep it on and call light in reach  Therapy Documentation Precautions:  Precautions Precautions: Fall, Cervical Precaution Comments: No learned evidence of cervical precuations Required Braces or Orthoses: Cervical Brace Cervical Brace: Hard collar, At all times Restrictions Weight Bearing Restrictions: Yes LLE Weight Bearing: Weight bearing as tolerated General:   Vital Signs: Therapy Vitals Temp: 98.1 F (36.7 C) Pulse Rate: 60 Resp: 16 BP: 132/78 Patient Position (if appropriate): Lying Oxygen Therapy SpO2: 100 % O2 Device: Room Air Pain: Pain Assessment Pain Scale: 0-10 Pain Score: 7  Pain Type: Acute pain Pain Location: Head Pain Orientation: Right;Left Pain Descriptors / Indicators: Aching Pain Frequency: Intermittent Pain Onset: On-going Patients Stated Pain Goal: 1 Pain Intervention(s): Medication (See eMAR) Multiple Pain Sites: No ADL: ADL Upper Body Bathing: Moderate cueing, Minimal assistance Where Assessed-Upper Body Bathing: Edge of bed Upper Body Dressing: Supervision/safety Where Assessed-Upper Body Dressing: Edge of bed Lower Body Dressing: Moderate assistance Where Assessed-Lower Body Dressing: Edge of bed Toilet Transfer: Minimal assistance Toilet Transfer Method: Ambulating Vision   Perception    Praxis   Exercises:   Other Treatments:  Therapy/Group: Individual Therapy  Tonny Branch 04/20/2019, 4:19 PM
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
## 2019-04-21 ENCOUNTER — Inpatient Hospital Stay (HOSPITAL_COMMUNITY): Payer: Medicaid Other

## 2019-04-21 ENCOUNTER — Inpatient Hospital Stay (HOSPITAL_COMMUNITY): Payer: Medicaid Other | Admitting: Physical Therapy

## 2019-04-21 ENCOUNTER — Inpatient Hospital Stay (HOSPITAL_COMMUNITY): Payer: Medicaid Other | Admitting: Speech Pathology
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
## 2019-04-21 NOTE — Progress Notes (Signed)
Physical Therapy Weekly Progress Note  Patient Details  Name: Jeremy [name] MRN: [id number] Date of Birth: 1996-10-09  Beginning of progress report period: April 14, 2019 End of progress report period: April 21, 2019  Today's Date: 04/21/2019 PT Individual Time: 0805-0900 PT Individual Time Calculation (min): 55 min   Pt is making fair progress towards LTG's as he currently can ambulate with/without RW with supervision (pt elects to use RW intermittently to alleviate LLE pain). Pt can negotiate stairs with B rails and CGA but would benefit from practicing stair negotiation without rails as his aunt's house has 3 STE without rails. Pt is demonstrating improved sustained & selective attention and therapy continues to address awareness re situation. Pt would benefit from continued skilled PT treatment to focus on balance, strengthening, d/c planning & cognitive remediation.  Pt continues to require max education/cuing re: need to wear c-collar as pt only wears it intermittently.   Patient continues to demonstrate the following deficits muscle weakness, decreased cardiorespiratoy endurance, decreased coordination, decreased attention, decreased awareness, decreased problem solving, decreased safety awareness, decreased memory and delayed processing, and decreased standing balance, decreased postural control, decreased balance strategies and difficulty maintaining precautions and therefore will continue to benefit from skilled PT intervention to increase functional independence with mobility.  Patient progressing toward long term goals..  Continue plan of care.  PT Short Term Goals Week 1:  PT Short Term Goal 1 (Week 1): STG = LTG due to short ELOS. PT Short Term Goal 1 - Progress (Week 1): Met Week 2:  PT Short Term Goal 1 (Week 2): STG = LTG due to short ELOS.  Skilled Therapeutic Interventions/Progress Updates:  Pt received in bed & agreeable to tx but pt not agreeable to practicing stair  negotiation without rails "I'm grown, I don't need anyone to tell me what to do" despite educate re: need to practice stair negotiation to simulate aunt's house. Pt reports he needs to shower prior to going to gym. Pt ambulates around room with mod I & completes shower with door closed, mod I without c-collar donned & pt's abdomen not covered (per OT pt able to shower without abdomen covered). Pt dresses in paper scrubs in bathroom with mod I. Pt performs grooming tasks standing at sink with mod I (brushing teeth) and dons c-collar without cuing to do so. Pt ambulates room>gym>dayroom>room with RW & distant supervision with pt electing to use RW to alleviate LLE weight bearing. Pt negotiates 4 steps with B rails and supervision with pt unwilling to attempt stair negotiation without rails reporting his aunt has something to hold to or a ramp to enter the house or pt reports he will get someone to help him up the steps; pt does show awareness as he recognizes he needs UE support to negotiate stairs. Pt utilizes nu-step on level 4 x 7 with all four extremities with task focusing on global strengthening. At end of session pt left sitting on EOB with call bell in reach, pt elects to doff c-collar.  No c/o pain reported.  Therapy Documentation Precautions:  Precautions Precautions: Fall, Cervical Precaution Comments: No learned evidence of cervical precuations Required Braces or Orthoses: Cervical Brace Cervical Brace: Hard collar, At all times Restrictions Weight Bearing Restrictions: Yes LLE Weight Bearing: Weight bearing as tolerated    Therapy/Group: Individual Therapy  [name] 04/21/2019, 9:02 AM

## 2019-04-21 NOTE — Progress Notes (Signed)
Occupational Therapy Session Note  Patient Details  Name: Jeremy [name] MRN: [id number] Date of Birth: Dec 20, 1995  Today's Date: 04/21/2019 OT Individual Time: 1100-1155 OT Individual Time Calculation (min): 55 min    Skilled Therapeutic Interventions/Progress Updates:    1;1. Pt received in bed and agreeable to OT. Pt reports need to complete toileting. Pt reminded to don C collar and pt applies. Pt completes toileting MOD I. Pt completes ambulation with RW to gym with MOD I. Pt completes standing balance activites on Wii fit board with and without support on RW. Pt completes walking forward bouncing ball to +2 while naming words (supposed to be names) starting with a letter in alphabetical order. Attempted to do the activity walking backwards, however pt says, "I feel it too much in my leg." Pt completes 5x10 min kinetron on 30 cm/second with VC for not holding onto handles to improve core engagement and weight shift forward to localize activation of glutes. Pt completes 5# dowel rod HEP with demonstration cues for global strengthening of BUE required for BADLs. Exited session with pt seated in bed exit alarm on and call light in reach  Therapy Documentation Precautions:  Precautions Precautions: Fall, Cervical Precaution Comments: No learned evidence of cervical precuations Required Braces or Orthoses: Cervical Brace Cervical Brace: Hard collar, At all times Restrictions Weight Bearing Restrictions: Yes LLE Weight Bearing: Weight bearing as tolerated General:   Vital Signs:   Pain:   ADL: ADL Upper Body Bathing: Moderate cueing, Minimal assistance Where Assessed-Upper Body Bathing: Edge of bed Upper Body Dressing: Supervision/safety Where Assessed-Upper Body Dressing: Edge of bed Lower Body Dressing: Moderate assistance Where Assessed-Lower Body Dressing: Edge of bed Toilet Transfer: Minimal assistance Toilet Transfer Method: Ambulating Vision   Perception    Praxis    Exercises:   Other Treatments:     Therapy/Group: Individual Therapy  Tonny Branch 04/21/2019, 12:04 PM

## 2019-04-21 NOTE — Progress Notes (Signed)
Slept for 5 hours in latter part of shift. Medicated x2 for c/o headache-effective. Continues to wear C-collar, educated on risks associated with fracture and noncompliance. Pt continues to refuse. No inappropriate behaviors noted. No dressing noted to abdominal incision. Per pt and previous nurse, no dressing needed. Pt states "the doctor said to leave it open". Area healing well, no drainage noted. Pt declined dressing.
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
## 2019-04-21 NOTE — Progress Notes (Signed)
Speech Language Pathology Daily Session Note  Patient Details  Name: Jeremy [name] MRN: [id number] Date of Birth: 02-14-1996  Today's Date: 04/21/2019 SLP Individual Time: 1400-1430 SLP Individual Time Calculation (min): 30 min  Missed Time: 30 minutes due to pain in neck and ribs   Short Term Goals: Week 2: SLP Short Term Goal 1 (Week 2): STGs=LTGs  Skilled Therapeutic Interventions:Skilled treatment session focused on cognitive goals. SLP facilitated session by providing Min A verbal cues for recall of procedures to a previously learned task and supervision verbal cues for problem solving. Patient also participated in a memory task in which he required Min A verbal cues for recall after a 10 minute delay. Patient restless throughout session while sitting EOB due to pain and requested to lay back down. Patient missed 30 minutes of session due to pain, RN aware. Patient left supine in bed with alarm on and all needs within reach. Continue with current plan of care.      Pain Pain in neck and ribs. Patient premedicated but requested additional medications from RN if possible. Patient repositioned.   Therapy/Group: Individual Therapy  Katilin Raynes 04/21/2019, 2:33 PM

## 2019-04-21 NOTE — Progress Notes (Signed)
Occupational Therapy Session Note  Patient Details  Name: Jeremy [name] MRN: [id number] Date of Birth: 1996-08-01  Today's Date: 04/21/2019 OT Individual Time: 8325-4982 OT Individual Time Calculation (min): 25 min    Short Term Goals: Week 1:  OT Short Term Goal 1 (Week 1): Pt will sequence bathing body parts with no more than min VC OT Short Term Goal 1 - Progress (Week 1): Met OT Short Term Goal 2 (Week 1): Pt will thread BLE into pants wiht supervision OT Short Term Goal 2 - Progress (Week 1): Met OT Short Term Goal 3 (Week 1): Pt will sustain attention to task for <5 min wiht min VC OT Short Term Goal 3 - Progress (Week 1): Met OT Short Term Goal 4 (Week 1): Pt will transfer to toilet with CGA at ambualtory level with LRAD OT Short Term Goal 4 - Progress (Week 1): Met  Skilled Therapeutic Interventions/Progress Updates:    1;1. Pt received in bed agreeable to OT, however when getting up to go to the bathroom pt refuses C collar. Despite edu, pt states, "I dont need it in the bed or to go to the bathroom, I only need it in therapy. I know when I need it." Pt sits EOB with C collar donned to play game of uno-jenga with min VC for following rules. Pt able to attend to task ~15 min seated EOB with TV on in background for selective attention. Pt ambulates 13fet with RW at MOD I level without collar despite cueing to don. Exited session with pt seated in bed, exit alarm on and call light in reach  Therapy Documentation Precautions:  Precautions Precautions: Fall, Cervical Precaution Comments: No learned evidence of cervical precuations Required Braces or Orthoses: Cervical Brace Cervical Brace: Hard collar, At all times Restrictions Weight Bearing Restrictions: Yes LLE Weight Bearing: Weight bearing as tolerated General:   Vital Signs:   Pain: Pain Assessment Pain Scale: 0-10 Pain Score: Asleep ADL: ADL Upper Body Bathing: Moderate cueing, Minimal assistance Where  Assessed-Upper Body Bathing: Edge of bed Upper Body Dressing: Supervision/safety Where Assessed-Upper Body Dressing: Edge of bed Lower Body Dressing: Moderate assistance Where Assessed-Lower Body Dressing: Edge of bed Toilet Transfer: Minimal assistance Toilet Transfer Method: Ambulating Vision   Perception    Praxis   Exercises:   Other Treatments:     Therapy/Group: Individual Therapy  STonny Branch6/19/2020, 1:57 PM

## 2019-04-22 ENCOUNTER — Inpatient Hospital Stay (HOSPITAL_COMMUNITY): Payer: Medicaid Other | Admitting: Speech Pathology

## 2019-04-22 ENCOUNTER — Inpatient Hospital Stay (HOSPITAL_COMMUNITY): Payer: Medicaid Other
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
## 2019-04-22 NOTE — Progress Notes (Signed)
Occupational Therapy Session Note  Patient Details  Name: Jeremy [name] MRN: [id number] Date of Birth: 1996/03/04  Today's Date: 04/22/2019 OT Individual Time: 1401-1456 OT Individual Time Calculation (min): 55 min    Short Term Goals: Week 2:    stg=ltg d/t ELOS  Skilled Therapeutic Interventions/Progress Updates:    Pt received in bed with RW present already wearing cervical collar. Pt with no report of pain but requesting to shower. Pt gathers lotion and clothing  When entering bathroom. OT provides question cue regarding soap and pt motions to lotion indicating he has soap. Pt enters bathroom and closes door behind him. Moments later pt exits bathroom retrieving soap off of countertop sink. Pt elects to doff C collar during shower (audible velcro being undone) and showers/dresses with MOD I. PT exits bathroom dressed with Collar donned.  Pt grooms in standing with MOD I. Pt completes 10 min nustep on workload 5 for 5 min and 6 for 5 min for selective attention in minimally distracting environment. Pt tasked with indicating when 5 min is finished without cuing. Pt completes step ups with 5 # weights in B hands at sides for LE strenthening 3x10 each LE and grapevine lateral walking at rail for dynamic balance with supervision. Pt unable to maintain balance with grapevine without holding onto rail. Exited session with pt seated in bed and all needs in reach  Therapy Documentation Precautions:  Precautions Precautions: Fall, Cervical Precaution Comments: No learned evidence of cervical precuations Required Braces or Orthoses: Cervical Brace Cervical Brace: Hard collar, At all times Restrictions Weight Bearing Restrictions: Yes LLE Weight Bearing: Weight bearing as tolerated General:   Vital Signs:   Pain:   ADL: ADL Upper Body Bathing: Moderate cueing, Minimal assistance Where Assessed-Upper Body Bathing: Edge of bed Upper Body Dressing: Supervision/safety Where Assessed-Upper  Body Dressing: Edge of bed Lower Body Dressing: Moderate assistance Where Assessed-Lower Body Dressing: Edge of bed Toilet Transfer: Minimal assistance Toilet Transfer Method: Ambulating Vision   Perception    Praxis   Exercises:   Other Treatments:     Therapy/Group: Individual Therapy  Tonny Branch 04/22/2019, 2:06 PM

## 2019-04-22 NOTE — Progress Notes (Signed)
Speech Language Pathology Daily Session Note  Patient Details  Name: Jeremy [name] MRN: [id number] Date of Birth: 14-Dec-1995  Today's Date: 04/22/2019 SLP Individual Time: 1300-1340 SLP Individual Time Calculation (min): 40 min  Short Term Goals: Week 2: SLP Short Term Goal 1 (Week 2): STGs=LTGs  Skilled Therapeutic Interventions: Skilled treatment session focused on cognitive goals. Upon arrival, patient was supine in bed with his C-collar in place. SLP facilitated session by administering the Cognistat. Patient scored [facility] for all subtests with the exception of short-term memory in which he demonstrated a mild impairment. Patient educated on results. Patient independently utilized his schedule to anticipate upcoming sessions and requested SLP to get him some new paper scrubs in anticipation of taking a shower during his upcoming OT session.  Patient left supine in bed with c-collar on and all needs within reach. Continue with current plan of care.      Pain No/Denies Pain   Therapy/Group: Individual Therapy  [name] 04/22/2019, 2:12 PM

## 2019-04-22 NOTE — Plan of Care (Signed)
  Problem: Consults Goal: RH GENERAL PATIENT EDUCATION Description: See Patient Education module for education specifics. Outcome: Progressing   Problem: RH BOWEL ELIMINATION Goal: RH STG MANAGE BOWEL WITH ASSISTANCE Description: STG Manage Bowel with mod I Assistance.  Outcome: Progressing   Problem: RH SKIN INTEGRITY Goal: RH STG SKIN FREE OF INFECTION/BREAKDOWN Description: Cues/reminders  Outcome: Progressing Goal: RH STG MAINTAIN SKIN INTEGRITY WITH ASSISTANCE Description: STG Maintain Skin Integrity With cues/reminders Assistance.  Outcome: Progressing Goal: RH STG ABLE TO PERFORM INCISION/WOUND CARE W/ASSISTANCE Description: STG Able To Perform Incision/Wound Care With min Assistance.  Outcome: Progressing   Problem: RH SAFETY Goal: RH STG ADHERE TO SAFETY PRECAUTIONS W/ASSISTANCE/DEVICE Description: STG Adhere to Safety Precautions With cues/reminders Assistance/Device.  Outcome: Progressing   Problem: RH PAIN MANAGEMENT Goal: RH STG PAIN MANAGED AT OR BELOW PT'S PAIN GOAL Description: At or below level 4  Outcome: Progressing   Problem: RH KNOWLEDGE DEFICIT GENERAL Goal: RH STG INCREASE KNOWLEDGE OF SELF CARE AFTER HOSPITALIZATION Description: Independently using handouts and educational materials  Outcome: Progressing   

## 2019-04-23 ENCOUNTER — Inpatient Hospital Stay (HOSPITAL_COMMUNITY): Payer: Medicaid Other | Admitting: Occupational Therapy

## 2019-04-23 DIAGNOSIS — I824Z1 Acute embolism and thrombosis of unspecified deep veins of right distal lower extremity: Secondary | ICD-10-CM

## 2019-04-23 DIAGNOSIS — Z720 Tobacco use: Secondary | ICD-10-CM

## 2019-04-23 NOTE — Plan of Care (Signed)
  Problem: Consults Goal: RH GENERAL PATIENT EDUCATION Description: See Patient Education module for education specifics. Outcome: Progressing   Problem: RH BOWEL ELIMINATION Goal: RH STG MANAGE BOWEL WITH ASSISTANCE Description: STG Manage Bowel with mod I Assistance.  Outcome: Progressing   Problem: RH SKIN INTEGRITY Goal: RH STG SKIN FREE OF INFECTION/BREAKDOWN Description: Cues/reminders  Outcome: Progressing Goal: RH STG MAINTAIN SKIN INTEGRITY WITH ASSISTANCE Description: STG Maintain Skin Integrity With cues/reminders Assistance.  Outcome: Progressing Goal: RH STG ABLE TO PERFORM INCISION/WOUND CARE W/ASSISTANCE Description: STG Able To Perform Incision/Wound Care With min Assistance.  Outcome: Progressing   Problem: RH SAFETY Goal: RH STG ADHERE TO SAFETY PRECAUTIONS W/ASSISTANCE/DEVICE Description: STG Adhere to Safety Precautions With cues/reminders Assistance/Device.  Outcome: Progressing   Problem: RH PAIN MANAGEMENT Goal: RH STG PAIN MANAGED AT OR BELOW PT'S PAIN GOAL Description: At or below level 4  Outcome: Progressing   Problem: RH KNOWLEDGE DEFICIT GENERAL Goal: RH STG INCREASE KNOWLEDGE OF SELF CARE AFTER HOSPITALIZATION Description: Independently using handouts and educational materials  Outcome: Progressing   

## 2019-04-23 NOTE — Progress Notes (Signed)
Occupational Therapy Session Note  Patient Details  Name: Jeremy [name] MRN: [id number] Date of Birth: 02-07-1996  Today's Date: 04/23/2019 OT Individual Time: 8889-1694 OT Individual Time Calculation (min): 60 min    Short Term Goals: Week 2:    STG=LTG due to LOS  Skilled Therapeutic Interventions/Progress Updates:    Pt seen for OT ADL bathing/dressing session. Pt awake sitting EOB upon arrival with cervical brace donned. Pt pleasant and requesting to shower. He appropriately asked for new scrub outfit and socks. He ambulated throughout room with supervision, no AD. Increased time to identify soap, lotion, and deoderant in prep for showering task. Pt requesting therapist to stay outside bathroom door while he showered, stating he understood why therapist needed to be present, however, requesting some privacy. He bathed and dressed mod I in bathroom. He exited bathroom with C-collar re-applied (taken off in shower). Completed grooming tasks standing at sink mod I. He requested to leave room for activity, knowing it was his only therapy session of the day and wanting a change of scenery. He initiated use of RW for longer distance ambulation and ambulated mod I throughout unit with RW to therapy day room. Completed x4 Zameer Borman of Bean bag/ corn hole toss game. He took turns appropriately during game. He was able to complete simple addition in order to keep score independently, hwoever, required min cuing to recall scores btwn Cordarro Spinnato. Seated rest break provided and then pt returned to room, requesting to return to supine. Pt removed C-collar and returned to supine with all needs in reach. Pt oriented to person, situation including d/c plan, and month day and year.    Therapy Documentation Precautions:  Precautions Precautions: Fall, Cervical Precaution Comments: No learned evidence of cervical precuations Required Braces or Orthoses: Cervical Brace Cervical Brace: Hard collar, At all  times Restrictions Weight Bearing Restrictions: Yes LLE Weight Bearing: Weight bearing as tolerated   Therapy/Group: Individual Therapy  Tam Delisle L 04/23/2019, 6:57 AM
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
## 2019-04-24 ENCOUNTER — Inpatient Hospital Stay (HOSPITAL_COMMUNITY): Payer: Medicaid Other

## 2019-04-24 ENCOUNTER — Inpatient Hospital Stay (HOSPITAL_COMMUNITY): Payer: Medicaid Other | Admitting: Physical Therapy

## 2019-04-24 ENCOUNTER — Inpatient Hospital Stay (HOSPITAL_COMMUNITY): Payer: Medicaid Other | Admitting: Speech Pathology

## 2019-04-24 MED ORDER — PROPRANOLOL HCL 20 MG PO TABS: 20.0000 mg | ORAL_TABLET | Freq: Three times a day (TID) | ORAL | 0 refills | Status: DC

## 2019-04-24 MED ORDER — METHOCARBAMOL 750 MG PO TABS: 750.0000 mg | ORAL_TABLET | Freq: Three times a day (TID) | ORAL | 0 refills | Status: DC

## 2019-04-24 MED ORDER — ACETAMINOPHEN 325 MG PO TABS: 650.0000 mg | ORAL_TABLET | ORAL | Status: DC | PRN

## 2019-04-24 NOTE — Progress Notes (Signed)
Speech Language Pathology Discharge Summary  Patient Details  Name: Jeremy [name] MRN: [id number] Date of Birth: 1995-11-07  Today's Date: 04/25/2019 SLP Individual Time: 1030-1045 SLP Individual Time Calculation (min): 15 min   Skilled Therapeutic Interventions:  Skilled treatment session focused on completion of family education with the patient's aunt. Both the patient and his aunt were educated in regards to patient's current cognitive functioning, memory compensatory strategies and how to implement the strategies at home. Both verbalized understanding of information and handout given to reinforce information. Patient will discharge home with 24 hour supervision from family.    Patient has met 4 of 4 long term goals.  Patient to discharge at overall Supervision level.   Reasons goals not met: N/A   Clinical Impression/Discharge Summary: Patient has made excellent gains and has met 4 of 4 LTGs this admission. Currently, patient demonstrates behaviors consistent with a Rancho Level VIII and requires overall supervision level verbal cues to complete functional and mildly complex tasks safely in regards to problem solving, attention, recall and awareness. Patient and family education is complete and patient will discharge home with 24 hour supervision from family. Patient would benefit from f/u SLP services to maximize his cognitive functioning and overall functional independence in order to reduce caregiver burden.   Care Partner:  Caregiver Able to Provide Assistance: Yes  Type of Caregiver Assistance: Cognitive  Recommendation:  24 hour supervision/assistance;Home Health SLP;Outpatient SLP  Rationale for SLP Follow Up: Maximize cognitive function and independence   Equipment: N/A   Reasons for discharge: Discharged from hospital;Treatment goals met   Patient/Family Agrees with Progress Made and Goals Achieved: Yes    Tra Wilemon, [place] 04/24/2019, 6:42 AM

## 2019-04-24 NOTE — Progress Notes (Signed)
Physical Therapy Session Note  Patient Details  Name: Jeremy [name] MRN: [id number] Date of Birth: 01-16-1996  Today's Date: 04/24/2019 PT Individual Time: 5852-7782 PT Individual Time Calculation (min): 53 min   Short Term Goals: Week 2:  PT Short Term Goal 1 (Week 2): STG = LTG due to short ELOS.  Skilled Therapeutic Interventions/Progress Updates:  Pt received in bed with c collar already donned.  Pt completes bed mobility with mod I and sit<>stand independently. Pt still choosing to use RW for gait and ambulates around unit with distant supervision for balance. Pt completes car transfer at sedan simulated height with supervision and negotiates ramp with RW & supervision. Instructed pt on compensatory pattern when negotiating stairs without rails but pt continues to hold to B rails & negotiates 12 steps (6" + 3") with supervision. Pt refused to ambulate over mulch with RW but does ambulate over uneven mat on floor without AD & supervision. Pt completes Berg Balance test & scores 46/56; educated pt on current fall risk based on score. Patient demonstrates increased fall risk as noted by score of 46/56 on Berg Balance Scale.  (<36= high risk for falls, close to 100%; 37-45 significant >80%; 46-51 moderate >50%; 52-55 lower >25%). Pt engaged in puzzle assembly with sustained attention to task ~12 minutes in a somewhat distracting environment without cuing, but pt requires mod assist to assemble puzzle. Attempted to have pt engage in dual cognitive task & tossing ball but pt reporting pain and requesting to return to room. Pt left in bed with all needs in reach. Pt mod I in room.  Pain: pt with c/o neck pain but does not rate - RN made aware.  Pt with improving cognition as he is able to recall therapist's name and the date without assistance.   Therapy Documentation Precautions:  Precautions Precautions: Fall, Cervical Precaution Comments: Pt becoming more compliant with wearing cervical  brace Required Braces or Orthoses: Cervical Brace Cervical Brace: Hard collar, At all times Restrictions Weight Bearing Restrictions: Yes LLE Weight Bearing: Weight bearing as tolerated    Balance: Balance Balance Assessed: Yes Standardized Balance Assessment Standardized Balance Assessment: Berg Balance Test Berg Balance Test Sit to Stand: Able to stand without using hands and stabilize independently Standing Unsupported: Able to stand safely 2 minutes Sitting with Back Unsupported but Feet Supported on Floor or Stool: Able to sit safely and securely 2 minutes Stand to Sit: Sits safely with minimal use of hands Transfers: Able to transfer safely, minor use of hands Standing Unsupported with Eyes Closed: Able to stand 10 seconds with supervision Standing Ubsupported with Feet Together: Able to place feet together independently and stand for 1 minute with supervision From Standing, Reach Forward with Outstretched Arm: Can reach confidently >25 cm (10") From Standing Position, Pick up Object from Floor: Able to pick up shoe safely and easily From Standing Position, Turn to Look Behind Over each Shoulder: Needs assist to keep from losing balance and falling(not attempted to help maintain cervical precautions) Turn 360 Degrees: Able to turn 360 degrees safely in 4 seconds or less Standing Unsupported, Alternately Place Feet on Step/Stool: Able to stand independently and complete 8 steps >20 seconds Standing Unsupported, One Foot in Front: Able to take small step independently and hold 30 seconds Standing on One Leg: Able to lift leg independently and hold 5-10 seconds(pt recognizes need to stand on RLE) Total Score: 46    Therapy/Group: Individual Therapy  [name] 04/24/2019, 12:20 PM

## 2019-04-24 NOTE — Progress Notes (Signed)
Occupational Therapy Session Note  Patient Details  Name: Jeremy [name] MRN: [id number] Date of Birth: 05-28-1996  Today's Date: 04/24/2019 OT Individual Time: 8144-8185 OT Individual Time Calculation (min): 75 min    Short Term Goals: Week 2:  OT Short Term Goal 1 (Week 2): STG=LTG due to LOS  Skilled Therapeutic Interventions/Progress Updates:    Pt easily awoken to vc. Pt requesting shower. Pt completed ambulatory transfer into shower with mod I. He was able to complete all bathing and dressing at mod I level. Pt completed 200 ft of functional mobility with RW to therapy gym. (S) provided and cueing for technique during dynamic standing balance activity with focus on unilateral LE stance. Pt then completed Wii bowling game while standing on non-compliant surface to challenge dynamic standing balance. Pt appropriately interacted with staff throughout session, taking turns and sharing remote. Pt ended session with 10 min on NuStep to increase cardiovascular endurance. Pt demonstrated great improvements this session with cervical brace compliance, functional activity tolerance, and social interaction. Pt made mod I in room per discussion with nursing and PT. Pt returned to room and left sitting EOB, all needs within reach.   Therapy Documentation Precautions:  Precautions Precautions: Fall, Cervical Precaution Comments: Pt becoming more compliant with wearing cervical brace Required Braces or Orthoses: Cervical Brace Cervical Brace: Hard collar, At all times Restrictions Weight Bearing Restrictions: Yes LLE Weight Bearing: Weight bearing as tolerated   Therapy/Group: Individual Therapy  Curtis Sites 04/24/2019, 8:37 AM
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
## 2019-04-24 NOTE — Progress Notes (Signed)
Pt stated he voided through the night but no BM. Pt compliant with Aspen Collar all night.

## 2019-04-24 NOTE — Progress Notes (Signed)
Physical Therapy Discharge Summary  Patient Details  Name: Jeremy [name] MRN: [id number] Date of Birth: September 12, 1996  Today's Date: 04/25/2019    Patient has met 8 of 8 long term goals due to improved activity tolerance, improved balance, improved postural control, increased strength, decreased pain, ability to compensate for deficits, functional use of  right upper extremity, right lower extremity, left upper extremity and left lower extremity, improved attention, improved awareness and improved coordination.  Patient to discharge at an ambulatory level Supervision.   Patient's care partner is independent to provide the necessary physical and cognitive assistance at discharge.  Aunt Joycelyn Schmid) reports pt can use ramp vs 3 STE without rails to access home.  Reasons goals not met: n/a  Recommendation:  Patient will benefit from ongoing skilled PT services in home health setting to continue to advance safe functional mobility, address ongoing impairments in balance, gait with LRAD, stair negotiation, cognitive remediation, balance, and minimize fall risk.  Equipment: RW  Reasons for discharge: treatment goals met and discharge from hospital  Patient/family agrees with progress made and goals achieved: Yes  PT Discharge Precautions/Restrictions Precautions Precautions: Fall;Cervical Required Braces or Orthoses: Cervical Brace Cervical Brace: Hard collar;At all times Restrictions Weight Bearing Restrictions: Yes LLE Weight Bearing: Weight bearing as tolerated   Vision/Perception  No visual deficits at baseline. No changes in baseline vision. Perception WNL.  Cognition Overall Cognitive Status: Impaired/Different from baseline Arousal/Alertness: Awake/alert Orientation Level: Oriented to person;Oriented to place;Oriented to time;Oriented to situation;Oriented X4 Attention: Sustained Sustained Attention: Appears intact Sustained Attention Impairment: Functional basic Memory  Impairment: Decreased short term memory;Decreased recall of new information Awareness: Impaired Awareness Impairment: Emergent impairment;Anticipatory impairment Problem Solving: Impaired Executive Function: Reasoning Reasoning: Impaired Behaviors: Agitated Behavior Scale;Restless;Impulsive Safety/Judgment: Impaired Comments: Pt more compliant with wearing cervical collar. Pt more willing to take direction/cueing but still presents with impaired safety awareness and overestimation of abilities. Rancho [employer] Scales of Cognitive Functioning: Purposeful/appropriate  Sensation Sensation Light Touch: Appears Intact Proprioception: Appears Intact Coordination Gross Motor Movements are Fluid and Coordinated: Yes Fine Motor Movements are Fluid and Coordinated: Yes   Motor  Motor Motor: Within Functional Limits Motor - Skilled Clinical Observations: generalized deconditioning Motor - Discharge Observations: Pt near baseline, limited by pain in neck and LLE   Mobility Bed Mobility Bed Mobility: Sit to Supine;Supine to Sit Supine to Sit: Independent Sit to Supine: Independent Transfers Transfers: Sit to Stand;Stand to Sit Sit to Stand: Independent Stand to Sit: Independent Transfer (Assistive device): None   Locomotion  Gait Ambulation: Yes Gait Assistance: Supervision/Verbal cueing Gait Distance (Feet): 200 Feet Assistive device: Rolling walker(pt chooses to use RW to help alleviate LLE pain) Gait Gait: Yes Gait Pattern: (decreased weight shifting LLE) Gait velocity: decreased Stairs / Additional Locomotion Stairs: Yes Stairs Assistance: Supervision/Verbal cueing Stair Management Technique: Two rails Number of Stairs: 12 Height of Stairs: (6" + 3") Ramp: Supervision/Verbal cueing(ambulatory with RW) Wheelchair Mobility Wheelchair Mobility: No   Trunk/Postural Assessment  Cervical Assessment Cervical Assessment: Exceptions to WFL(cervical restrictions) Thoracic  Assessment Thoracic Assessment: Within Functional Limits Lumbar Assessment Lumbar Assessment: Within Functional Limits Postural Control Postural Control: Deficits on evaluation Righting Reactions: Delayed 2/2 cervical precautions and LLE fx Protective Responses: delayed   Balance Balance Balance Assessed: Yes Standardized Balance Assessment Standardized Balance Assessment: Berg Balance Test Berg Balance Test Sit to Stand: Able to stand without using hands and stabilize independently Standing Unsupported: Able to stand safely 2 minutes Sitting with Back Unsupported but Feet Supported on Floor  or Stool: Able to sit safely and securely 2 minutes Stand to Sit: Sits safely with minimal use of hands Transfers: Able to transfer safely, minor use of hands Standing Unsupported with Eyes Closed: Able to stand 10 seconds with supervision Standing Ubsupported with Feet Together: Able to place feet together independently and stand for 1 minute with supervision From Standing, Reach Forward with Outstretched Arm: Can reach confidently >25 cm (10") From Standing Position, Pick up Object from Floor: Able to pick up shoe safely and easily From Standing Position, Turn to Look Behind Over each Shoulder: Needs assist to keep from losing balance and falling(not attempted to help maintain cervical precautions) Turn 360 Degrees: Able to turn 360 degrees safely in 4 seconds or less Standing Unsupported, Alternately Place Feet on Step/Stool: Able to stand independently and complete 8 steps >20 seconds Standing Unsupported, One Foot in Front: Able to take small step independently and hold 30 seconds Standing on One Leg: Able to lift leg independently and hold 5-10 seconds(pt recognizes need to stand on RLE) Total Score: 46  Extremity Assessment  RUE Assessment RUE Assessment: Within Functional Limits LUE Assessment LUE Assessment: Within Functional Limits RLE Assessment RLE Assessment: Within Functional  Limits LLE Assessment LLE Assessment: Within Functional Limits General Strength Comments: functional, not formally tested, decreased weight shifting 2/2 pain in weight bearing    [name] 04/25/2019, 11:24 AM
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
## 2019-04-24 NOTE — Progress Notes (Signed)
Speech Language Pathology Daily Session Note  Patient Details  Name: Jeremy [name] MRN: [id number] Date of Birth: Mar 21, 1996  Today's Date: 04/24/2019 SLP Individual Time: 1400-1455 SLP Individual Time Calculation (min): 55 min  Short Term Goals: Week 2: SLP Short Term Goal 1 (Week 2): STGs=LTGs  Skilled Therapeutic Interventions: Skilled treatment session focused on cognitive goals. SLP facilitated session by providing extra time and supervision-Min A verbal cues for complex problem solving during a scheduling task and a deductive reasoning task. Patient also educated on memory compensatory strategies. SLP called the patient's aunt to educate patient on current behaviors and strategies to utilize to decrease frustration. She verbalized understanding of all information. Patient left upright in bed with all needs within reach. Continue with current plan of care.      Pain No/Denies Pain   Therapy/Group: Individual Therapy  Freddi Schrager 04/24/2019, 8:30 AM

## 2019-04-25 ENCOUNTER — Encounter (HOSPITAL_COMMUNITY): Payer: Self-pay | Admitting: Emergency Medicine

## 2019-04-25 ENCOUNTER — Encounter (HOSPITAL_COMMUNITY): Payer: Medicaid Other | Admitting: Speech Pathology

## 2019-04-25 ENCOUNTER — Ambulatory Visit (HOSPITAL_COMMUNITY): Payer: Medicaid Other | Admitting: Physical Therapy

## 2019-04-25 ENCOUNTER — Encounter (HOSPITAL_COMMUNITY): Payer: Medicaid Other

## 2019-04-25 MED ORDER — METHOCARBAMOL 750 MG PO TABS: 750.0000 mg | ORAL_TABLET | Freq: Three times a day (TID) | ORAL | 0 refills | Status: DC

## 2019-04-25 MED ORDER — TOPIRAMATE 25 MG PO TABS: 25.0000 mg | ORAL_TABLET | Freq: Every day | ORAL | 0 refills | Status: DC

## 2019-04-25 MED ORDER — PROPRANOLOL HCL 20 MG PO TABS: 20.0000 mg | ORAL_TABLET | Freq: Three times a day (TID) | ORAL | 0 refills | Status: DC

## 2019-04-25 MED ORDER — OXYCODONE HCL 5 MG PO TABS: 10.0000 mg | ORAL_TABLET | ORAL | 0 refills | Status: DC | PRN

## 2019-04-25 MED ORDER — TRAZODONE HCL 50 MG PO TABS: 50.0000 mg | ORAL_TABLET | Freq: Every day | ORAL | 0 refills | Status: DC

## 2019-04-25 MED ORDER — QUETIAPINE FUMARATE 100 MG PO TABS: 100.0000 mg | ORAL_TABLET | Freq: Every day | ORAL | 0 refills | Status: DC

## 2019-04-25 MED ORDER — QUETIAPINE FUMARATE 50 MG PO TABS: 50.0000 mg | ORAL_TABLET | Freq: Every day | ORAL | 0 refills | Status: DC

## 2019-04-25 MED ORDER — SENNOSIDES-DOCUSATE SODIUM 8.6-50 MG PO TABS: 2.0000 | ORAL_TABLET | Freq: Every day | ORAL | Status: DC

## 2019-04-25 NOTE — Plan of Care (Signed)
  Problem: Consults Goal: RH GENERAL PATIENT EDUCATION Description: See Patient Education module for education specifics. Outcome: Completed/Met   Problem: RH BOWEL ELIMINATION Goal: RH STG MANAGE BOWEL WITH ASSISTANCE Description: STG Manage Bowel with mod I Assistance.  Outcome: Completed/Met   Problem: RH SKIN INTEGRITY Goal: RH STG SKIN FREE OF INFECTION/BREAKDOWN Description: Cues/reminders  Outcome: Completed/Met Goal: RH STG MAINTAIN SKIN INTEGRITY WITH ASSISTANCE Description: STG Maintain Skin Integrity With cues/reminders Assistance.  Outcome: Completed/Met Goal: RH STG ABLE TO PERFORM INCISION/WOUND CARE W/ASSISTANCE Description: STG Able To Perform Incision/Wound Care With min Assistance.  Outcome: Completed/Met   Problem: RH SAFETY Goal: RH STG ADHERE TO SAFETY PRECAUTIONS W/ASSISTANCE/DEVICE Description: STG Adhere to Safety Precautions With cues/reminders Assistance/Device.  Outcome: Completed/Met   Problem: RH PAIN MANAGEMENT Goal: RH STG PAIN MANAGED AT OR BELOW PT'S PAIN GOAL Description: At or below level 4  Outcome: Completed/Met   Problem: RH KNOWLEDGE DEFICIT GENERAL Goal: RH STG INCREASE KNOWLEDGE OF SELF CARE AFTER HOSPITALIZATION Description: Independently using handouts and educational materials  Outcome: Completed/Met  Pt to DC today. Pt ready. Has family Ed this morning. Pt denies any pain. Mod I in the room. Will f/up after fam ed for DC teaching. Erie Noe, RN

## 2019-04-25 NOTE — Progress Notes (Signed)
Pt DC today. Aunt present for DC summary/teaching. Belongings packed and ready to go. Will be taken to main lobby for DC.  Erie Noe, RN

## 2019-04-25 NOTE — Progress Notes (Signed)
Physical Therapy Session Note  Patient Details  Name: Jeremy [name] MRN: [id number] Date of Birth: 10-20-1996  Today's Date: 04/25/2019 PT Individual Time: 1102-1111 PT Individual Time Calculation (min): 9 min   Short Term Goals: Week 2:  PT Short Term Goal 1 (Week 2): STG = LTG due to short ELOS.  Skilled Therapeutic Interventions/Progress Updates:  Pt received sitting on EOB with aunt Joycelyn Schmid) present for caregiver training. No c/o pain reported but pt & aunt both report pt is ready to d/c home. Pt ambulates around room/bathroom without AD & mod I to perform toileting tasks. Educated pt & Margaret on recommendation of 24/7 supervision, pt isn't allowed to drive until cleared by MD, Merrilee Jansky Balance score & current fall risk, home modifications to reduce tripping hazards (remove throw rugs, ensure pets do not get underfoot), pt's preference to use RW for long distance gait 2/2 LLE pain & need to wear cervical collar at all times. Educated them that pt has only negotiated steps with B rails but aunt previously reported she has 3 STE without rails but today Joycelyn Schmid reports she has a small ramp pt can use to access the house & she reports she is not concerned with getting him in the house. Reviewed DME (RW for long distance gait) & HHPT f/u. Pt & aunt report pt plans to d/c & smoke a cigarette with therapist educating them both on benefits of not smoking but with them clearly stating pt will. Pt & aunt voice no concerns or questions regarding d/c & pt ready to go. Pt left in room with aunt present.  Therapy Documentation Precautions:  Precautions Precautions: Fall, Cervical Precaution Comments: Pt becoming more compliant with wearing cervical brace Required Braces or Orthoses: Cervical Brace Cervical Brace: Hard collar, At all times Restrictions Weight Bearing Restrictions: Yes LLE Weight Bearing: Weight bearing as tolerated   General: PT Amount of Missed Time (min): 21  Minutes     Therapy/Group: Individual Therapy  [name] 04/25/2019, 11:17 AM

## 2019-04-25 NOTE — Discharge Instructions (Signed)
Inpatient Rehab Discharge Instructions  Jeremy [name] Discharge date and time: No discharge date for patient encounter.   Activities/Precautions/ Functional Status: Activity: Weightbearing as tolerated.  Cervical collar at all times Diet: regular diet Wound Care: none needed Functional status:  ___ No restrictions     ___ Walk up steps independently ___ 24/7 supervision/assistance   ___ Walk up steps with assistance ___ Intermittent supervision/assistance  ___ Bathe/dress independently ___ Walk with walker     _x__ Bathe/dress with assistance ___ Walk Independently    ___ Shower independently ___ Walk with assistance    ___ Shower with assistance ___ No alcohol     ___ Return to work/school ________     COMMUNITY REFERRALS UPON DISCHARGE:    Home Health:   PT     OT     ST                      Agency:  [facility]    Phone: [phone]   Medical Equipment/Items Ordered:  Rolling walker                                                      Agency/Supplier:  [facility] @ [phone]   GENERAL COMMUNITY RESOURCES FOR PATIENT/FAMILY:  Support Groups:  [facility]     Special Instructions: No smoking driving or alcohol   My questions have been answered and I understand these instructions. I will adhere to these goals and the provided educational materials after my discharge from the hospital.  Patient/Caregiver Signature _______________________________ Date __________  Clinician Signature _______________________________________ Date __________  Please bring this form and your medication list with you to all your follow-up doctor's appointments.

## 2019-04-25 NOTE — Progress Notes (Addendum)
Social Work  Discharge Note  The overall goal for the admission was met for:   Discharge location: Yes -home with aunt who will provide 24/7 supervision  Length of Stay: Yes - 13 days  Discharge activity level: Yes - supervision  Home/community participation: Yes  Services provided included: MD, RD, PT, OT, SLP, RN, TR, Pharmacy and SW  Financial Services: Medicaid  Follow-up services arranged: Home Health: PT, OT, ST via [facility], DME: rolling walker via [facility] and Patient/Family has no preference for HH/DME agencies  Referred pt to [facility] for new primary medical home.  Comments (or additional information):      Contact info:  Merilynn Finland @ [phone]  Patient/Family verbalized understanding of follow-up arrangements: Yes  Individual responsible for coordination of the follow-up plan: pt/ aunt  Confirmed correct DME delivered: Lennart Pall 04/25/2019    Kacia Halley, Lorre Nick
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
## 2019-04-26 ENCOUNTER — Telehealth: Payer: Self-pay | Admitting: Registered Nurse

## 2019-04-26 NOTE — Telephone Encounter (Signed)
Transitional Care call Transitional Questions Answered by  [facility])   Patient name: Jeremy [name]  DOB: 1996-09-15 1. Are you/is patient experiencing any problems since coming home? No a. Are there any questions regarding any aspect of care? No 2. Are there any questions regarding medications administration/dosing? No a. Are meds being taken as prescribed? Yes b. "Patient should review meds with caller to confirm" Medication List Reviewed 3. Have there been any falls? Ms. Consuello Closs Mr. Sitzmann had a near miss fall, when getting in the bed, he landed on the bed. She states MrElimelech Houseman did not hit the floor.  4. Has Home Health been to the house and/or have they contacted you? No, she was instructed to call office if she doesn't hear from [facility] by tomorrow. She verbalizes understanding.  a. If not, have you tried to contact them? No b. Can we help you contact them? (See above Note 5. Are bowels and bladder emptying properly? Yes a. Are there any unexpected incontinence issues? no b. If applicable, is patient following bowel/bladder programs? No 6. Any fevers, problems with breathing, unexpected pain? No 7. Are there any skin problems or new areas of breakdown? No 8. Has the patient/family member arranged specialty MD follow up (ie cardiology/neurology/renal/surgical/etc.)?  Ms. Linna Darner was instructed to call Dr. Annette Stable and Dr. Doreatha Martin office to schedule HFU appointment, she verbalizes understanding. a. Can we help arrange? No 9. Does the patient need any other services or support that we can help arrange? No 10. Are caregivers following through as expected in assisting the patient? Yes 11. Has the patient quit smoking, drinking alcohol, or using drugs as recommended? Ms. Huey Bienenstock states Mr. Imes smokes but hasn't had any alcohol since discharged. Also states no illicit drug use since discharge.   Appointment date/time 05/08/2019  arrival time 1:20 for 1:40 appointment with  Eunic L Laaibah Wartman ANP-C. [facility]

## 2019-04-27 ENCOUNTER — Emergency Department (HOSPITAL_COMMUNITY): Payer: Medicaid Other

## 2019-04-27 ENCOUNTER — Telehealth: Payer: Self-pay

## 2019-04-27 ENCOUNTER — Encounter (HOSPITAL_COMMUNITY): Payer: Self-pay

## 2019-04-27 ENCOUNTER — Emergency Department (HOSPITAL_COMMUNITY)
Admission: EM | Admit: 2019-04-27 | Discharge: 2019-04-28 | Disposition: A | Discharge status: Home / Self Care | Payer: Medicaid Other | Attending: Emergency Medicine | Admitting: Emergency Medicine

## 2019-04-27 DIAGNOSIS — I10 Essential (primary) hypertension: Secondary | ICD-10-CM | POA: Insufficient documentation

## 2019-04-27 DIAGNOSIS — F17210 Nicotine dependence, cigarettes, uncomplicated: Secondary | ICD-10-CM | POA: Diagnosis not present

## 2019-04-27 DIAGNOSIS — S062X9D Diffuse traumatic brain injury with loss of consciousness of unspecified duration, subsequent encounter: Secondary | ICD-10-CM | POA: Insufficient documentation

## 2019-04-27 DIAGNOSIS — M542 Cervicalgia: Secondary | ICD-10-CM | POA: Insufficient documentation

## 2019-04-27 MED ORDER — OXYCODONE HCL 5 MG PO TABS
10.0000 mg | ORAL_TABLET | Freq: Once | ORAL | Status: AC
  Administered 2019-04-27: 10 mg via ORAL
  Filled 2019-04-27: qty 2
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
## 2019-04-27 NOTE — ED Notes (Signed)
Patient transported to CT scan . 

## 2019-04-27 NOTE — Telephone Encounter (Signed)
Tharon Aquas, PT from [facility] for verbal orders HHPT 1wk3 .Orders approved and given per discharge summary.

## 2019-04-28 ENCOUNTER — Telehealth: Payer: Self-pay | Admitting: *Deleted

## 2019-04-28 MED ORDER — IBUPROFEN 200 MG PO TABS: 400.0000 mg | ORAL_TABLET | Freq: Three times a day (TID) | ORAL | 0 refills | Status: DC | PRN

## 2019-04-28 MED ORDER — OXYCODONE HCL 5 MG PO TABS: 5.0000 mg | ORAL_TABLET | Freq: Four times a day (QID) | ORAL | 0 refills | Status: DC | PRN

## 2019-04-28 MED ORDER — SENNOSIDES-DOCUSATE SODIUM 8.6-50 MG PO TABS: 2.0000 | ORAL_TABLET | Freq: Every day | ORAL | Status: DC

## 2019-04-28 NOTE — Telephone Encounter (Signed)
Ebony Hail ST [facility] called of 1 additonal ST visit . Approval given.

## 2019-05-04 ENCOUNTER — Encounter: Payer: Self-pay | Admitting: Internal Medicine

## 2019-05-04 ENCOUNTER — Ambulatory Visit: Payer: Medicaid Other | Attending: Internal Medicine | Admitting: Internal Medicine

## 2019-05-04 ENCOUNTER — Other Ambulatory Visit: Payer: Self-pay

## 2019-05-04 VITALS — BP 127/83 | HR 72 | Temp 98.2°F | Resp 18 | Ht 72.0 in | Wt 201.0 lb

## 2019-05-04 DIAGNOSIS — N912 Amenorrhea, unspecified: Secondary | ICD-10-CM

## 2019-05-04 DIAGNOSIS — Z87820 Personal history of traumatic brain injury: Secondary | ICD-10-CM | POA: Diagnosis not present

## 2019-05-04 DIAGNOSIS — M542 Cervicalgia: Secondary | ICD-10-CM | POA: Diagnosis not present

## 2019-05-04 DIAGNOSIS — Z79899 Other long term (current) drug therapy: Secondary | ICD-10-CM | POA: Insufficient documentation

## 2019-05-04 DIAGNOSIS — F17210 Nicotine dependence, cigarettes, uncomplicated: Secondary | ICD-10-CM | POA: Diagnosis not present

## 2019-05-04 DIAGNOSIS — I10 Essential (primary) hypertension: Secondary | ICD-10-CM | POA: Diagnosis not present

## 2019-05-04 DIAGNOSIS — D649 Anemia, unspecified: Secondary | ICD-10-CM

## 2019-05-04 DIAGNOSIS — F17200 Nicotine dependence, unspecified, uncomplicated: Secondary | ICD-10-CM

## 2019-05-04 DIAGNOSIS — R739 Hyperglycemia, unspecified: Secondary | ICD-10-CM

## 2019-05-04 NOTE — Progress Notes (Signed)
Order(s) created erroneously. Erroneous order ID: [id number]  Order moved by: Aquarius Latouche R  Order move date/time: 05/04/2019 2:16 PM  Source Patient: [id number]  Source Contact: 05/04/2019  Destination Patient: [id number]  Destination Contact: 01/17/2013 
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
## 2019-05-08 ENCOUNTER — Encounter: Payer: Self-pay | Admitting: Registered Nurse

## 2019-05-08 ENCOUNTER — Encounter: Payer: Medicaid Other | Attending: Registered Nurse | Admitting: Registered Nurse

## 2019-05-08 ENCOUNTER — Other Ambulatory Visit: Payer: Self-pay

## 2019-05-08 VITALS — BP 119/78 | HR 79 | Temp 97.7°F | Ht 72.0 in | Wt 204.0 lb

## 2019-05-08 DIAGNOSIS — S7292XD Unspecified fracture of left femur, subsequent encounter for closed fracture with routine healing: Secondary | ICD-10-CM | POA: Insufficient documentation

## 2019-05-08 DIAGNOSIS — F17210 Nicotine dependence, cigarettes, uncomplicated: Secondary | ICD-10-CM | POA: Insufficient documentation

## 2019-05-08 DIAGNOSIS — S069X9D Unspecified intracranial injury with loss of consciousness of unspecified duration, subsequent encounter: Secondary | ICD-10-CM | POA: Diagnosis present

## 2019-05-08 DIAGNOSIS — T1490XA Injury, unspecified, initial encounter: Secondary | ICD-10-CM | POA: Diagnosis not present

## 2019-05-08 DIAGNOSIS — S12100D Unspecified displaced fracture of second cervical vertebra, subsequent encounter for fracture with routine healing: Secondary | ICD-10-CM | POA: Diagnosis not present

## 2019-05-08 DIAGNOSIS — F319 Bipolar disorder, unspecified: Secondary | ICD-10-CM | POA: Diagnosis not present

## 2019-05-08 DIAGNOSIS — F1010 Alcohol abuse, uncomplicated: Secondary | ICD-10-CM | POA: Insufficient documentation

## 2019-05-08 MED ORDER — TRAMADOL HCL 50 MG PO TABS: 50.0000 mg | ORAL_TABLET | Freq: Two times a day (BID) | ORAL | 1 refills | Status: DC | PRN

## 2019-05-08 NOTE — Patient Instructions (Signed)
Please Call Dr. Annette Stable for hospital Follow up appointment:  Neurosurgery: [address] [phone]  Call Dr. Doreatha Martin: Orthopedic Surgery:  [place].  [phone]
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
## 2019-05-12 ENCOUNTER — Telehealth: Payer: Self-pay

## 2019-05-12 DIAGNOSIS — S12191S Other nondisplaced fracture of second cervical vertebra, sequela: Secondary | ICD-10-CM

## 2019-05-12 DIAGNOSIS — S069X9S Unspecified intracranial injury with loss of consciousness of unspecified duration, sequela: Secondary | ICD-10-CM

## 2019-05-12 NOTE — Telephone Encounter (Signed)
Jeremy [name] [facility] called, stated that all therapies from home health have been discontinued and that she is recommending patient be sent to out-patient therapies for OT, ST, and [name] at this time.

## 2019-05-15 NOTE — Telephone Encounter (Signed)
Referrals made to all 3 ([facility])

## 2019-06-05 ENCOUNTER — Other Ambulatory Visit: Payer: Self-pay

## 2019-06-05 ENCOUNTER — Ambulatory Visit: Payer: Medicaid Other | Attending: Physical Medicine & Rehabilitation | Admitting: Physical Therapy

## 2019-06-05 ENCOUNTER — Encounter: Payer: Self-pay | Admitting: Physical Therapy

## 2019-06-05 DIAGNOSIS — R41844 Frontal lobe and executive function deficit: Secondary | ICD-10-CM | POA: Diagnosis present

## 2019-06-05 DIAGNOSIS — R2689 Other abnormalities of gait and mobility: Secondary | ICD-10-CM

## 2019-06-05 DIAGNOSIS — M6281 Muscle weakness (generalized): Secondary | ICD-10-CM | POA: Diagnosis present

## 2019-06-05 DIAGNOSIS — R41841 Cognitive communication deficit: Secondary | ICD-10-CM | POA: Insufficient documentation

## 2019-06-05 DIAGNOSIS — M542 Cervicalgia: Secondary | ICD-10-CM | POA: Diagnosis present

## 2019-06-05 DIAGNOSIS — M79605 Pain in left leg: Secondary | ICD-10-CM | POA: Insufficient documentation
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
## 2019-06-05 NOTE — Patient Instructions (Signed)
Access Code: [email or web address]  URL: [email or web address]  Date: 06/05/2019  Prepared by: Elsie Ra   Exercises  Supine Active Straight Leg Raise - 10 reps - 1-3 sets - 2x daily - 6x weekly  Sidelying Hip Abduction - 10 reps - 1-3 sets - 2x daily - 6x weekly  Single Leg Stance - 3-5 reps - 1 sets - as long as you can hold - 2x daily - 6x weekly  Lunge with Counter Support - 10 reps - 3 sets - 5 hold - 1x daily - 7x weekly  Lateral Step Up - 10 reps - 3 sets - 2x daily - 6x weekly  Forward Step Up - 10 reps - 3 sets - 5 hold - 1x daily - 7x weekly  Sit to Stand with Rt leg in front - 10 reps - 2-3 sets - 2x daily - 6x weekly

## 2019-06-06 ENCOUNTER — Ambulatory Visit: Payer: Medicaid Other | Admitting: Occupational Therapy

## 2019-06-06 DIAGNOSIS — M542 Cervicalgia: Secondary | ICD-10-CM | POA: Diagnosis not present

## 2019-06-06 DIAGNOSIS — R41844 Frontal lobe and executive function deficit: Secondary | ICD-10-CM
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
## 2019-06-07 ENCOUNTER — Ambulatory Visit: Payer: Medicaid Other

## 2019-06-12 ENCOUNTER — Ambulatory Visit: Payer: Medicaid Other

## 2019-06-12 ENCOUNTER — Other Ambulatory Visit: Payer: Self-pay

## 2019-06-12 ENCOUNTER — Ambulatory Visit: Payer: Medicaid Other | Admitting: Physical Therapy

## 2019-06-12 DIAGNOSIS — R41841 Cognitive communication deficit: Secondary | ICD-10-CM

## 2019-06-12 DIAGNOSIS — M6281 Muscle weakness (generalized): Secondary | ICD-10-CM

## 2019-06-12 DIAGNOSIS — R2689 Other abnormalities of gait and mobility: Secondary | ICD-10-CM

## 2019-06-12 DIAGNOSIS — M542 Cervicalgia: Secondary | ICD-10-CM | POA: Diagnosis not present

## 2019-06-12 DIAGNOSIS — M79605 Pain in left leg: Secondary | ICD-10-CM
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
## 2019-06-19 ENCOUNTER — Other Ambulatory Visit: Payer: Self-pay

## 2019-06-19 ENCOUNTER — Ambulatory Visit: Payer: Medicaid Other | Admitting: Physical Therapy

## 2019-06-19 DIAGNOSIS — M542 Cervicalgia: Secondary | ICD-10-CM

## 2019-06-19 DIAGNOSIS — M79605 Pain in left leg: Secondary | ICD-10-CM

## 2019-06-19 DIAGNOSIS — M6281 Muscle weakness (generalized): Secondary | ICD-10-CM

## 2019-06-19 DIAGNOSIS — R2689 Other abnormalities of gait and mobility: Secondary | ICD-10-CM
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
## 2019-06-22 ENCOUNTER — Other Ambulatory Visit: Payer: Self-pay | Admitting: Student

## 2019-06-22 DIAGNOSIS — F1010 Alcohol abuse, uncomplicated: Secondary | ICD-10-CM

## 2019-06-22 DIAGNOSIS — Z9181 History of falling: Secondary | ICD-10-CM

## 2019-06-22 DIAGNOSIS — S065X0D Traumatic subdural hemorrhage without loss of consciousness, subsequent encounter: Secondary | ICD-10-CM

## 2019-06-22 DIAGNOSIS — S2243XD Multiple fractures of ribs, bilateral, subsequent encounter for fracture with routine healing: Secondary | ICD-10-CM

## 2019-06-22 DIAGNOSIS — Z86718 Personal history of other venous thrombosis and embolism: Secondary | ICD-10-CM

## 2019-06-22 DIAGNOSIS — S7222XD Displaced subtrochanteric fracture of left femur, subsequent encounter for closed fracture with routine healing: Secondary | ICD-10-CM

## 2019-06-22 DIAGNOSIS — Z79891 Long term (current) use of opiate analgesic: Secondary | ICD-10-CM

## 2019-06-22 DIAGNOSIS — R2689 Other abnormalities of gait and mobility: Secondary | ICD-10-CM | POA: Diagnosis not present

## 2019-06-22 DIAGNOSIS — R4189 Other symptoms and signs involving cognitive functions and awareness: Secondary | ICD-10-CM

## 2019-06-22 DIAGNOSIS — Z720 Tobacco use: Secondary | ICD-10-CM

## 2019-06-22 DIAGNOSIS — S12190D Other displaced fracture of second cervical vertebra, subsequent encounter for fracture with routine healing: Secondary | ICD-10-CM

## 2019-06-22 DIAGNOSIS — K5900 Constipation, unspecified: Secondary | ICD-10-CM

## 2019-06-22 DIAGNOSIS — S12191G Other nondisplaced fracture of second cervical vertebra, subsequent encounter for fracture with delayed healing: Secondary | ICD-10-CM

## 2019-06-26 ENCOUNTER — Ambulatory Visit: Payer: Medicaid Other | Admitting: Physical Therapy

## 2019-06-30 ENCOUNTER — Ambulatory Visit
Admission: RE | Admit: 2019-06-30 | Discharge: 2019-06-30 | Disposition: A | Discharge status: Home / Self Care | Payer: Medicaid Other | Source: Ambulatory Visit | Attending: Student | Admitting: Student

## 2019-06-30 DIAGNOSIS — S12191G Other nondisplaced fracture of second cervical vertebra, subsequent encounter for fracture with delayed healing: Secondary | ICD-10-CM

## 2019-07-12 ENCOUNTER — Other Ambulatory Visit: Payer: Self-pay

## 2019-07-12 ENCOUNTER — Encounter: Payer: Medicaid Other | Admitting: Physical Medicine & Rehabilitation

## 2019-09-13 ENCOUNTER — Encounter: Payer: Medicaid Other | Attending: Registered Nurse | Admitting: Physical Medicine & Rehabilitation

## 2019-09-13 DIAGNOSIS — F17210 Nicotine dependence, cigarettes, uncomplicated: Secondary | ICD-10-CM | POA: Insufficient documentation

## 2019-09-13 DIAGNOSIS — F319 Bipolar disorder, unspecified: Secondary | ICD-10-CM | POA: Insufficient documentation

## 2019-09-13 DIAGNOSIS — S7292XD Unspecified fracture of left femur, subsequent encounter for closed fracture with routine healing: Secondary | ICD-10-CM | POA: Insufficient documentation

## 2019-09-13 DIAGNOSIS — S069X9D Unspecified intracranial injury with loss of consciousness of unspecified duration, subsequent encounter: Secondary | ICD-10-CM | POA: Insufficient documentation

## 2019-09-13 DIAGNOSIS — F1010 Alcohol abuse, uncomplicated: Secondary | ICD-10-CM | POA: Insufficient documentation

## 2019-09-13 DIAGNOSIS — S12100D Unspecified displaced fracture of second cervical vertebra, subsequent encounter for fracture with routine healing: Secondary | ICD-10-CM | POA: Insufficient documentation

## 2019-12-08 IMAGING — CT CT CERVICAL SPINE WITHOUT CONTRAST
3 of 4 series · 12 of 33 positions shown, 14 images · non-contrast
Comparison: 04/04/2019.

CLINICAL DATA: Posttraumatic headache

EXAM:
CT HEAD WITHOUT CONTRAST
CT CERVICAL SPINE WITHOUT CONTRAST
TECHNIQUE: Multidetector CT imaging of the head and cervical spine was
performed following the standard protocol without intravenous
contrast. Multiplanar CT image reconstructions of the cervical spine
were also generated.

[Series 5: c_spine 2.0 st · axial · 0.27mm/px · z∈[-310,-180]mm · 4 of 99 slices shown, 5 images]
[im 17/99  soft-tissue]
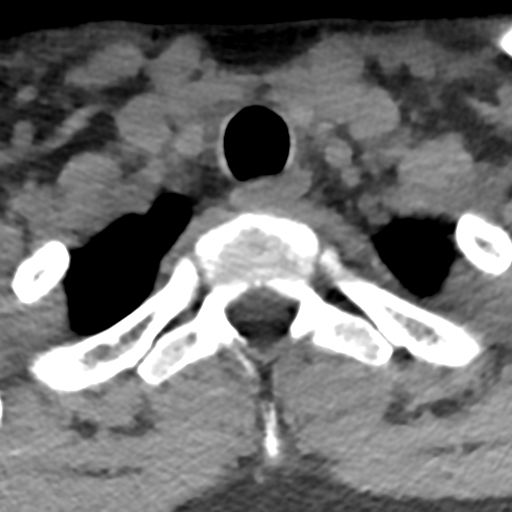
[im 17/99  bone]
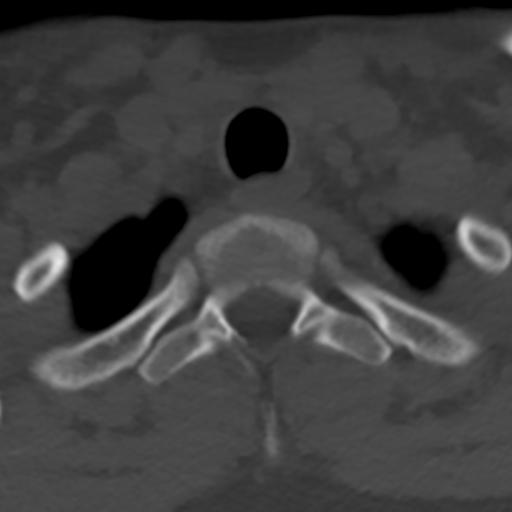
[im 33/99  bone]
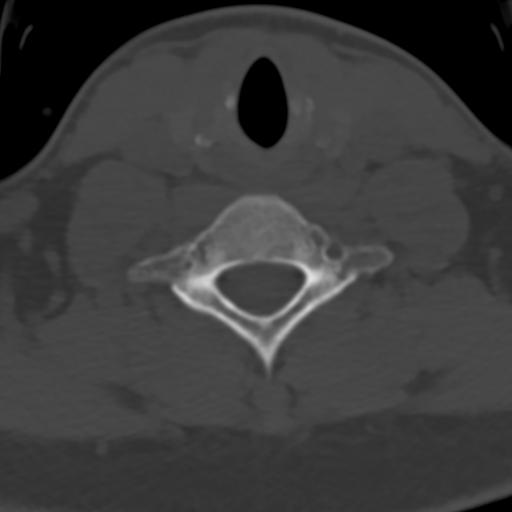
[im 66/99  bone]
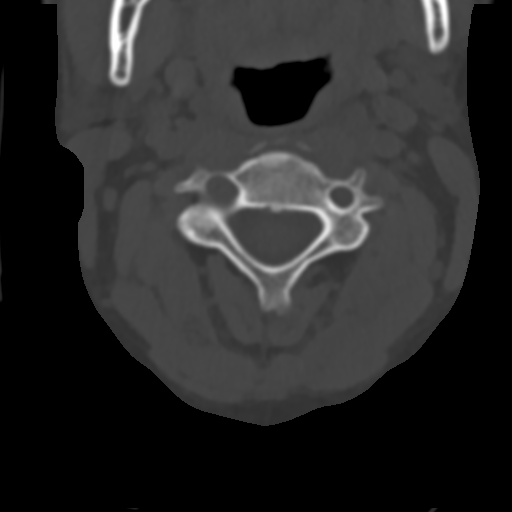
[im 82/99  bone]
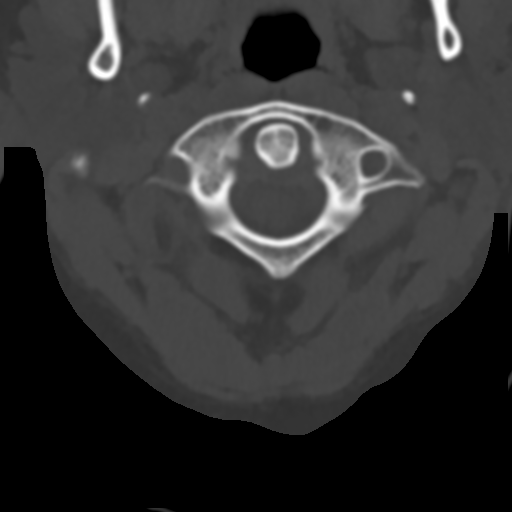

[Series 6: coronal bone · coronal · 0.23mm/px · 3 of 79 slices shown]
[im 16/79  bone]
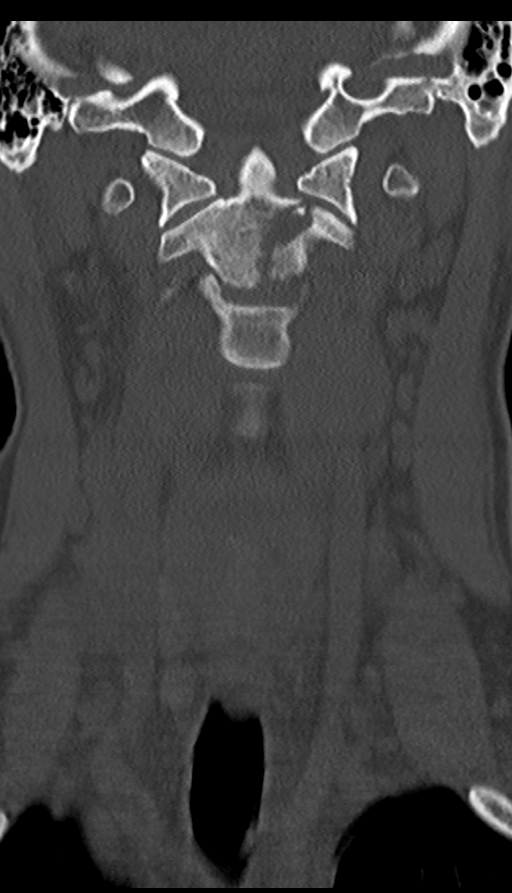
[im 32/79  bone]
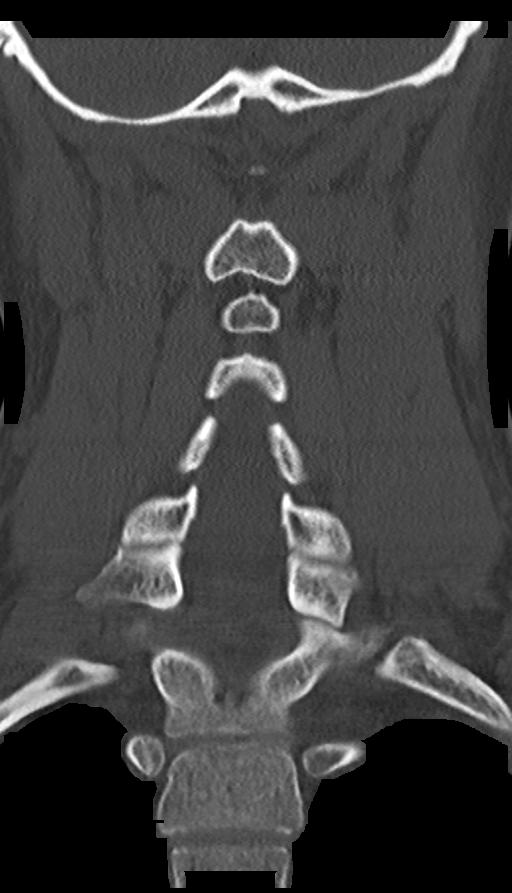
[im 47/79  bone]
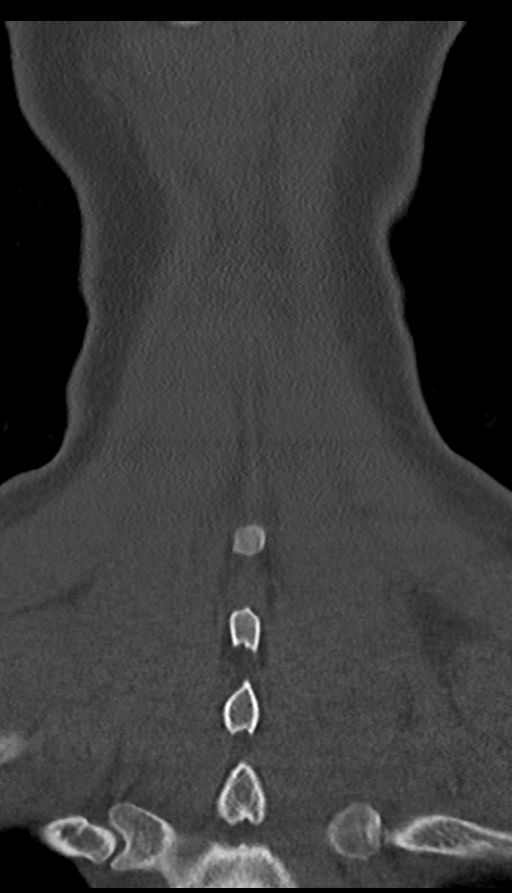

[Series 7: sagittal bone · sagittal · 0.30mm/px · 5 of 61 slices shown, 6 images]
[im 21/61  bone]
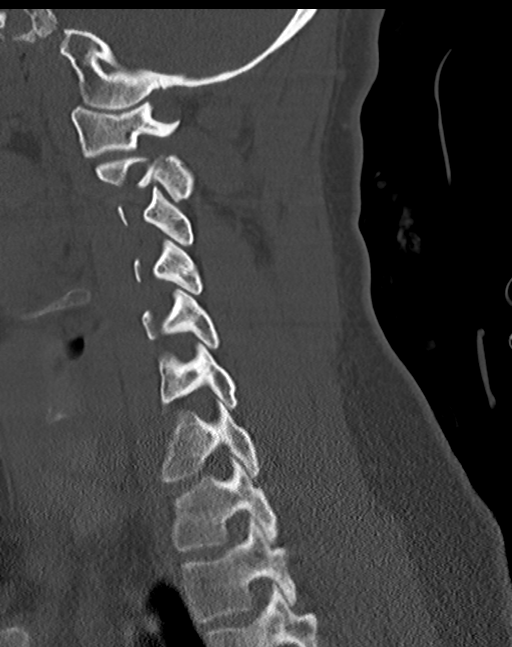
[im 26/61  bone]
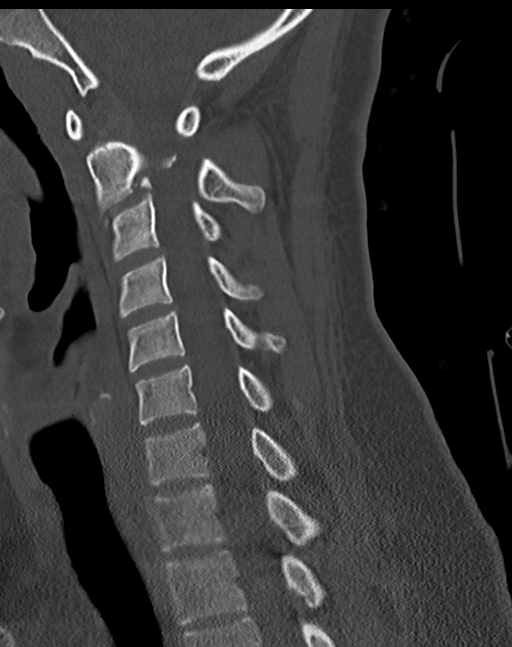
[im 31/61  soft-tissue]
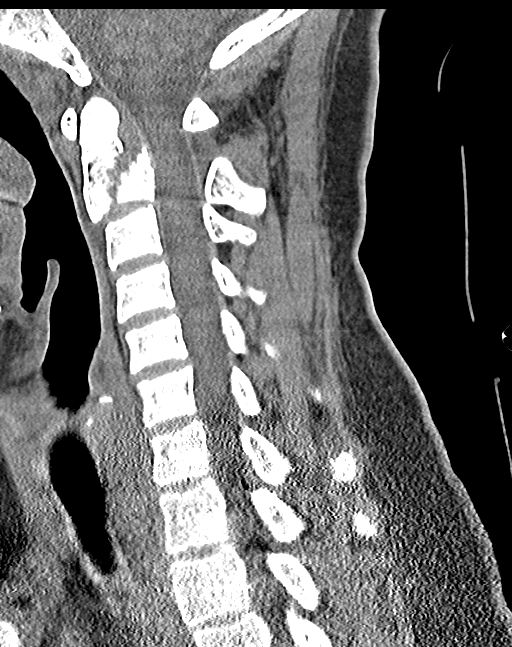
[im 31/61  bone]
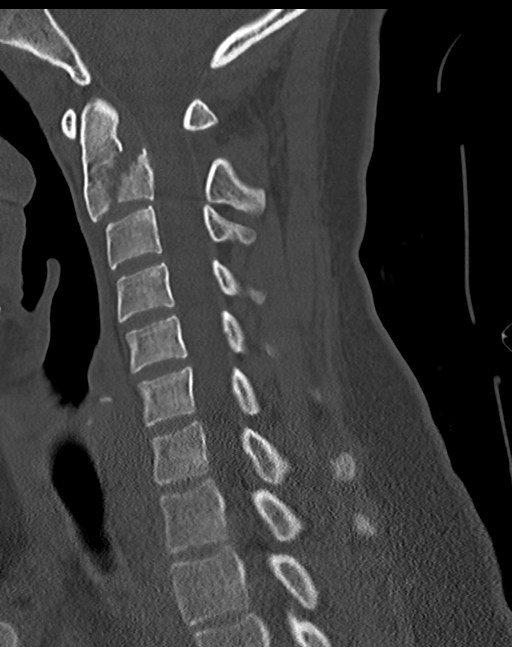
[im 36/61  bone]
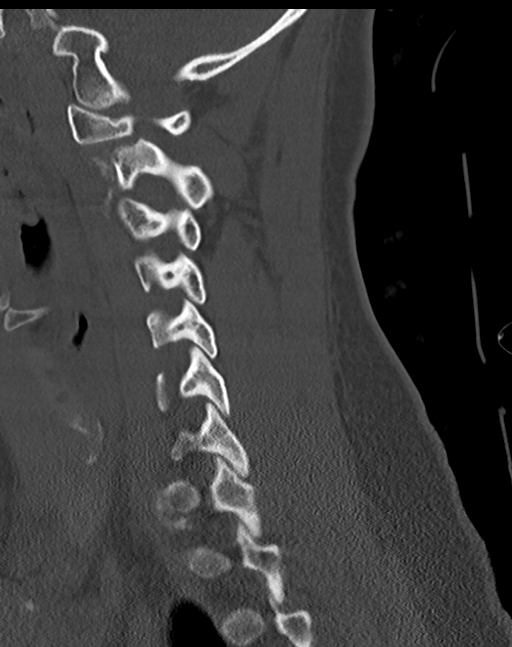
[im 41/61  bone]
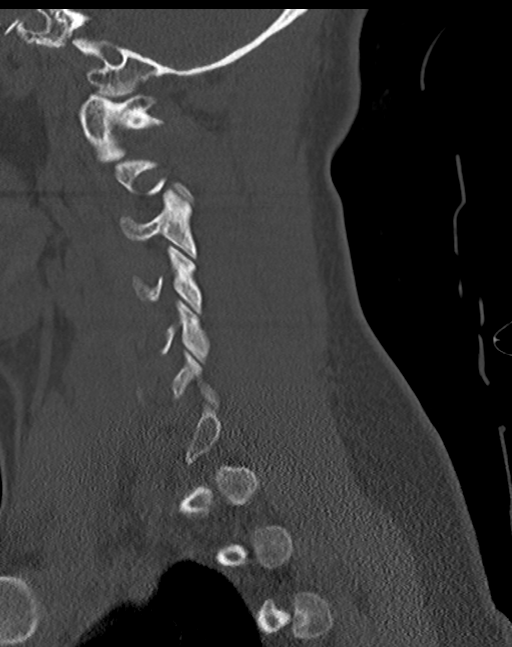

[12 of 33 positions shown; findings below may reference images not displayed]

FINDINGS: CT HEAD FINDINGS

Brain: There is no mass, hemorrhage or extra-axial collection. The
size and configuration of the ventricles and extra-axial CSF spaces
are normal. The brain parenchyma is normal, without evidence of
acute or chronic infarction.

Vascular: No abnormal hyperdensity of the major intracranial
arteries or dural venous sinuses. No intracranial atherosclerosis.

Skull: The visualized skull base, calvarium and extracranial soft
tissues are normal.

Sinuses/Orbits: No fluid levels or advanced mucosal thickening of
the visualized paranasal sinuses. No mastoid or middle ear effusion.
The orbits are normal.

CT CERVICAL SPINE FINDINGS

Alignment: No static subluxation. Facets are aligned. Occipital
condyles are normally positioned.

Skull base and vertebrae: Fracture of C2 is unchanged compared to
04/04/2019. the fracture involves the vertebral body and the right
lamina. No new fracture. There is no evidence of healing.

Soft tissues and spinal canal: No prevertebral fluid or swelling. No
visible canal hematoma.

Disc levels: No advanced spinal canal or neural foraminal stenosis.

Upper chest: No pneumothorax, pulmonary nodule or pleural effusion.

Other: Normal visualized paraspinal cervical soft tissues.
IMPRESSION: 1. Normal head CT.
2. Unchanged appearance of C2 fracture without evidence of healing.

## 2020-07-30 ENCOUNTER — Encounter (HOSPITAL_COMMUNITY): Payer: Self-pay | Admitting: Emergency Medicine

## 2020-07-30 ENCOUNTER — Emergency Department (HOSPITAL_COMMUNITY)
Admission: EM | Admit: 2020-07-30 | Discharge: 2020-07-30 | Payer: Medicaid Other | Attending: Emergency Medicine | Admitting: Emergency Medicine

## 2020-07-30 ENCOUNTER — Other Ambulatory Visit: Payer: Self-pay

## 2020-07-30 DIAGNOSIS — F17210 Nicotine dependence, cigarettes, uncomplicated: Secondary | ICD-10-CM | POA: Insufficient documentation

## 2020-07-30 DIAGNOSIS — R000 Tachycardia, unspecified: Secondary | ICD-10-CM | POA: Insufficient documentation

## 2020-07-30 DIAGNOSIS — F410 Panic disorder [episodic paroxysmal anxiety] without agoraphobia: Secondary | ICD-10-CM | POA: Diagnosis not present

## 2020-07-30 DIAGNOSIS — R4182 Altered mental status, unspecified: Secondary | ICD-10-CM | POA: Diagnosis present

## 2020-07-30 DIAGNOSIS — R410 Disorientation, unspecified: Secondary | ICD-10-CM | POA: Insufficient documentation

## 2020-07-30 DIAGNOSIS — F1910 Other psychoactive substance abuse, uncomplicated: Secondary | ICD-10-CM | POA: Insufficient documentation

## 2020-07-30 DIAGNOSIS — Z8659 Personal history of other mental and behavioral disorders: Secondary | ICD-10-CM

## 2020-07-30 DIAGNOSIS — I10 Essential (primary) hypertension: Secondary | ICD-10-CM | POA: Insufficient documentation

## 2020-07-30 MED ORDER — LORAZEPAM 2 MG/ML IJ SOLN
2.0000 mg | Freq: Once | INTRAMUSCULAR | Status: AC
  Administered 2020-07-30: 2 mg via INTRAMUSCULAR
  Filled 2020-07-30: qty 1

## 2020-07-30 NOTE — Discharge Instructions (Addendum)
Patient is medically cleared for jail.

## 2020-07-30 NOTE — ED Triage Notes (Addendum)
Patient is complaining of panic attack because he is going to jail. Patient is very rude. He states that when he get to the jail he will have a panic attack. 

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
## 2020-08-23 ENCOUNTER — Emergency Department (HOSPITAL_COMMUNITY)
Admission: EM | Admit: 2020-08-23 | Discharge: 2020-08-23 | Disposition: A | Discharge status: Home / Self Care | Payer: Medicaid Other | Attending: Emergency Medicine | Admitting: Emergency Medicine

## 2020-08-23 ENCOUNTER — Encounter (HOSPITAL_COMMUNITY): Payer: Self-pay | Admitting: Emergency Medicine

## 2020-08-23 ENCOUNTER — Emergency Department (HOSPITAL_COMMUNITY)
Admission: EM | Admit: 2020-08-23 | Discharge: 2020-08-23 | Payer: Medicaid Other | Source: Home / Self Care | Attending: Emergency Medicine | Admitting: Emergency Medicine

## 2020-08-23 ENCOUNTER — Emergency Department (HOSPITAL_COMMUNITY)
Admission: EM | Admit: 2020-08-23 | Discharge: 2020-08-24 | Disposition: A | Discharge status: Home / Self Care | Payer: Medicaid Other | Source: Home / Self Care | Attending: Emergency Medicine | Admitting: Emergency Medicine

## 2020-08-23 ENCOUNTER — Other Ambulatory Visit: Payer: Self-pay

## 2020-08-23 DIAGNOSIS — F600 Paranoid personality disorder: Secondary | ICD-10-CM | POA: Insufficient documentation

## 2020-08-23 DIAGNOSIS — F319 Bipolar disorder, unspecified: Secondary | ICD-10-CM | POA: Insufficient documentation

## 2020-08-23 DIAGNOSIS — Z20822 Contact with and (suspected) exposure to covid-19: Secondary | ICD-10-CM | POA: Insufficient documentation

## 2020-08-23 DIAGNOSIS — I10 Essential (primary) hypertension: Secondary | ICD-10-CM | POA: Insufficient documentation

## 2020-08-23 DIAGNOSIS — F17210 Nicotine dependence, cigarettes, uncomplicated: Secondary | ICD-10-CM | POA: Insufficient documentation

## 2020-08-23 DIAGNOSIS — F22 Delusional disorders: Secondary | ICD-10-CM | POA: Insufficient documentation

## 2020-08-23 DIAGNOSIS — F1924 Other psychoactive substance dependence with psychoactive substance-induced mood disorder: Secondary | ICD-10-CM | POA: Insufficient documentation

## 2020-08-23 DIAGNOSIS — Z0289 Encounter for other administrative examinations: Secondary | ICD-10-CM | POA: Insufficient documentation

## 2020-08-23 DIAGNOSIS — W228XXA Striking against or struck by other objects, initial encounter: Secondary | ICD-10-CM | POA: Insufficient documentation

## 2020-08-23 DIAGNOSIS — Z79899 Other long term (current) drug therapy: Secondary | ICD-10-CM | POA: Diagnosis not present

## 2020-08-23 DIAGNOSIS — F1514 Other stimulant abuse with stimulant-induced mood disorder: Secondary | ICD-10-CM | POA: Insufficient documentation

## 2020-08-23 DIAGNOSIS — Z23 Encounter for immunization: Secondary | ICD-10-CM | POA: Insufficient documentation

## 2020-08-23 DIAGNOSIS — R000 Tachycardia, unspecified: Secondary | ICD-10-CM | POA: Insufficient documentation

## 2020-08-23 DIAGNOSIS — R451 Restlessness and agitation: Secondary | ICD-10-CM | POA: Insufficient documentation

## 2020-08-23 DIAGNOSIS — F1994 Other psychoactive substance use, unspecified with psychoactive substance-induced mood disorder: Secondary | ICD-10-CM

## 2020-08-23 DIAGNOSIS — S61212A Laceration without foreign body of right middle finger without damage to nail, initial encounter: Secondary | ICD-10-CM | POA: Insufficient documentation

## 2020-08-23 DIAGNOSIS — S61511A Laceration without foreign body of right wrist, initial encounter: Secondary | ICD-10-CM | POA: Insufficient documentation

## 2020-08-23 DIAGNOSIS — Z5189 Encounter for other specified aftercare: Secondary | ICD-10-CM

## 2020-08-23 DIAGNOSIS — F1510 Other stimulant abuse, uncomplicated: Secondary | ICD-10-CM

## 2020-08-23 DIAGNOSIS — F919 Conduct disorder, unspecified: Secondary | ICD-10-CM

## 2020-08-23 LAB — CBC WITH DIFFERENTIAL/PLATELET
Abs Immature Granulocytes: 0.04 10*3/uL (ref 0.00–0.07)
Basophils Absolute: 0 10*3/uL (ref 0.0–0.1)
Basophils Relative: 0 %
Eosinophils Absolute: 0.1 10*3/uL (ref 0.0–0.5)
Eosinophils Relative: 1 %
HCT: 40.5 % (ref 39.0–52.0)
Hemoglobin: 14.1 g/dL (ref 13.0–17.0)
Immature Granulocytes: 0 %
Lymphocytes Relative: 12 %
Lymphs Abs: 1.2 10*3/uL (ref 0.7–4.0)
MCH: 32.8 pg (ref 26.0–34.0)
MCHC: 34.8 g/dL (ref 30.0–36.0)
MCV: 94.2 fL (ref 80.0–100.0)
Monocytes Absolute: 1 10*3/uL (ref 0.1–1.0)
Monocytes Relative: 9 %
Neutro Abs: 8.2 10*3/uL — ABNORMAL HIGH (ref 1.7–7.7)
Neutrophils Relative %: 78 %
Platelets: 210 10*3/uL (ref 150–400)
RBC: 4.3 MIL/uL (ref 4.22–5.81)
RDW: 11.8 % (ref 11.5–15.5)
WBC: 10.6 10*3/uL — ABNORMAL HIGH (ref 4.0–10.5)
nRBC: 0 % (ref 0.0–0.2)

## 2020-08-23 LAB — I-STAT CHEM 8, ED
BUN: 14 mg/dL (ref 6–20)
Calcium, Ion: 1.16 mmol/L (ref 1.15–1.40)
Chloride: 101 mmol/L (ref 98–111)
Creatinine, Ser: 1.2 mg/dL (ref 0.61–1.24)
Glucose, Bld: 82 mg/dL (ref 70–99)
HCT: 42 % (ref 39.0–52.0)
Hemoglobin: 14.3 g/dL (ref 13.0–17.0)
Potassium: 3.6 mmol/L (ref 3.5–5.1)
Sodium: 138 mmol/L (ref 135–145)
TCO2: 24 mmol/L (ref 22–32)

## 2020-08-23 LAB — COMPREHENSIVE METABOLIC PANEL
ALT: 19 U/L (ref 0–44)
AST: 32 U/L (ref 15–41)
Albumin: 4.9 g/dL (ref 3.5–5.0)
Alkaline Phosphatase: 61 U/L (ref 38–126)
Anion gap: 15 (ref 5–15)
BUN: 17 mg/dL (ref 6–20)
CO2: 20 mmol/L — ABNORMAL LOW (ref 22–32)
Calcium: 9.1 mg/dL (ref 8.9–10.3)
Chloride: 102 mmol/L (ref 98–111)
Creatinine, Ser: 1.15 mg/dL (ref 0.61–1.24)
GFR, Estimated: >60 mL/min (ref >60)
Glucose, Bld: 78 mg/dL (ref 70–99)
Potassium: 3.3 mmol/L — ABNORMAL LOW (ref 3.5–5.1)
Sodium: 137 mmol/L (ref 135–145)
Total Bilirubin: 2.9 mg/dL — ABNORMAL HIGH (ref 0.3–1.2)
Total Protein: 7.1 g/dL (ref 6.5–8.1)

## 2020-08-23 LAB — ETHANOL: Alcohol, Ethyl (B): <10 mg/dL (ref <10)

## 2020-08-23 MED ORDER — LORAZEPAM 2 MG/ML IJ SOLN
2.0000 mg | Freq: Once | INTRAMUSCULAR | Status: AC
  Administered 2020-08-23: 2 mg via INTRAMUSCULAR
  Filled 2020-08-23: qty 1

## 2020-08-23 MED ORDER — ZIPRASIDONE MESYLATE 20 MG IM SOLR
10.0000 mg | Freq: Once | INTRAMUSCULAR | Status: AC
  Administered 2020-08-23: 10 mg via INTRAMUSCULAR
  Filled 2020-08-23: qty 20

## 2020-08-23 MED ORDER — ACETAMINOPHEN 325 MG PO TABS
650.0000 mg | ORAL_TABLET | Freq: Once | ORAL | Status: AC
  Administered 2020-08-23: 650 mg via ORAL
  Filled 2020-08-23: qty 2

## 2020-08-23 MED ORDER — TETANUS-DIPHTH-ACELL PERTUSSIS 5-2.5-18.5 LF-MCG/0.5 IM SUSP
0.5000 mL | Freq: Once | INTRAMUSCULAR | Status: AC
  Administered 2020-08-23: 0.5 mL via INTRAMUSCULAR
  Filled 2020-08-23: qty 0.5

## 2020-08-23 MED ORDER — HALOPERIDOL LACTATE 5 MG/ML IJ SOLN
5.0000 mg | Freq: Once | INTRAMUSCULAR | Status: AC
  Administered 2020-08-23: 5 mg via INTRAMUSCULAR
  Filled 2020-08-23: qty 1

## 2020-08-23 MED ORDER — STERILE WATER FOR INJECTION IJ SOLN
INTRAMUSCULAR | Status: AC
  Filled 2020-08-23: qty 10
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
## 2020-08-23 NOTE — ED Notes (Signed)
Unable to acquire blood pressure, patient is unable to stay still.

## 2020-08-23 NOTE — ED Notes (Signed)
Pt erratic and extremely pressured speech. Pt sweating profusely. Geodon administered. Pt given juice and water.

## 2020-08-23 NOTE — [facility] Counselor (Signed)
Per Marlene Bast, RN pt received Geodon and is currently sleeping. RN to call clinician when pt's is alert and able to engage in TTS assessment.    Redmond Pulling, MS, [facility], [facility] Iv Triage Specialist [phone].
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
## 2020-08-23 NOTE — ED Triage Notes (Signed)
Pt brought in by EMS with [employer]. Pt brought due to lac on R wrist. Pt also delusional and erratic.

## 2020-08-23 NOTE — ED Notes (Signed)
MD at bedside, patient extremely agitated

## 2020-08-23 NOTE — ED Notes (Signed)
Patient will not let this RN dress laceration due to extreme aggression. Discharge papers given to [facility]

## 2020-08-23 NOTE — Discharge Instructions (Addendum)
The wrist laceration will not require any antibiotic ointment.  The tissue will eventually peel off on its own.  Continue to apply antibiotic ointment to the fingertip injury and change dressings daily.

## 2020-08-23 NOTE — Discharge Instructions (Addendum)
Follow up if needed

## 2020-08-23 NOTE — ED Triage Notes (Addendum)
Patient BIBA and [employer], was transported to [facility] after discharge for laceration. Patient became increasingly combative en route to jail. EMS called and patient given 5 mg Versed and put in 4 point restraints.
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
## 2020-08-24 DIAGNOSIS — F1994 Other psychoactive substance use, unspecified with psychoactive substance-induced mood disorder: Secondary | ICD-10-CM

## 2020-08-24 LAB — RESPIRATORY PANEL BY RT PCR (FLU A&B, COVID)
Influenza A by PCR: NEGATIVE
Influenza B by PCR: NEGATIVE
SARS Coronavirus 2 by RT PCR: NEGATIVE

## 2020-08-24 NOTE — ED Provider Notes (Signed)
9:15 AM-call received from psychiatry, Ms. Arlana Pouch.  She has medically cleared the patient for return to his incarceration.  9:45 AM-he continues to be sleepy after medications given for sedation, greater than 12 hours ago.  Versus probably related to combination of medication plus fatigue, and post substance abuse lethargy.  Will allow to sober, before transfer back to jail.   Mancel Bale, MD 08/24/20 [phone]

## 2020-08-24 NOTE — ED Notes (Signed)
Pt will not get alert enough to get out of bed.  He does respond to verbal prompts but only briefly.  This Clinical research associate asked him if he can walk and mummered yes, but was not able to make an effort to move. [employer] want that pt can walk under his own power or the jail will send him right back.  Will monitor closely for ability to walk under his own power and discharge at that time.

## 2020-08-24 NOTE — Discharge Instructions (Addendum)
Continue daily wound care as previously directed.  Follow-up with the doctor of your choice as needed for problems.

## 2020-08-24 NOTE — ED Notes (Signed)
Pt is awake and eating breakfast.

## 2020-08-24 NOTE — ED Notes (Signed)
Pt discharged to the custody of [employer]. Pt would not allow VS because they were done recently. Pt was otherwise cooperative. He was able to walk under his own power.

## 2020-08-24 NOTE — ED Notes (Signed)
Patient given soda and blanket.

## 2020-08-24 NOTE — Consult Note (Signed)
[facility] Psych ED Discharge  08/24/2020 8:49 AM Jeremy [name]  MRN:  [id number] Principal Problem: Substance induced mood disorder [facility]) Discharge Diagnoses: Principal Problem:   Substance induced mood disorder (HCC)   Subjective: Patient states "I went to jail last night because I broke the law." Patient noted to be currently handcuffed.  Per attending RN police at bedside. Patient alert and oriented, participates actively in assessment.  Patient cooperative with assessment and answers appropriately. Patient reports trigger for acting out behavior last night with alcohol and substance use.  Patient reports he uses alcohol "anytime next day."  Patient reports substance use including marijuana, states "I use as much as I can."  Patient also endorses "I popped pills."  Patient does not elaborate further on type of substances use or frequency. Patient reports he is currently homeless in [place].  Patient denies access to weapons.  Patient is not employed.  Patient reports he does have some family and friends in the area.  Patient endorses average sleep and appetite. Patient assessed by nurse practitioner.  Patient denies suicidal ideations.  Patient denies any history of suicide attempts, denies self-harm behaviors.  Patient denies homicidal ideations.  Patient denies auditory and visual hallucinations.  There is no evidence of delusional thought content and patient does not appear to be responding to internal stimuli. Patient denies any outpatient psychiatric treatment.  Patient denies any history of mental health diagnoses.  Patient reports he plans to return to jail once discharged from the hospital.  Patient offered support and encouragement.  Total Time spent with patient: 20 minutes  Past Psychiatric History: Alcohol use disorder, bipolar 1 disorder  Past Medical History:  Past Medical History:  Diagnosis Date  . Bipolar 1 disorder [facility])     Past Surgical History:  Procedure  Laterality Date  . APPLICATION OF WOUND VAC N/A 02/20/2019   Procedure: CHANGE OUT OF WOUND VAC;  Surgeon: Violeta Gelinas, MD;  Location: [facility] OR;  Service: General;  Laterality: N/A;  . ARTERY REPAIR Left 06/28/2014   Procedure: BRACHIAL ARTERY EXPLORATION;  Surgeon: Nada Libman, MD;  Location: [facility] OR;  Service: Vascular;  Laterality: Left;  . DIAGNOSTIC LAPAROSCOPY    . FEMUR IM NAIL Left 04/05/2019   Procedure: INTRAMEDULLARY (IM) NAIL FEMORAL;  Surgeon: Roby Lofts, MD;  Location: [facility] OR;  Service: Orthopedics;  Laterality: Left;  . LAPAROTOMY N/A 02/18/2019   Procedure: EXPLORATORY LAPAROTOMY LIVER LACERATION REPAIR, APPLICATION WOUND VAC.;  Surgeon: Harriette Bouillon, MD;  Location: [facility] OR;  Service: General;  Laterality: N/A;  5 LAPS LEFT IN ABDOMEN  . LAPAROTOMY N/A 02/20/2019   Procedure: EXPLORATORY LAPAROTOMY;  Surgeon: Violeta Gelinas, MD;  [facility] OR;  Service: General;  Laterality: N/A;  . LAPAROTOMY N/A 02/22/2019   Procedure: EXPLORATORY LAPAROTOMY WITH CLOSURE;  Surgeon: Violeta Gelinas, MD;  Location: [facility] OR;  Service: General;  Laterality: N/A;   Family History: No family history on file. Family Psychiatric  History: None reported Social History:  Social History   Substance and Sexual Activity  Alcohol Use Yes  . Alcohol/week: 4.0 standard drinks  . Types: 4 Cans of beer per week     Social History   Substance and Sexual Activity  Drug Use Never    Social History   Socioeconomic History  . Marital status: Single    Spouse name: Not on file  . Number of children: Not on file  . Years of education: Not on file  . Highest education level: Not on  file  Occupational History  . Occupation: unemployed  Tobacco Use  . Smoking status: Current Every Day Smoker    Packs/day: 0.50    Types: Cigarettes  . Smokeless tobacco: Never Used  Vaping Use  . Vaping Use: Former  . Substances: Flavoring  Substance and Sexual Activity  . Alcohol use: Yes    Alcohol/week:  4.0 standard drinks    Types: 4 Cans of beer per week  . Drug use: Never  . Sexual activity: Not on file  Other Topics Concern  . Not on file  Social History Narrative   ** Merged History Encounter **       ** Merged History Encounter **       ** Merged History Encounter **       Social Determinants of Corporate investment banker Strain:   . Difficulty of Paying Living Expenses: Not on file  Food Insecurity:   . Worried About Programme researcher, broadcasting/film/video in the Last Year: Not on file  . Ran Out of Food in the Last Year: Not on file  Transportation Needs:   . Lack of Transportation (Medical): Not on file  . Lack of Transportation (Non-Medical): Not on file  Physical Activity:   . Days of Exercise per Week: Not on file  . Minutes of Exercise per Session: Not on file  Stress:   . Feeling of Stress : Not on file  Social Connections:   . Frequency of Communication with Friends and Family: Not on file  . Frequency of Social Gatherings with Friends and Family: Not on file  . Attends Religious Services: Not on file  . Active Member of Clubs or Organizations: Not on file  . Attends Banker Meetings: Not on file  . Marital Status: Not on file    Has this patient used any form of tobacco in the last 30 days? (Cigarettes, Smokeless Tobacco, Cigars, and/or Pipes) A prescription for an FDA-approved tobacco cessation medication was offered at discharge and the patient refused  Current Medications: No current facility-administered medications for this encounter.   Current Outpatient Medications  Medication Sig Dispense Refill  . acetaminophen (TYLENOL) 325 MG tablet Take 2 tablets (650 mg total) by mouth every 4 (four) hours as needed for mild pain. (Patient not taking: Reported on 06/06/2019)    . ibuprofen (ADVIL) 200 MG tablet Take 2 tablets (400 mg total) by mouth every 8 (eight) hours as needed for mild pain (for headaches). (Patient not taking: Reported on 06/06/2019) 30 tablet 0   . methocarbamol (ROBAXIN) 750 MG tablet Take 1 tablet (750 mg total) by mouth 3 (three) times daily. (Patient not taking: Reported on 05/08/2019) 90 tablet 0  . propranolol (INDERAL) 20 MG tablet Take 1 tablet (20 mg total) by mouth 3 (three) times daily. 1 tablet p.o. 3 times daily (Patient not taking: Reported on 06/06/2019) 90 tablet 0  . QUEtiapine (SEROQUEL) 100 MG tablet Take 1 tablet (100 mg total) by mouth at bedtime. (Patient not taking: Reported on 06/06/2019) 30 tablet 0  . QUEtiapine (SEROQUEL) 50 MG tablet Take 1 tablet (50 mg total) by mouth daily at 6 (six) AM. (Patient not taking: Reported on 06/06/2019) 30 tablet 0  . senna-docusate (SENOKOT-S) 8.6-50 MG tablet Take 2 tablets by mouth at bedtime. (Patient not taking: Reported on 06/06/2019)    . topiramate (TOPAMAX) 25 MG tablet Take 1 tablet (25 mg total) by mouth at bedtime. (Patient not taking: Reported on 06/06/2019) 30 tablet  0  . traMADol (ULTRAM) 50 MG tablet Take 1 tablet (50 mg total) by mouth 2 (two) times daily as needed. (Patient not taking: Reported on 06/06/2019) 60 tablet 1  . traZODone (DESYREL) 50 MG tablet Take 1 tablet (50 mg total) by mouth at bedtime. (Patient not taking: Reported on 05/08/2019) 20 tablet 0   PTA Medications: (Not in a hospital admission)   Musculoskeletal: Strength & Muscle Tone: within normal limits Gait & Station: normal Patient leans: N/A  Psychiatric Specialty Exam: Physical Exam  Review of Systems  Blood pressure 128/74, pulse 91, temperature 98.3 F (36.8 C), temperature source Oral, resp. rate 16, height 5\' 9"  (1.753 m), weight 86.2 kg, SpO2 100 %.Body mass index is 28.06 kg/m.  General Appearance: Fairly Groomed  Eye Contact:  Fair  Speech:  Clear and Coherent and Normal Rate  Volume:  Normal  Mood:  Irritable  Affect:  Congruent  Thought Process:  Coherent, Goal Directed and Descriptions of Associations: Intact  Orientation:  Full (Time, Place, and Person)  Thought Content:  Logical   Suicidal Thoughts:  No  Homicidal Thoughts:  No  Memory:  Immediate;   Fair Recent;   Fair Remote;   Fair    Judgement:  Fair  Insight:  Fair  Psychomotor Activity:  Normal  Concentration:  Concentration: Fair and Attention Span: Fair  Recall:  of Knowledge:  Fair  Language:  Fair  Akathisia:  No  Handed:  Right  AIMS (if indicated):     Assets:  Physical Health Resilience Social Support  ADL's:  Intact  Cognition:  WNL  Sleep:        Demographic Factors:  Male  Loss Factors: Legal issues  Historical Factors: NA  Risk Reduction Factors:   Positive social support, Positive therapeutic relationship and Positive coping skills or problem solving skills  Continued Clinical Symptoms:  Alcohol/Substance Abuse/Dependencies  Cognitive Features That Contribute To Risk:  None    Suicide Risk:  Minimal: No identifiable suicidal ideation.  Patients presenting with no risk factors but with morbid ruminations; may be classified as minimal risk based on the severity of the depressive symptoms    Plan Of Care/Follow-up recommendations:  Other:  Patient reviewed with Dr. Fiserv.  Follow-up with outpatient psychiatry and substance use treatment resources provided.  Disposition: Patient cleared by psychiatry. Nelly Rout, FNP 08/24/2020, 8:49 AM

## 2020-08-24 NOTE — BH Assessment (Signed)
Clinician called and spoke to Ford Cliff, RN and noted the pt is still sleeping and unable to engage in TTS assessment.   Redmond Pulling, MS, [facility], [facility] Triage Specialist [phone].

## 2020-08-24 NOTE — ED Notes (Signed)
Patient still in police custody in cuffs, sleeping, in no distress.
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
## 2021-06-17 ENCOUNTER — Other Ambulatory Visit: Payer: Self-pay

## 2021-06-17 ENCOUNTER — Emergency Department (HOSPITAL_COMMUNITY)
Admission: EM | Admit: 2021-06-17 | Discharge: 2021-06-17 | Disposition: A | Discharge status: Home / Self Care | Payer: Medicaid Other | Attending: Student | Admitting: Student

## 2021-06-17 ENCOUNTER — Encounter (HOSPITAL_COMMUNITY): Payer: Self-pay

## 2021-06-17 ENCOUNTER — Emergency Department (HOSPITAL_COMMUNITY)
Admission: EM | Admit: 2021-06-17 | Discharge: 2021-06-18 | Disposition: A | Discharge status: Home / Self Care | Payer: Medicaid Other | Source: Home / Self Care | Attending: Emergency Medicine | Admitting: Emergency Medicine

## 2021-06-17 DIAGNOSIS — F17210 Nicotine dependence, cigarettes, uncomplicated: Secondary | ICD-10-CM | POA: Insufficient documentation

## 2021-06-17 DIAGNOSIS — Z7189 Other specified counseling: Secondary | ICD-10-CM | POA: Insufficient documentation

## 2021-06-17 DIAGNOSIS — S90822A Blister (nonthermal), left foot, initial encounter: Secondary | ICD-10-CM | POA: Diagnosis not present

## 2021-06-17 DIAGNOSIS — Z79899 Other long term (current) drug therapy: Secondary | ICD-10-CM | POA: Diagnosis not present

## 2021-06-17 DIAGNOSIS — X58XXXA Exposure to other specified factors, initial encounter: Secondary | ICD-10-CM | POA: Diagnosis not present

## 2021-06-17 DIAGNOSIS — S90821A Blister (nonthermal), right foot, initial encounter: Secondary | ICD-10-CM | POA: Insufficient documentation

## 2021-06-17 DIAGNOSIS — M79671 Pain in right foot: Secondary | ICD-10-CM

## 2021-06-17 DIAGNOSIS — Z7689 Persons encountering health services in other specified circumstances: Secondary | ICD-10-CM

## 2021-06-17 DIAGNOSIS — M79672 Pain in left foot: Secondary | ICD-10-CM

## 2021-06-17 DIAGNOSIS — I10 Essential (primary) hypertension: Secondary | ICD-10-CM | POA: Insufficient documentation

## 2021-06-17 NOTE — ED Triage Notes (Signed)
Pt arrived via walk in, c/o bilateral foot pain, blisters on feet.
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
## 2021-06-17 NOTE — ED Notes (Signed)
Pt ambulatory in ED lobby. 

## 2021-06-17 NOTE — Discharge Instructions (Addendum)
Please follow-up with social work and get a ride to where you are going.  You can take Tylenol ibuprofen as needed for the pain in your feet.  I also recommend wearing socks with your shoes.

## 2021-06-17 NOTE — ED Triage Notes (Signed)
Pt just discharged. Requesting to see SW.

## 2021-06-18 ENCOUNTER — Emergency Department (HOSPITAL_COMMUNITY)
Admission: EM | Admit: 2021-06-18 | Discharge: 2021-06-18 | Disposition: A | Discharge status: Home / Self Care | Payer: Medicaid Other | Attending: Emergency Medicine | Admitting: Emergency Medicine

## 2021-06-18 ENCOUNTER — Emergency Department (HOSPITAL_COMMUNITY)
Admission: EM | Admit: 2021-06-18 | Discharge: 2021-06-18 | Disposition: A | Discharge status: Home / Self Care | Payer: Medicaid Other | Source: Home / Self Care | Attending: Emergency Medicine | Admitting: Emergency Medicine

## 2021-06-18 ENCOUNTER — Other Ambulatory Visit: Payer: Self-pay

## 2021-06-18 ENCOUNTER — Encounter (HOSPITAL_COMMUNITY): Payer: Self-pay

## 2021-06-18 ENCOUNTER — Encounter (HOSPITAL_COMMUNITY): Payer: Self-pay | Admitting: Emergency Medicine

## 2021-06-18 DIAGNOSIS — I10 Essential (primary) hypertension: Secondary | ICD-10-CM | POA: Insufficient documentation

## 2021-06-18 DIAGNOSIS — Z5900 Homelessness unspecified: Secondary | ICD-10-CM | POA: Diagnosis not present

## 2021-06-18 DIAGNOSIS — Z7189 Other specified counseling: Secondary | ICD-10-CM | POA: Diagnosis not present

## 2021-06-18 DIAGNOSIS — M79671 Pain in right foot: Secondary | ICD-10-CM | POA: Diagnosis present

## 2021-06-18 DIAGNOSIS — M79672 Pain in left foot: Secondary | ICD-10-CM | POA: Insufficient documentation

## 2021-06-18 DIAGNOSIS — Z7689 Persons encountering health services in other specified circumstances: Secondary | ICD-10-CM

## 2021-06-18 DIAGNOSIS — F17210 Nicotine dependence, cigarettes, uncomplicated: Secondary | ICD-10-CM | POA: Insufficient documentation

## 2021-06-18 NOTE — Discharge Instructions (Addendum)
You were evaluated in the Emergency Department and after careful evaluation, we did not find any emergent condition requiring admission or further testing in the hospital.  Your exam/testing today was overall reassuring.  Please return to the Emergency Department if you experience any worsening of your condition.  Thank you for allowing us to be a part of your care.  

## 2021-06-18 NOTE — ED Notes (Signed)
Pt ambulatory in ED lobby and triage. 
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
## 2021-06-18 NOTE — Discharge Instructions (Signed)
Thank you for allowing me to care for you today in the Emergency Department.   I am providing you with the resource guide for [place].  Please follow-up using the attached list.  You can take over-the-counter pain medication, such as ibuprofen and Tylenol for pain in your feet.  Return to the emergency department for new or worsening symptoms.

## 2021-06-18 NOTE — ED Notes (Signed)
Pt angered by discharge.  Pt refused VS, refused to e-sign.  Resource guide given to pt for transportation and housing.

## 2021-06-18 NOTE — ED Triage Notes (Signed)
Pt request a third evaluation.

## 2021-06-18 NOTE — ED Provider Notes (Signed)
[facility]-EMERGENCY DEPT [facility] Emergency Department Provider Note MRN:  [id number]  Arrival date & time: 06/18/21     Chief Complaint   Homeless   History of Present Illness   Jeremy [name] is a 25 y.o. year-old male with a history of bipolar disorder presenting to the ED with chief complaint of foot pain.  Patient recently released from jail, had to walk an extended distance and now has bilateral foot pain.  This is now his third ED visit over the past few hours, requesting social work evaluation, requesting help with his foot pain.  Denies any other significant injuries or complaints, no fever.  Review of Systems  A problem-focused ROS was performed. Positive for foot pain.  Patient denies fever.  Patient's Health History    Past Medical History:  Diagnosis Date   Bipolar 1 disorder [facility])     Past Surgical History:  Procedure Laterality Date   APPLICATION OF WOUND VAC N/A 02/20/2019   Procedure: CHANGE OUT OF WOUND VAC;  Surgeon: Violeta Gelinas, MD;  Location: [facility] OR;  Service: General;  Laterality: N/A;   ARTERY REPAIR Left 06/28/2014   Procedure: BRACHIAL ARTERY EXPLORATION;  Surgeon: Nada Libman, MD;  Location: [facility] OR;  Service: Vascular;  Laterality: Left;   DIAGNOSTIC LAPAROSCOPY     FEMUR IM NAIL Left 04/05/2019   Procedure: INTRAMEDULLARY (IM) NAIL FEMORAL;  Surgeon: Roby Lofts, MD;  Location: [facility] OR;  Service: Orthopedics;  Laterality: Left;   LAPAROTOMY N/A 02/18/2019   Procedure: EXPLORATORY LAPAROTOMY LIVER LACERATION REPAIR, APPLICATION WOUND VAC.;  Surgeon: Harriette Bouillon, MD;  Location: [facility] OR;  Service: General;  Laterality: N/A;  5 LAPS LEFT IN ABDOMEN   LAPAROTOMY N/A 02/20/2019   Procedure: EXPLORATORY LAPAROTOMY;  Surgeon: Violeta Gelinas, MD;  Location: [facility] OR;  Service: General;  Laterality: N/A;   LAPAROTOMY N/A 02/22/2019   Procedure: EXPLORATORY LAPAROTOMY WITH CLOSURE;  Surgeon: Violeta Gelinas, MD;  Location: [facility] OR;  Service: General;   Laterality: N/A;    No family history on file.  Social History   Socioeconomic History   Marital status: Single    Spouse name: Not on file   Number of children: Not on file   Years of education: Not on file   Highest education level: Not on file  Occupational History   Occupation: unemployed  Tobacco Use   Smoking status: Every Day    Packs/day: 0.50    Types: Cigarettes   Smokeless tobacco: Never  Vaping Use   Vaping Use: Former   Substances: Flavoring  Substance and Sexual Activity   Alcohol use: Yes    Alcohol/week: 4.0 standard drinks    Types: 4 Cans of beer per week   Drug use: Never   Sexual activity: Not on file  Other Topics Concern   Not on file  Social History Narrative   ** Merged History Encounter **       ** Merged History Encounter **       ** Merged History Encounter **       Social Determinants of Corporate investment banker Strain: Not on file  Food Insecurity: Not on file  Transportation Needs: Not on file  Physical Activity: Not on file  Stress: Not on file  Social Connections: Not on file  Intimate Partner Violence: Not on file     Physical Exam   Vitals:   06/18/21 0037  BP: 120/74  Pulse: 65  Resp: 16  Temp: 98.7 F (37.1  C)  SpO2: 99%    CONSTITUTIONAL: Well-appearing, NAD NEURO:  Alert and oriented x 3, no focal deficits EYES:  eyes equal and reactive ENT/NECK:  no LAD, no JVD CARDIO: Regular rate, well-perfused, normal S1 and S2 PULM:  CTAB no wheezing or rhonchi GI/GU:  normal bowel sounds, non-distended, non-tender MSK/SPINE:  No gross deformities, no edema SKIN:  no rash, atraumatic PSYCH:  Appropriate speech and behavior  *Additional and/or pertinent findings included in MDM below  Diagnostic and Interventional Summary    EKG Interpretation  Date/Time:    Ventricular Rate:    PR Interval:    QRS Duration:   QT Interval:    QTC Calculation:   R Axis:     Text Interpretation:         Labs Reviewed -  No data to display  No orders to display    Medications - No data to display   Procedures  /  Critical Care Procedures  ED Course and Medical Decision Making  I have reviewed the triage vital signs, the nursing notes, and pertinent available records from the EMR.  Listed above are laboratory and imaging tests that I personally ordered, reviewed, and interpreted and then considered in my medical decision making (see below for details).  Feet appear normal, no redness, no reduced range of motion, normal cap refill and pulses.  Patient is able to ambulate but he describes the pain is excruciating.  Suspect this is mostly a visit for housing as patient is homeless.  Explained to him that this is not the function of the emergency department.  Appropriate for discharge.       Elmer Sow. Pilar Plate, MD [facility] Emergency Medicine [facility] [email or web address]  Final Clinical Impressions(s) / ED Diagnoses     ICD-10-CM   1. Pain in both feet  M79.671    M79.672       ED Discharge Orders     None        Discharge Instructions Discussed with and Provided to Patient:    Discharge Instructions      You were evaluated in the Emergency Department and after careful evaluation, we did not find any emergent condition requiring admission or further testing in the hospital.  Your exam/testing today was overall reassuring.  Please return to the Emergency Department if you experience any worsening of your condition.  Thank you for allowing [place] to be a part of your care.        Sabas Sous, MD 06/18/21 [phone]

## 2021-06-18 NOTE — ED Triage Notes (Signed)
Patient requesting assistance with housing from social work. Denies medical complaints.
